# Patient Record
Sex: Male | Born: 1960
Health system: Southern US, Community
[De-identification: ages and names within clinical notes are randomized; demographics above are authoritative.]

## PROBLEM LIST (undated history)

## (undated) DIAGNOSIS — R7611 Nonspecific reaction to tuberculin skin test without active tuberculosis: Secondary | ICD-10-CM

## (undated) DIAGNOSIS — G4730 Sleep apnea, unspecified: Secondary | ICD-10-CM

## (undated) DIAGNOSIS — Z8739 Personal history of other diseases of the musculoskeletal system and connective tissue: Secondary | ICD-10-CM

## (undated) DIAGNOSIS — E7800 Pure hypercholesterolemia, unspecified: Secondary | ICD-10-CM

## (undated) DIAGNOSIS — E278 Other specified disorders of adrenal gland: Secondary | ICD-10-CM

## (undated) DIAGNOSIS — I4819 Other persistent atrial fibrillation: Secondary | ICD-10-CM

## (undated) DIAGNOSIS — I10 Essential (primary) hypertension: Secondary | ICD-10-CM

## (undated) DIAGNOSIS — J42 Unspecified chronic bronchitis: Secondary | ICD-10-CM

## (undated) DIAGNOSIS — N179 Acute kidney failure, unspecified: Secondary | ICD-10-CM

## (undated) DIAGNOSIS — J939 Pneumothorax, unspecified: Secondary | ICD-10-CM

## (undated) DIAGNOSIS — D649 Anemia, unspecified: Secondary | ICD-10-CM

## (undated) DIAGNOSIS — Z720 Tobacco use: Secondary | ICD-10-CM

## (undated) DIAGNOSIS — J439 Emphysema, unspecified: Secondary | ICD-10-CM

## (undated) DIAGNOSIS — I4891 Unspecified atrial fibrillation: Secondary | ICD-10-CM

## (undated) DIAGNOSIS — Z8719 Personal history of other diseases of the digestive system: Secondary | ICD-10-CM

## (undated) DIAGNOSIS — M1990 Unspecified osteoarthritis, unspecified site: Secondary | ICD-10-CM

## (undated) HISTORY — PX: CHEST TUBE INSERTION: SHX231

## (undated) HISTORY — DX: Other persistent atrial fibrillation: I48.19

## (undated) HISTORY — DX: Pure hypercholesterolemia, unspecified: E78.00

## (undated) HISTORY — PX: CARDIAC CATHETERIZATION: SHX172

## (undated) HISTORY — DX: Pneumothorax, unspecified: J93.9

## (undated) HISTORY — PX: ADRENAL GLAND SURGERY: SHX544

## (undated) HISTORY — DX: Other specified disorders of adrenal gland: E27.8

## (undated) HISTORY — PX: ESOPHAGOGASTRODUODENOSCOPY: SHX1529

## (undated) HISTORY — PX: FINGER SURGERY: SHX640

## (undated) HISTORY — DX: Unspecified atrial fibrillation: I48.91

## (undated) HISTORY — DX: Tobacco use: Z72.0

## (undated) HISTORY — DX: Sleep apnea, unspecified: G47.30

## (undated) HISTORY — DX: Unspecified osteoarthritis, unspecified site: M19.90

---
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
## 1994-12-08 HISTORY — PX: INGUINAL HERNIA REPAIR: SUR1180

## 2002-07-15 ENCOUNTER — Emergency Department (HOSPITAL_COMMUNITY): Admission: EM | Admit: 2002-07-15 | Discharge: 2002-07-15 | Payer: Self-pay | Admitting: Emergency Medicine

## 2016-02-21 ENCOUNTER — Emergency Department (HOSPITAL_COMMUNITY)
Admission: EM | Admit: 2016-02-21 | Discharge: 2016-02-21 | Disposition: A | Discharge status: Home / Self Care | Payer: BLUE CROSS/BLUE SHIELD | Attending: Emergency Medicine | Admitting: Emergency Medicine

## 2016-02-21 ENCOUNTER — Encounter (HOSPITAL_COMMUNITY): Payer: Self-pay | Admitting: Emergency Medicine

## 2016-02-21 ENCOUNTER — Observation Stay (HOSPITAL_BASED_OUTPATIENT_CLINIC_OR_DEPARTMENT_OTHER)
Admission: EM | Admit: 2016-02-21 | Discharge: 2016-02-24 | Disposition: A | Discharge status: Home / Self Care | Payer: BLUE CROSS/BLUE SHIELD | Source: Home / Self Care | Attending: Emergency Medicine | Admitting: Emergency Medicine

## 2016-02-21 ENCOUNTER — Encounter (HOSPITAL_COMMUNITY): Payer: Self-pay

## 2016-02-21 DIAGNOSIS — R040 Epistaxis: Secondary | ICD-10-CM

## 2016-02-21 DIAGNOSIS — Z7982 Long term (current) use of aspirin: Secondary | ICD-10-CM | POA: Insufficient documentation

## 2016-02-21 DIAGNOSIS — I10 Essential (primary) hypertension: Secondary | ICD-10-CM | POA: Diagnosis present

## 2016-02-21 DIAGNOSIS — E669 Obesity, unspecified: Secondary | ICD-10-CM | POA: Insufficient documentation

## 2016-02-21 DIAGNOSIS — M549 Dorsalgia, unspecified: Secondary | ICD-10-CM | POA: Insufficient documentation

## 2016-02-21 DIAGNOSIS — Z79899 Other long term (current) drug therapy: Secondary | ICD-10-CM | POA: Insufficient documentation

## 2016-02-21 DIAGNOSIS — R51 Headache: Secondary | ICD-10-CM | POA: Diagnosis not present

## 2016-02-21 DIAGNOSIS — IMO0001 Reserved for inherently not codable concepts without codable children: Secondary | ICD-10-CM

## 2016-02-21 DIAGNOSIS — F17210 Nicotine dependence, cigarettes, uncomplicated: Secondary | ICD-10-CM | POA: Insufficient documentation

## 2016-02-21 HISTORY — DX: Essential (primary) hypertension: I10

## 2016-02-21 LAB — CBC
HEMATOCRIT: 44.7 % (ref 39.0–52.0)
Hemoglobin: 14.5 g/dL (ref 13.0–17.0)
MCH: 28.7 pg (ref 26.0–34.0)
MCHC: 32.4 g/dL (ref 30.0–36.0)
MCV: 88.5 fL (ref 78.0–100.0)
Platelets: 245 10*3/uL (ref 150–400)
RBC: 5.05 MIL/uL (ref 4.22–5.81)
RDW: 14.1 % (ref 11.5–15.5)
WBC: 8.7 10*3/uL (ref 4.0–10.5)

## 2016-02-21 LAB — COMPREHENSIVE METABOLIC PANEL
ALBUMIN: 4.4 g/dL (ref 3.5–5.0)
ALT: 21 U/L (ref 17–63)
ANION GAP: 11 (ref 5–15)
AST: 22 U/L (ref 15–41)
Alkaline Phosphatase: 58 U/L (ref 38–126)
BILIRUBIN TOTAL: 0.8 mg/dL (ref 0.3–1.2)
BUN: 12 mg/dL (ref 6–20)
CO2: 25 mmol/L (ref 22–32)
Calcium: 9.5 mg/dL (ref 8.9–10.3)
Chloride: 103 mmol/L (ref 101–111)
Creatinine, Ser: 1 mg/dL (ref 0.61–1.24)
GFR calc non Af Amer: >60 mL/min (ref >60)
GLUCOSE: 93 mg/dL (ref 65–99)
POTASSIUM: 4 mmol/L (ref 3.5–5.1)
SODIUM: 139 mmol/L (ref 135–145)
TOTAL PROTEIN: 6.9 g/dL (ref 6.5–8.1)

## 2016-02-21 LAB — CBC WITH DIFFERENTIAL/PLATELET
BASOS ABS: 0.1 10*3/uL (ref 0.0–0.1)
Basophils Relative: 1 %
Eosinophils Absolute: 0.2 10*3/uL (ref 0.0–0.7)
Eosinophils Relative: 2 %
HEMATOCRIT: 43.3 % (ref 39.0–52.0)
HEMOGLOBIN: 14 g/dL (ref 13.0–17.0)
LYMPHS PCT: 26 %
Lymphs Abs: 3.1 10*3/uL (ref 0.7–4.0)
MCH: 28.2 pg (ref 26.0–34.0)
MCHC: 32.3 g/dL (ref 30.0–36.0)
MCV: 87.1 fL (ref 78.0–100.0)
MONO ABS: 0.7 10*3/uL (ref 0.1–1.0)
Monocytes Relative: 6 %
NEUTROS ABS: 7.8 10*3/uL — AB (ref 1.7–7.7)
NEUTROS PCT: 65 %
Platelets: 270 10*3/uL (ref 150–400)
RBC: 4.97 MIL/uL (ref 4.22–5.81)
RDW: 14.1 % (ref 11.5–15.5)
WBC: 11.9 10*3/uL — ABNORMAL HIGH (ref 4.0–10.5)

## 2016-02-21 LAB — PROTIME-INR
INR: 1.15 (ref 0.00–1.49)
Prothrombin Time: 14.9 seconds (ref 11.6–15.2)

## 2016-02-21 LAB — TYPE AND SCREEN
ABO/RH(D): O NEG
ANTIBODY SCREEN: NEGATIVE

## 2016-02-21 LAB — ABO/RH: ABO/RH(D): O NEG

## 2016-02-21 MED ORDER — LIDOCAINE HCL 2 % EX GEL
1.0000 "application " | Freq: Once | CUTANEOUS | Status: DC | PRN
  Filled 2016-02-21: qty 11

## 2016-02-21 MED ORDER — SILVER NITRATE-POT NITRATE 75-25 % EX MISC
1.0000 "application " | Freq: Once | CUTANEOUS | Status: DC
  Filled 2016-02-21: qty 1

## 2016-02-21 MED ORDER — LIDOCAINE HCL 4 % EX SOLN
0.0000 mL | Freq: Once | CUTANEOUS | Status: AC | PRN
  Administered 2016-02-21: 10 mL via TOPICAL
  Filled 2016-02-21: qty 50

## 2016-02-21 MED ORDER — LISINOPRIL 20 MG PO TABS
20.0000 mg | ORAL_TABLET | Freq: Once | ORAL | Status: AC
  Administered 2016-02-21: 20 mg via ORAL
  Filled 2016-02-21 (×2): qty 1

## 2016-02-21 MED ORDER — LIDOCAINE-EPINEPHRINE (PF) 1 %-1:200000 IJ SOLN
0.0000 mL | Freq: Once | INTRAMUSCULAR | Status: DC | PRN
  Filled 2016-02-21: qty 30

## 2016-02-21 MED ORDER — TRIPLE ANTIBIOTIC 3.5-400-5000 EX OINT
1.0000 "application " | TOPICAL_OINTMENT | Freq: Once | CUTANEOUS | Status: DC | PRN
  Filled 2016-02-21: qty 1

## 2016-02-21 MED ORDER — OXYMETAZOLINE HCL 0.05 % NA SOLN
1.0000 | Freq: Once | NASAL | Status: AC
  Administered 2016-02-21: 1 via NASAL
  Filled 2016-02-21: qty 15

## 2016-02-21 MED ORDER — HYDROCHLOROTHIAZIDE 12.5 MG PO CAPS
12.5000 mg | ORAL_CAPSULE | Freq: Once | ORAL | Status: AC
  Administered 2016-02-21: 12.5 mg via ORAL
  Filled 2016-02-21: qty 1

## 2016-02-21 MED ORDER — HYDROCHLOROTHIAZIDE 25 MG PO TABS: 25.0000 mg | ORAL_TABLET | Freq: Every day | ORAL | Status: DC

## 2016-02-21 MED ORDER — LISINOPRIL 40 MG PO TABS: 40.0000 mg | ORAL_TABLET | Freq: Every day | ORAL | Status: DC

## 2016-02-21 MED ORDER — OXYMETAZOLINE HCL 0.05 % NA SOLN: 1.0000 | Freq: Once | NASAL | Status: DC | PRN

## 2016-02-21 MED ORDER — SILVER NITRATE-POT NITRATE 75-25 % EX MISC
10.0000 | Freq: Once | CUTANEOUS | Status: DC | PRN
  Filled 2016-02-21: qty 10

## 2016-02-21 NOTE — ED Notes (Signed)
Pt. reports persistent epistaxis since 3 am this morning , denies nasal injury  or anticoagulant medication , treated at [facility] this afternoon with no relief.

## 2016-02-21 NOTE — Progress Notes (Signed)
Pt confirms he does not have a [facility] pcp  CM provided a list of [facility] pcps in network to assist with him obtaining a provider for f/u

## 2016-02-21 NOTE — ED Provider Notes (Signed)
CSN: [id number]     Arrival date & time 02/21/16  1225 History   First MD Initiated Contact with Patient 02/21/16 1252     Chief Complaint  Patient presents with  . Epistaxis     Patient is a 55 y.o. male presenting with nosebleeds. The history is provided by the patient.  Epistaxis  patient presents with nosebleed. Mostly on the left side but also goes down his throat and out the right side. Has been passing clots. Began last night was continued on off today. No real history of  Nosebleeds. No lightheadedness or dizziness. No difficulty breathing. He does have a history of hypertension. He has been out of his blood pressure medicines for month.  He has been taking aspirin for his back pain.  Past Medical History  Diagnosis Date  . Hypertension    Past Surgical History  Procedure Laterality Date  . Adrenal gland surgery     History reviewed. No pertinent family history. Social History  Substance Use Topics  . Smoking status: Current Every Day Smoker -- 1.00 packs/day    Types: Cigarettes  . Smokeless tobacco: None  . Alcohol Use: Yes    Review of Systems  Constitutional: Negative for appetite change.  HENT: Positive for nosebleeds.   Respiratory: Negative for shortness of breath.   Cardiovascular: Negative for chest pain.  Musculoskeletal: Positive for back pain.  Skin: Negative for wound.      Allergies  Review of patient's allergies indicates no known allergies.  Home Medications   Prior to Admission medications   Medication Sig Start Date End Date Taking? Authorizing Provider  Aspirin-Caffeine (BAYER BACK & BODY PAIN EX ST) 500-32.5 MG TABS Take 3 tablets by mouth daily.   Yes Historical Provider, MD  ECHINACEA PO Take 3 tablets by mouth daily as needed (immune system support.).   Yes Historical Provider, MD  hydrochlorothiazide (HYDRODIURIL) 25 MG tablet Take 1 tablet (25 mg total) by mouth daily. 02/21/16   Benjiman CoreNathan Keaundre Thelin, MD  lisinopril (PRINIVIL,ZESTRIL) 40  MG tablet Take 1 tablet (40 mg total) by mouth daily. 02/21/16   Benjiman CoreNathan Jakayden Cancio, MD   BP 161/109 mmHg  Pulse 66  Temp(Src) 98.4 F (36.9 C) (Oral)  Resp 18  SpO2 99% Physical Exam  Constitutional: He appears well-developed.  HENT:  Head: Atraumatic.   Patient with blood coming from bilateral nostrils and out of his left eye. Also some blood in posterior pharynx. Afrin placed after blowing out clots. No active bleeding seen.  Cardiovascular: Normal rate.   Pulmonary/Chest: Effort normal.  Abdominal: Soft. There is no tenderness.  Neurological: He is alert.  Skin: Skin is warm.    ED Course  .Epistaxis Management Date/Time: 02/21/2016 4:13 PM Performed by: Benjiman CorePICKERING, Nakoma Gotwalt Authorized by: Benjiman CorePICKERING, Haydee Jabbour Consent: Verbal consent obtained. Risks and benefits: risks, benefits and alternatives were discussed Patient understanding: patient states understanding of the procedure being performed Required items: required blood products, implants, devices, and special equipment available Patient sedated: no Treatment site: left anterior and left posterior Repair method: nasal balloon Post-procedure assessment: bleeding decreased Treatment complexity: complex Recurrence: recurrence of recent bleed Comments: Continued bleeding after placement of 7.5, 4.5 and then 9.5 cm AP ballons   (including critical care time) Labs Review Labs Reviewed  CBC    Imaging Review No results found. I have personally reviewed and evaluated these images and lab results as part of my medical decision-making.   EKG Interpretation None      MDM   Final  diagnoses:  Epistaxis  Essential hypertension    Patient with epistaxis. In left nostril. No visible site of bleeding after Afrin placed. Then 7.5 c rapid rhino in place. Continued bleeding anteriorly. Placed 7.4.5 centimeter anterior rapid Rhino. Then had more posterior and right-sided bleeding. This balloon was removed and a 9.5 sooner anterior  posterior balloon was placed. He continued to have some bleeding. Will discuss with ENT. Also hypertensive. Has been off his medications. Will now do oral antihypertensives and help to bring this down.    Benjiman Core, MD 02/21/16 [phone]

## 2016-02-21 NOTE — Consult Note (Signed)
  Reason for Consult: Recurrent epistaxis Referring Physician: No att. providers found  [name] is an 54 y.o. male.  HPI: 2 day history of chronic and recurring epistaxis from the left. He has been treated all day long in the emergency department without relief. It started on the left and was anterior at first. The initial treatment with a cottonball and Afrin was very effective. When he had that removed blood again so we had a balloon placed which did not seem to help. He is not on blood thinner. He does not use any nasal sprays. He does smoke 1 pack per day.  Past Medical History  Diagnosis Date  . Hypertension     Past Surgical History  Procedure Laterality Date  . Adrenal gland surgery      History reviewed. No pertinent family history.  Social History:  reports that he has been smoking Cigarettes.  He has been smoking about 1.00 pack per day. He does not have any smokeless tobacco history on file. He reports that he drinks alcohol. He reports that he does not use illicit drugs.  Allergies: No Known Allergies  Medications: Reviewed  Results for orders placed or performed during the hospital encounter of 02/21/16 (from the past 48 hour(s))  CBC     Status: None   Collection Time: 02/21/16  1:06 PM  Result Value Ref Range   WBC 8.7 4.0 - 10.5 K/uL   RBC 5.05 4.22 - 5.81 MIL/uL   Hemoglobin 14.5 13.0 - 17.0 g/dL   HCT 44.7 39.0 - 52.0 %   MCV 88.5 78.0 - 100.0 fL   MCH 28.7 26.0 - 34.0 pg   MCHC 32.4 30.0 - 36.0 g/dL   RDW 14.1 11.5 - 15.5 %   Platelets 245 150 - 400 K/uL    No results found.  ROS:Negative except as listed in admit H&P  Blood pressure 197/126, pulse 73, temperature 98.4 F (36.9 C), temperature source Oral, resp. rate 18, SpO2 99 %.  PHYSICAL EXAM: Overall appearance:  Healthy appearing, in no distress. A lot of dried blood over his shirt and some on his face. Head:  Normocephalic, atraumatic. Ears: External ears look healthy. Nose: External  nose is healthy in appearance. Internal nasal exam reveals bilateral cotton packing which was removed. Topical Afrin/Xylocaine was applied to the nasal cavities for anesthesia and decongestant. A thorough survey of the nasal cavities did not reveal any obvious granuloma or exposed vessels. There is no further bleeding once all of the clot was suctioned out. There is a slight inflamed area of the left anteroinferior septum which bled slightly with manipulation with a suction and this was cauterized with silver nitrate. A Slimline Merocel was then placed and inflated with the Afrin/Xylocaine solution. Oral Cavity/Pharynx:  There are no mucosal lesions or masses identified. Larynx/Hypopharynx: Deferred Neuro:  No identifiable neurologic deficits. Neck: No palpable neck masses.  Studies Reviewed: none  Procedures:  Nasal cautery, packing of left nasal cavity under local anesthetic. Procedure described above.  Assessment/Plan: Recurring epistaxis, bleeding site not identified. No further bleeding during my evaluation. Packing placed on the left. Follow-up in the office in 5 days for removal of packing or sooner if he has any further bleeding. Recommend he try to stop smoking.  Author [place] 02/21/2016, 6:35 PM      

## 2016-02-21 NOTE — Discharge Instructions (Signed)
Hypertension °Hypertension, commonly called high blood pressure, is when the force of blood pumping through your arteries is too strong. Your arteries are the blood vessels that carry blood from your heart throughout your body. A blood pressure reading consists of a higher number over a lower number, such as 110/72. The higher number (systolic) is the pressure inside your arteries when your heart pumps. The lower number (diastolic) is the pressure inside your arteries when your heart relaxes. Ideally you want your blood pressure below 120/80. °Hypertension forces your heart to work harder to pump blood. Your arteries may become narrow or stiff. Having untreated or uncontrolled hypertension can cause heart attack, stroke, kidney disease, and other problems. °RISK FACTORS °Some risk factors for high blood pressure are controllable. Others are not.  °Risk factors you cannot control include:  °· Race. You may be at higher risk if you are African American. °· Age. Risk increases with age. °· Gender. Men are at higher risk than women before age 45 years. After age 65, women are at higher risk than men. °Risk factors you can control include: °· Not getting enough exercise or physical activity. °· Being overweight. °· Getting too much fat, sugar, calories, or salt in your diet. °· Drinking too much alcohol. °SIGNS AND SYMPTOMS °Hypertension does not usually cause signs or symptoms. Extremely high blood pressure (hypertensive crisis) may cause headache, anxiety, shortness of breath, and nosebleed. °DIAGNOSIS °To check if you have hypertension, your health care provider will measure your blood pressure while you are seated, with your arm held at the level of your heart. It should be measured at least twice using the same arm. Certain conditions can cause a difference in blood pressure between your right and left arms. A blood pressure reading that is higher than normal on one occasion does not mean that you need treatment. If  it is not clear whether you have high blood pressure, you may be asked to return on a different day to have your blood pressure checked again. Or, you may be asked to monitor your blood pressure at home for 1 or more weeks. °TREATMENT °Treating high blood pressure includes making lifestyle changes and possibly taking medicine. Living a healthy lifestyle can help lower high blood pressure. You may need to change some of your habits. °Lifestyle changes may include: °· Following the DASH diet. This diet is high in fruits, vegetables, and whole grains. It is low in salt, red meat, and added sugars. °· Keep your sodium intake below 2,300 mg per day. °· Getting at least 30-45 minutes of aerobic exercise at least 4 times per week. °· Losing weight if necessary. °· Not smoking. °· Limiting alcoholic beverages. °· Learning ways to reduce stress. °Your health care provider may prescribe medicine if lifestyle changes are not enough to get your blood pressure under control, and if one of the following is true: °· You are 18-59 years of age and your systolic blood pressure is above 140. °· You are 60 years of age or older, and your systolic blood pressure is above 150. °· Your diastolic blood pressure is above 90. °· You have diabetes, and your systolic blood pressure is over 140 or your diastolic blood pressure is over 90. °· You have kidney disease and your blood pressure is above 140/90. °· You have heart disease and your blood pressure is above 140/90. °Your personal target blood pressure may vary depending on your medical conditions, your age, and other factors. °HOME CARE INSTRUCTIONS °·   Have your blood pressure rechecked as directed by your health care provider.   °· Take medicines only as directed by your health care provider. Follow the directions carefully. Blood pressure medicines must be taken as prescribed. The medicine does not work as well when you skip doses. Skipping doses also puts you at risk for  problems. °· Do not smoke.   °· Monitor your blood pressure at home as directed by your health care provider.  °SEEK MEDICAL CARE IF:  °· You think you are having a reaction to medicines taken. °· You have recurrent headaches or feel dizzy. °· You have swelling in your ankles. °· You have trouble with your vision. °SEEK IMMEDIATE MEDICAL CARE IF: °· You develop a severe headache or confusion. °· You have unusual weakness, numbness, or feel faint. °· You have severe chest or abdominal pain. °· You vomit repeatedly. °· You have trouble breathing. °MAKE SURE YOU:  °· Understand these instructions. °· Will watch your condition. °· Will get help right away if you are not doing well or get worse. °  °This information is not intended to replace advice given to you by your health care provider. Make sure you discuss any questions you have with your health care provider. °  °Document Released: 11/24/2005 Document Revised: 04/10/2015 Document Reviewed: 09/16/2013 °Elsevier Interactive Patient Education ©2016 Else[employer] Inc[employer] ° °Nosebleed °Nosebleeds are common. They are due to a crack in the inside lining of your nose (mucous membrane) or from a small blood vessel that starts to bleed. Nosebleeds can be caused by many conditions, such as injury, infections, dry mucous membranes or dry climate, medicines, nose picking, and home heating and cooling systems. Most nosebleeds come from blood vessels in the front of your nose. °HOME CARE INSTRUCTIONS  °· Try controlling your nosebleed by pinching your nostrils gently and continuously for at least 10 minutes. °· Avoid blowing or sniffing your nose for a number of hours after having a nosebleed. °· Do not put gauze inside your nose yourself. If your nose was packed by your health care provider, try to maintain the pack inside of your nose until your health care provider removes it. °¨ If a gauze pack was used and it starts to fall out, gently replace it or cut off the end of it. °¨ If a  balloon catheter was used to pack your nose, do not cut or remove it unless your health care provider has instructed you to do that. °· Avoid lying down while you are having a nosebleed. Sit up and lean forward. °· Use a nasal spray decongestant to help with a nosebleed as directed by your health care provider. °· Do not use petroleum jelly or mineral oil in your nose. These can drip into your lungs. °· Maintain humidity in your home by using less air conditioning or by using a humidifier. °· Aspirin and blood thinners make bleeding more likely. If you are prescribed these medicines and you suffer from nosebleeds, ask your health care provider if you should stop taking the medicines or adjust the dose. Do not stop medicines unless directed by your health care provider °· Resume your normal activities as you are able, but avoid straining, lifting, or bending at the waist for several days. °· If your nosebleed was caused by dry mucous membranes, use over-the-counter saline nasal spray or gel. This will keep the mucous membranes moist and allow them to heal. If you must use a lubricant, choose the water-soluble variety. Use it only sparingly, and   do not use it within several hours of lying down. °· Keep all follow-up visits as directed by your health care provider. This is important. °SEEK MEDICAL CARE IF: °· You have a fever. °· You get frequent nosebleeds. °· You are getting nosebleeds more often. °SEEK IMMEDIATE MEDICAL CARE IF: °· Your nosebleed lasts longer than 20 minutes. °· Your nosebleed occurs after an injury to your face, and your nose looks crooked or broken. °· You have unusual bleeding from other parts of your body. °· You have unusual bruising on other parts of your body. °· You feel light-headed or you faint. °· You become sweaty. °· You vomit blood. °· Your nosebleed occurs after a head injury. °  °This information is not intended to replace advice given to you by your health care provider. Make sure  you discuss any questions you have with your health care provider. °  °Document Released: 09/03/2005 Document Revised: 12/15/2014 Document Reviewed: 07/10/2014 °Elsevier Interactive Patient Education ©2016 Elsevier Inc. ° °

## 2016-02-21 NOTE — ED Notes (Signed)
Pt reports taking 3 full strength ASA daily

## 2016-02-21 NOTE — ED Provider Notes (Signed)
CSN: [id number]     Arrival date & time 02/21/16  2030 History   First MD Initiated Contact with Patient 02/21/16 2037     Chief Complaint  Patient presents with  . Epistaxis   [name] is a 55 y.o. male who presents to the emergency department complaining of a nosebleed since 3 AM this morning. The patient reports nosebleeds started spontaneously this morning around 3 AM. He was seen and evaluated at the [facility] emergency department and discharged.  ENT Dr. Pollyann Kennedy evaluated and cauterized an area in his left ear. Patient reports when he was discharged from [facility] long nosebleed stopped. He reports approximately an hour after discharge is nosebleed restarted and has continued since discharge.  He reports he has had continuous bleeding out of his left near. No nasal trauma noted. He is not on anticoagulants. He does report taking aspirin 3 times a day for his back pain. He does have a history of hypertension has not been taking his blood pressure medicines because he is been out. He denies  Fevers, lightheadedness, dizziness, chest pain, shortness of breath, easy bruising, gum bleeding, trouble swallowing.    The history is provided by the patient. No language interpreter was used.    Past Medical History  Diagnosis Date  . Hypertension    Past Surgical History  Procedure Laterality Date  . Adrenal gland surgery     History reviewed. No pertinent family history. Social History  Substance Use Topics  . Smoking status: Current Every Day Smoker -- 1.00 packs/day    Types: Cigarettes  . Smokeless tobacco: None  . Alcohol Use: Yes    Review of Systems  Constitutional: Negative for fever and chills.  HENT: Positive for nosebleeds. Negative for congestion and sore throat.   Eyes: Negative for visual disturbance.  Respiratory: Negative for cough and shortness of breath.   Cardiovascular: Negative for chest pain.  Gastrointestinal: Negative for nausea, vomiting, abdominal pain and  diarrhea.  Genitourinary: Negative for dysuria.  Musculoskeletal: Negative for back pain and neck pain.  Skin: Negative for rash.  Neurological: Negative for dizziness, syncope, light-headedness and headaches.      Allergies  Review of patient's allergies indicates no known allergies.  Home Medications   Prior to Admission medications   Medication Sig Start Date End Date Taking? Authorizing Provider  ECHINACEA PO Take 3 tablets by mouth daily as needed (immune system support.).   Yes Historical Provider, MD  hydrochlorothiazide (HYDRODIURIL) 25 MG tablet Take 1 tablet (25 mg total) by mouth daily. 02/21/16  Yes Benjiman Core, MD  lisinopril (PRINIVIL,ZESTRIL) 40 MG tablet Take 1 tablet (40 mg total) by mouth daily. 02/21/16  Yes Benjiman Core, MD   BP 158/94 mmHg  Pulse 88  Resp 24  SpO2 93% Physical Exam  Constitutional: He appears well-developed and well-nourished. No distress.   Nontoxic appearing.  HENT:  Head: Normocephalic and atraumatic.  Right Ear: External ear normal.  Left Ear: External ear normal.  Mouth/Throat: Oropharynx is clear and moist.   Dried blood in his right and left near. No active nosebleed noted. No blood in his posterior oropharynx. Airway is patent.  Eyes: Conjunctivae are normal. Pupils are equal, round, and reactive to light. Right eye exhibits no discharge. Left eye exhibits no discharge.  Neck: Neck supple.  Cardiovascular: Normal rate, regular rhythm, normal heart sounds and intact distal pulses.  Exam reveals no gallop and no friction rub.   No murmur heard.  Heart rate is 80.  Pulmonary/Chest: Effort normal and breath sounds normal. No respiratory distress. He has no wheezes. He has no rales.  Abdominal: Soft. There is no tenderness.  Musculoskeletal: He exhibits no edema.  Lymphadenopathy:    He has no cervical adenopathy.  Neurological: He is alert. Coordination normal.  Skin: Skin is warm and dry. No rash noted. He is not  diaphoretic. No erythema. No pallor.  Psychiatric: He has a normal mood and affect. His behavior is normal.  Nursing note and vitals reviewed.   ED Course  .Epistaxis Management Date/Time: 02/21/2016 9:30 PM Performed by: Everlene FarrierANSIE, Taquan Bralley Authorized by: Everlene FarrierANSIE, Alonte Wulff Consent: Verbal consent obtained. Risks and benefits: risks, benefits and alternatives were discussed Consent given by: patient Patient understanding: patient states understanding of the procedure being performed Patient consent: the patient's understanding of the procedure matches consent given Procedure consent: procedure consent matches procedure scheduled Relevant documents: relevant documents present and verified Test results: test results available and properly labeled Site marked: the operative site was marked Required items: required blood products, implants, devices, and special equipment available Patient identity confirmed: verbally with patient Time out: Immediately prior to procedure a "time out" was called to verify the correct patient, procedure, equipment, support staff and site/side marked as required. Preparation: Patient was prepped and draped in the usual sterile fashion. Patient sedated: no Repair method: suction Post-procedure assessment: bleeding stopped Treatment complexity: simple Recurrence: recurrence of recent bleed Patient tolerance: Patient tolerated the procedure well with no immediate complications   (including critical care time) Labs Review Labs Reviewed  CBC WITH DIFFERENTIAL/PLATELET - Abnormal; Notable for the following:    WBC 11.9 (*)    Neutro Abs 7.8 (*)    All other components within normal limits  COMPREHENSIVE METABOLIC PANEL  PROTIME-INR  TYPE AND SCREEN  ABO/RH    Imaging Review No results found. I have personally reviewed and evaluated these lab results as part of my medical decision-making.   EKG Interpretation None      Filed Vitals:   02/21/16 2215  02/22/16 0030 02/22/16 0115 02/22/16 0130  BP: 155/87 145/72 153/98 158/94  Pulse: 70 79 73 88  Resp:      SpO2: 100% 95% 93% 93%     MDM   Meds given in ED:  Medications - No data to display  Current Discharge Medication List      Final diagnoses:  Epistaxis   This is a [age] y.o. male who presents to the emergency department complaining of a nosebleed since 3 AM this morning. The patient reports nosebleeds started spontaneously this morning around 3 AM. [facility] and [facility].  ENT Dr. Pollyann Kennedyosen evaluated and cauterized an area in his left ear. Patient reports [facility] nosebleed stopped. He reports approximately an hour after discharge is nosebleed restarted and has continued since discharge.  He reports he has had continuous bleeding out of his left near. No nasal trauma noted. He is not on anticoagulants. He does report taking aspirin 3 times a day for his back pain.   on exam the patient is afebrile and nontoxic appearing. He is not tachycardic. He denies feeling lightheaded with position change. Initially patient was able to blow out a large clot and use Afrin and his bleeding stopped out of his left naris.  Patient's blood work is unremarkable. He has a stable  Hemoglobin and hematocrit.  Later, patient had recurrence of nosebleed. He appears to be  having a posterior nosebleed. I'm unable to identify the area of bleeding. I consulted with Dr. Pollyann Kennedy who evaluated the patient earlier. He advised me not to pack the patient and he would be anticipated patient.  Dr. Pollyann Kennedy evaluated the patient and he will take him to the ENT suite for further treatment of his nosebleed.    Everlene Farrier, PA-C 02/22/16 1610  Linwood Dibbles, MD 02/22/16 [phone]

## 2016-02-21 NOTE — Progress Notes (Signed)
Entered in d/c instructions Please go to [email or web address], locate find a doctor area in top right corner of page to use to find in network primary care provider and specialists or call the toll free number on the back of your insurance card to speak with a customer service staff Please use the resources provided to you in emergency room by case manager to assist with doctor for follow up Please verify any provider recommended to you is in network

## 2016-02-21 NOTE — ED Notes (Addendum)
Pt c/o epistaxis starting around 0300.  Denies pain.  Denies injury.  Hx of HTN.  Sts he ran out of HTN medication x 1 month ago.  Pt reports "large clots" w/ blowing nose.

## 2016-02-22 ENCOUNTER — Encounter (HOSPITAL_COMMUNITY)
Admission: EM | Disposition: A | Discharge status: Home / Self Care | Payer: Self-pay | Source: Home / Self Care | Attending: Emergency Medicine

## 2016-02-22 ENCOUNTER — Ambulatory Visit: Payer: Self-pay | Admitting: Otolaryngology

## 2016-02-22 ENCOUNTER — Emergency Department (HOSPITAL_COMMUNITY): Payer: BLUE CROSS/BLUE SHIELD | Admitting: Certified Registered"

## 2016-02-22 ENCOUNTER — Encounter (HOSPITAL_COMMUNITY): Payer: Self-pay | Admitting: Certified Registered"

## 2016-02-22 DIAGNOSIS — R040 Epistaxis: Secondary | ICD-10-CM | POA: Diagnosis present

## 2016-02-22 DIAGNOSIS — I10 Essential (primary) hypertension: Secondary | ICD-10-CM | POA: Diagnosis not present

## 2016-02-22 HISTORY — PX: NASAL HEMORRHAGE CONTROL: SHX287

## 2016-02-22 SURGERY — CONTROL OF EPISTAXIS
Anesthesia: General | Site: Nose

## 2016-02-22 MED ORDER — OXYMETAZOLINE HCL 0.05 % NA SOLN
NASAL | Status: AC
  Filled 2016-02-22: qty 15

## 2016-02-22 MED ORDER — SUCCINYLCHOLINE CHLORIDE 20 MG/ML IJ SOLN
INTRAMUSCULAR | Status: DC | PRN
Start: 2016-02-22 — End: 2016-02-22
  Administered 2016-02-22: 140 mg via INTRAVENOUS

## 2016-02-22 MED ORDER — LACTATED RINGERS IV SOLN
INTRAVENOUS | Status: DC | PRN
  Administered 2016-02-22: 02:00:00 via INTRAVENOUS

## 2016-02-22 MED ORDER — ALBUTEROL SULFATE HFA 108 (90 BASE) MCG/ACT IN AERS
INHALATION_SPRAY | RESPIRATORY_TRACT | Status: DC | PRN
  Administered 2016-02-22: 4 via RESPIRATORY_TRACT

## 2016-02-22 MED ORDER — HYDRALAZINE HCL 20 MG/ML IJ SOLN
10.0000 mg | INTRAMUSCULAR | Status: DC | PRN
  Administered 2016-02-22 – 2016-02-24 (×7): 10 mg via INTRAVENOUS
  Filled 2016-02-22 (×7): qty 1
  Filled 2016-02-22: qty 0.5

## 2016-02-22 MED ORDER — HYDROCHLOROTHIAZIDE 25 MG PO TABS
25.0000 mg | ORAL_TABLET | Freq: Every day | ORAL | Status: DC
  Administered 2016-02-22 – 2016-02-24 (×3): 25 mg via ORAL
  Filled 2016-02-22 (×3): qty 1

## 2016-02-22 MED ORDER — PROPOFOL 10 MG/ML IV BOLUS
INTRAVENOUS | Status: DC | PRN
  Administered 2016-02-22: 200 mg via INTRAVENOUS

## 2016-02-22 MED ORDER — ALBUTEROL SULFATE (2.5 MG/3ML) 0.083% IN NEBU
INHALATION_SOLUTION | RESPIRATORY_TRACT | Status: AC
  Administered 2016-02-22: 2.5 mg via RESPIRATORY_TRACT
  Filled 2016-02-22: qty 3

## 2016-02-22 MED ORDER — HYDROCODONE-ACETAMINOPHEN 5-325 MG PO TABS
1.0000 | ORAL_TABLET | ORAL | Status: DC | PRN
  Administered 2016-02-22 – 2016-02-24 (×10): 2 via ORAL
  Filled 2016-02-22 (×10): qty 2

## 2016-02-22 MED ORDER — HYDROMORPHONE HCL 1 MG/ML IJ SOLN
INTRAMUSCULAR | Status: AC
  Filled 2016-02-22: qty 1

## 2016-02-22 MED ORDER — LIDOCAINE HCL (CARDIAC) 20 MG/ML IV SOLN
INTRAVENOUS | Status: DC | PRN
  Administered 2016-02-22: 100 mg via INTRAVENOUS

## 2016-02-22 MED ORDER — HYDROMORPHONE HCL 1 MG/ML IJ SOLN
0.2500 mg | INTRAMUSCULAR | Status: DC | PRN
  Administered 2016-02-22 (×2): 0.5 mg via INTRAVENOUS

## 2016-02-22 MED ORDER — FENTANYL CITRATE (PF) 100 MCG/2ML IJ SOLN
INTRAMUSCULAR | Status: DC | PRN
  Administered 2016-02-22: 50 ug via INTRAVENOUS

## 2016-02-22 MED ORDER — DEXTROSE-NACL 5-0.9 % IV SOLN
INTRAVENOUS | Status: DC
Start: 2016-02-22 — End: 2016-02-23
  Administered 2016-02-22 – 2016-02-23 (×2): via INTRAVENOUS

## 2016-02-22 MED ORDER — ALBUTEROL SULFATE (2.5 MG/3ML) 0.083% IN NEBU: 2.5000 mg | INHALATION_SOLUTION | Freq: Four times a day (QID) | RESPIRATORY_TRACT | Status: DC | PRN

## 2016-02-22 MED ORDER — ONDANSETRON HCL 4 MG/2ML IJ SOLN
INTRAMUSCULAR | Status: DC | PRN
  Administered 2016-02-22: 4 mg via INTRAVENOUS

## 2016-02-22 MED ORDER — LISINOPRIL 40 MG PO TABS
40.0000 mg | ORAL_TABLET | Freq: Every day | ORAL | Status: DC
  Administered 2016-02-22 – 2016-02-24 (×3): 40 mg via ORAL
  Filled 2016-02-22 (×3): qty 1

## 2016-02-22 MED ORDER — PROMETHAZINE HCL 25 MG/ML IJ SOLN: 6.2500 mg | INTRAMUSCULAR | Status: DC | PRN

## 2016-02-22 MED ORDER — ONDANSETRON HCL 4 MG/2ML IJ SOLN
INTRAMUSCULAR | Status: AC
  Filled 2016-02-22: qty 2

## 2016-02-22 MED ORDER — GELATIN ABSORBABLE 12-7 MM EX MISC
CUTANEOUS | Status: DC | PRN
  Administered 2016-02-22: 1 via TOPICAL

## 2016-02-22 MED ORDER — FENTANYL CITRATE (PF) 250 MCG/5ML IJ SOLN
INTRAMUSCULAR | Status: AC
  Filled 2016-02-22: qty 5

## 2016-02-22 MED ORDER — PROMETHAZINE HCL 25 MG RE SUPP: 25.0000 mg | Freq: Four times a day (QID) | RECTAL | Status: DC | PRN

## 2016-02-22 MED ORDER — IBUPROFEN 100 MG/5ML PO SUSP
400.0000 mg | Freq: Four times a day (QID) | ORAL | Status: DC | PRN
  Filled 2016-02-22: qty 20

## 2016-02-22 MED ORDER — PROPOFOL 10 MG/ML IV BOLUS
INTRAVENOUS | Status: AC
  Filled 2016-02-22: qty 20

## 2016-02-22 MED ORDER — ALBUTEROL SULFATE HFA 108 (90 BASE) MCG/ACT IN AERS
INHALATION_SPRAY | RESPIRATORY_TRACT | Status: AC
  Filled 2016-02-22: qty 13.4

## 2016-02-22 MED ORDER — PROMETHAZINE HCL 25 MG PO TABS: 25.0000 mg | ORAL_TABLET | Freq: Four times a day (QID) | ORAL | Status: DC | PRN

## 2016-02-22 SURGICAL SUPPLY — 28 items
BLADE SURG 15 STRL LF DISP TIS (BLADE) IMPLANT
BLADE SURG 15 STRL SS (BLADE)
CANISTER SUCTION 2500CC (MISCELLANEOUS) ×2 IMPLANT
COAGULATOR SUCT 8FR VV (MISCELLANEOUS) ×2 IMPLANT
COAGULATOR SUCT SWTCH 10FR 6 (ELECTROSURGICAL) ×2 IMPLANT
COVER MAYO STAND STRL (DRAPES) ×2 IMPLANT
COVER TABLE BACK 60X90 (DRAPES) ×2 IMPLANT
DRAPE PROXIMA HALF (DRAPES) IMPLANT
DRESSING NASAL POPE 10X1.5X2.5 (GAUZE/BANDAGES/DRESSINGS) ×1 IMPLANT
DRSG NASAL POPE 10X1.5X2.5 (GAUZE/BANDAGES/DRESSINGS) ×2
GAUZE SPONGE 2X2 8PLY STRL LF (GAUZE/BANDAGES/DRESSINGS) ×1 IMPLANT
GAUZE SPONGE 4X4 16PLY XRAY LF (GAUZE/BANDAGES/DRESSINGS) ×2 IMPLANT
GAUZE VASELINE FOILPK 1/2 X 72 (GAUZE/BANDAGES/DRESSINGS) IMPLANT
GLOVE ECLIPSE 7.5 STRL STRAW (GLOVE) ×2 IMPLANT
GOWN STRL REUS W/ TWL LRG LVL3 (GOWN DISPOSABLE) ×2 IMPLANT
GOWN STRL REUS W/TWL LRG LVL3 (GOWN DISPOSABLE) ×4
KIT BASIN OR (CUSTOM PROCEDURE TRAY) ×2 IMPLANT
KIT ROOM TURNOVER OR (KITS) ×2 IMPLANT
NEEDLE 27GAX1X1/2 (NEEDLE) ×2 IMPLANT
NS IRRIG 1000ML POUR BTL (IV SOLUTION) ×2 IMPLANT
PAD ARMBOARD 7.5X6 YLW CONV (MISCELLANEOUS) ×4 IMPLANT
PATTIES SURGICAL .5 X3 (DISPOSABLE) ×2 IMPLANT
SPLINT NASAL THERMO PLAST (MISCELLANEOUS) ×2 IMPLANT
SPONGE GAUZE 2X2 STER 10/PKG (GAUZE/BANDAGES/DRESSINGS) ×1
SYR CONTROL 10ML LL (SYRINGE) ×2 IMPLANT
TOWEL OR 17X24 6PK STRL BLUE (TOWEL DISPOSABLE) ×4 IMPLANT
TUBE CONNECTING 12X1/4 (SUCTIONS) ×2 IMPLANT
WATER STERILE IRR 1000ML POUR (IV SOLUTION) ×2 IMPLANT

## 2016-02-22 NOTE — Consult Note (Signed)
[facility] Medical Consultation  Kayceon Oki ZHY:[id number] DOB: August 18, 1961 DOA: 02/21/2016 PCP: No PCP Per Patient   Requesting physician: Brynda Peon, MD Date of consultation: 02/22/2016 Reason for consultation: Uncontrolled hypertension  Impression/Recommendations  Epistaxis: Patient status post nasal cautery of the left nare with packing under anesthesia. - Per ENT  Essential Hypertension: Uncontrolled. Patient was recently given scripts for restarting on his home medications of hydrochlorothiazide and lisinopril. Patient denies any current issues with obtaining medications. - Continue hydrochlorothiazide 25 mg po daily and lisinopril 40 mg po daily - Hydralazine 10 mg prn SBP >180 of DBP >110 - Patient needs to follow with his primary care provider within 1 week of discharge and will need labwork of BMP  Tobacco abuse  I will followup again tomorrow. Please contact me if I can be of assistance in the meanwhile. Thank you for this consultation.  Chief Complaint: Recurrent left sided nosebleed  HPI:  Mr. Stutz is a 55 year old male with past medical history significant for hypertension and obesity; who presented to the emergency department after 2 day history of recurrent left-sided nosebleed. Patient was tried on several treatments including applying pressure, cottonball, and afrin. He came to the emergency department for further evaluation. ENT ended up having to be called and patient underwent nasal cautery with packing of the left nasal cavity under local anesthesia. Blood pressure following the procedure was noted to be uncontrolled as high as 199/146. ENT consulted [place] for management of blood pressure. Patient reports a history of smoking 1 pack cigarettes per day. He reports being unable to follow-up to get refills on his blood pressure medications the last month due to fractured leg. Previously reports blood pressure being uncontrolled on hydrochlorothiazide 12.5 mg and  lisinopril 20 mg. He was restarted on these medications today and written scripts for a increased dose of his hydrochlorothiazide 25 mg and lisinopril 40 mg.   Review of Systems  HENT: Positive for nosebleeds.      Past Medical History  Diagnosis Date  . Hypertension    Past Surgical History  Procedure Laterality Date  . Adrenal gland surgery     Social History:  reports that he has been smoking Cigarettes.  He has been smoking about 1.00 pack per day. He does not have any smokeless tobacco history on file. He reports that he drinks alcohol. He reports that he does not use illicit drugs.  No Known Allergies History reviewed. No pertinent family history.  Prior to Admission medications   Medication Sig Start Date End Date Taking? Authorizing Provider  hydrochlorothiazide (HYDRODIURIL) 25 MG tablet Take 1 tablet (25 mg total) by mouth daily. 02/21/16  Yes Benjiman Core, MD  lisinopril (PRINIVIL,ZESTRIL) 40 MG tablet Take 1 tablet (40 mg total) by mouth daily. 02/21/16  Yes Benjiman Core, MD   Physical Exam: Blood pressure 177/90, pulse 83, temperature 99.2 F (37.3 C), temperature source Oral, resp. rate 18, SpO2 100 %. Filed Vitals:   02/22/16 0313 02/22/16 0333  BP: 178/92 177/90  Pulse: 91 83  Temp: 97.3 F (36.3 C) 99.2 F (37.3 C)  Resp: 17 18    Constitutional: Vital signs reviewed. Patient is a well-developed and well-nourished in no acute distress and cooperative with exam. Alert and oriented x3.  Head: Normocephalic and atraumatic  Ear: TM normal bilaterally  Nose:  nasal packing present in the left nare Mouth: no erythema or exudates, MMM  Eyes: PERRL, EOMI, conjunctivae normal, No scleral icterus.  Neck: Supple, Trachea midline normal ROM,  No JVD, mass, thyromegaly, or carotid bruit present.  Cardiovascular: RRR, S1 normal, S2 normal, no MRG, pulses symmetric and intact bilaterally  Pulmonary/Chest: CTAB, no wheezes, rales, or rhonchi  Abdominal:  Soft. Non-tender, non-distended, bowel sounds are normal, no masses, organomegaly, or guarding present.  GU: no CVA tenderness Musculoskeletal: No joint deformities, erythema, or stiffness, ROM full and no nontender Ext: No edema and no cyanosis, pulses palpable bilaterally (DP and PT)  Hematology: no cervical, inginal, or axillary adenopathy.  Neurological: A&O x3, Strenght is normal and symmetric bilaterally, cranial nerve II-XII are grossly intact, no focal motor deficit, sensory intact to light touch bilaterally.  Skin: Warm, dry and intact. No rash, cyanosis, or clubbing.  Psychiatric: Normal mood and affect. speech and behavior is normal. Judgment and thought content normal. Cognition and memory are normal.   Labs on Admission:  Basic Metabolic Panel:  Recent Labs Lab 02/21/16 2115  NA 139  K 4.0  CL 103  CO2 25  GLUCOSE 93  BUN 12  CREATININE 1.00  CALCIUM 9.5   Liver Function Tests:  Recent Labs Lab 02/21/16 2115  AST 22  ALT 21  ALKPHOS 58  BILITOT 0.8  PROT 6.9  ALBUMIN 4.4   No results for input(s): LIPASE, AMYLASE in the last 168 hours. No results for input(s): AMMONIA in the last 168 hours. CBC:  Recent Labs Lab 02/21/16 1306 02/21/16 2115  WBC 8.7 11.9*  NEUTROABS  --  7.8*  HGB 14.5 14.0  HCT 44.7 43.3  MCV 88.5 87.1  PLT 245 270   Cardiac Enzymes: No results for input(s): CKTOTAL, CKMB, CKMBINDEX, TROPONINI in the last 168 hours. BNP: Invalid input(s): POCBNP CBG: No results for input(s): GLUCAP in the last 168 hours.  Radiological Exams on Admission: No results found.    Time spent: >45 minutes  Clydie BraunRondell A Ronella Plunk [facility] Pager [phone]  If 7PM-7AM, please contact night-coverage [email or web address] Password [email or web address] 02/22/2016, 3:36 AM

## 2016-02-22 NOTE — H&P (View-Only) (Signed)
  Reason for Consult: Recurrent epistaxis Referring Physician: No att. providers found  Juan BannisterMartin Sallas is an 55 y.o. male.  HPI: 2 day history of chronic and recurring epistaxis from the left. He has been treated all day long in the emergency department without relief. It started on the left and was anterior at first. The initial treatment with a cottonball and Afrin was very effective. When he had that removed blood again so we had a balloon placed which did not seem to help. He is not on blood thinner. He does not use any nasal sprays. He does smoke 1 pack per day.  Past Medical History  Diagnosis Date  . Hypertension     Past Surgical History  Procedure Laterality Date  . Adrenal gland surgery      History reviewed. No pertinent family history.  Social History:  reports that he has been smoking Cigarettes.  He has been smoking about 1.00 pack per day. He does not have any smokeless tobacco history on file. He reports that he drinks alcohol. He reports that he does not use illicit drugs.  Allergies: No Known Allergies  Medications: Reviewed  Results for orders placed or performed during the hospital encounter of 02/21/16 (from the past 48 hour(s))  CBC     Status: None   Collection Time: 02/21/16  1:06 PM  Result Value Ref Range   WBC 8.7 4.0 - 10.5 K/uL   RBC 5.05 4.22 - 5.81 MIL/uL   Hemoglobin 14.5 13.0 - 17.0 g/dL   HCT 09.844.7 11.939.0 - 14.752.0 %   MCV 88.5 78.0 - 100.0 fL   MCH 28.7 26.0 - 34.0 pg   MCHC 32.4 30.0 - 36.0 g/dL   RDW 82.914.1 56.211.5 - 13.015.5 %   Platelets 245 150 - 400 K/uL    No results found.  QMV:HQIONGEXROS:Negative except as listed in admit H&P  Blood pressure 197/126, pulse 73, temperature 98.4 F (36.9 C), temperature source Oral, resp. rate 18, SpO2 99 %.  PHYSICAL EXAM: Overall appearance:  Healthy appearing, in no distress. A lot of dried blood over his shirt and some on his face. Head:  Normocephalic, atraumatic. Ears: External ears look healthy. Nose: External  nose is healthy in appearance. Internal nasal exam reveals bilateral cotton packing which was removed. Topical Afrin/Xylocaine was applied to the nasal cavities for anesthesia and decongestant. A thorough survey of the nasal cavities did not reveal any obvious granuloma or exposed vessels. There is no further bleeding once all of the clot was suctioned out. There is a slight inflamed area of the left anteroinferior septum which bled slightly with manipulation with a suction and this was cauterized with silver nitrate. A Slimline Merocel was then placed and inflated with the Afrin/Xylocaine solution. Oral Cavity/Pharynx:  There are no mucosal lesions or masses identified. Larynx/Hypopharynx: Deferred Neuro:  No identifiable neurologic deficits. Neck: No palpable neck masses.  Studies Reviewed: none  Procedures:  Nasal cautery, packing of left nasal cavity under local anesthetic. Procedure described above.  Assessment/Plan: Recurring epistaxis, bleeding site not identified. No further bleeding during my evaluation. Packing placed on the left. Follow-up in the office in 5 days for removal of packing or sooner if he has any further bleeding. Recommend he try to stop smoking.  Ife Vitelli 02/21/2016, 6:35 PM

## 2016-02-22 NOTE — Progress Notes (Signed)
Pt's blood pressure increased- 188/98, PRN hydrlazine given. Will pass on to night nurse to recheck pressure within an hour

## 2016-02-22 NOTE — Transfer of Care (Signed)
Immediate Anesthesia Transfer of Care Note  Patient: [name]  Procedure(s) Performed: Procedure(s): EPISTAXIS CONTROL (N/A)  Patient Location: PACU  Anesthesia Type:General  Level of Consciousness: awake, alert  and oriented  Airway & Oxygen Therapy: Patient Spontanous Breathing  Post-op Assessment: Report given to RN and Post -op Vital signs reviewed and stable  Post vital signs: Reviewed and stable  Last Vitals:  Filed Vitals:   02/22/16 0115 02/22/16 0130  BP: 153/98 158/94  Pulse: 73 88  Resp:      Complications: No apparent anesthesia complications

## 2016-02-22 NOTE — Progress Notes (Signed)
After being med with prn Apresoline BP 159/86-76-20 96% ra,also med with Vicodin 2tabs H/A decreased from 10 to 4. Ice pack applied for comfort.

## 2016-02-22 NOTE — Anesthesia Procedure Notes (Signed)
Procedure Name: Intubation Date/Time: 02/22/2016 2:01 AM Performed by: Arlice ColtMANESS, [place] Wenzlick B Pre-anesthesia Checklist: Patient identified, Emergency Drugs available, Suction available, Patient being monitored and Timeout performed Patient Re-evaluated:Patient Re-evaluated prior to inductionOxygen Delivery Method: Circle system utilized Preoxygenation: Pre-oxygenation with 100% oxygen Intubation Type: IV induction, Rapid sequence and Cricoid Pressure applied Laryngoscope Size: Mac and 3 Grade View: Grade I Tube type: Oral Tube size: 8.0 mm Number of attempts: 1 Airway Equipment and Method: Stylet Placement Confirmation: ETT inserted through vocal cords under direct vision,  positive ETCO2 and breath sounds checked- equal and bilateral Secured at: 21 cm Tube secured with: Tape Dental Injury: Teeth and Oropharynx as per pre-operative assessment

## 2016-02-22 NOTE — Interval H&P Note (Signed)
History and Physical Interval Note:  02/22/2016 12:05 AM  [name]  has presented today for surgery, with the diagnosis of intractable Nose bleed  he started bleeding again after leaving the emergency Department earlier. The various methods of treatment have been discussed with the patient and family. After consideration of risks, benefits and other options for treatment, the patient has consented to  Procedure(s): EPISTAXIS CONTROL (N/A) as a surgical intervention .  The patient's history has been reviewed, patient examined, no change in status, stable for surgery.  I have reviewed the patient's chart and labs.  Questions were answered to the patient's satisfaction.     Meerab Maselli

## 2016-02-22 NOTE — Progress Notes (Signed)
Patient ID: [name], male   DOB: 1961/11/30, 55 y.o.   MRN: [id number] Subjective: Seen on rounds about 9 this morning. Had intermittent oozing through the night, associated with elevations in blood pressure.  Objective: Vital signs in last 24 hours: Temp:  [97.3 F (36.3 C)-99.2 F (37.3 C)] 97.5 F (36.4 C) (03/17 0938) Pulse Rate:  [66-121] 84 (03/17 0938) Resp:  [12-24] 20 (03/17 0938) BP: (142-199)/(72-146) 160/86 mmHg (03/17 0938) SpO2:  [93 %-100 %] 97 % (03/17 0938) Weight change:  Last BM Date:  (PTA)  Intake/Output from previous day: 03/16 0701 - 03/17 0700 In: 800 [I.V.:800] Out: 5 [Blood:5] Intake/Output this shift: Total I/O In: 600 [P.O.:600] Out: 1100 [Urine:1100]  PHYSICAL EXAM: Currently no bleeding, pack in place.  Lab Results:  Recent Labs  02/21/16 1306 02/21/16 2115  WBC 8.7 11.9*  HGB 14.5 14.0  HCT 44.7 43.3  PLT 245 270   BMET  Recent Labs  02/21/16 2115  NA 139  K 4.0  CL 103  CO2 25  GLUCOSE 93  BUN 12  CREATININE 1.00  CALCIUM 9.5    Studies/Results: No results found.  Medications: I have reviewed the patient's current medications. Assessment/Plan: Continue medical management of hypertension. Once BP under control, suspect there will be no further bleeding, then he may go home and follow up in 5 days for packing removal.     Wayland Baik 02/22/2016, 11:12 AM

## 2016-02-22 NOTE — Op Note (Signed)
OPERATIVE REPORT  DATE OF SURGERY: 02/22/2016  PATIENT:  [name],  55 y.o. male  PRE-OPERATIVE DIAGNOSIS:  Nose bleed  POST-OPERATIVE DIAGNOSIS:  Nose bleed  PROCEDURE:  Procedure(s): NaSal endoscopy with EPISTAXIS CONTROL  SURGEON:  Susy FrizzleJefry H Namiyah Grantham, MD  ASSISTANTS: None  ANESTHESIA:   General   EBL:  30 ml  DRAINS: None  LOCAL MEDICATIONS USED:  None  SPECIMEN:  none  COUNTS:  Correct  PROCEDURE DETAILS: The patient was taken to the operating room and placed on the operating table in the supine position. Following induction of general endotracheal anesthesia, the nose was prepped and draped in a standard fashion. Afrin was applied with pledgets bilaterally. The 0 nasal endoscope was used in the left nasal cavity to thoroughly inspect the mucosal surfaces. There were multiple areas with torn mucosa that were bleeding and these were cauterized with suction cautery. Small pieces of Surgicel were packed between the septum and the middle turbinate and in the superior aspect of the nasal cavity. Another piece was wrapped around a slim line Merocel which was then placed into the inferior nasal passageway and inflated with Afrin. The pharynx was suctioned of blood and secretions. There is no further bleeding. An oral gastric tube was passed to aspirate the contents of the stomach. The patient was awakened extubated and transferred to recovery in stable condition.    PATIENT DISPOSITION:  To PACU, stable

## 2016-02-23 ENCOUNTER — Observation Stay (HOSPITAL_COMMUNITY): Payer: BLUE CROSS/BLUE SHIELD

## 2016-02-23 DIAGNOSIS — F17210 Nicotine dependence, cigarettes, uncomplicated: Secondary | ICD-10-CM | POA: Diagnosis not present

## 2016-02-23 DIAGNOSIS — Z7982 Long term (current) use of aspirin: Secondary | ICD-10-CM | POA: Diagnosis not present

## 2016-02-23 DIAGNOSIS — R040 Epistaxis: Secondary | ICD-10-CM | POA: Diagnosis not present

## 2016-02-23 DIAGNOSIS — I10 Essential (primary) hypertension: Secondary | ICD-10-CM | POA: Diagnosis not present

## 2016-02-23 LAB — TROPONIN I
TROPONIN I: 0.04 ng/mL — AB (ref <0.031)
Troponin I: 0.03 ng/mL (ref <0.031)

## 2016-02-23 MED ORDER — CEPHALEXIN 500 MG PO CAPS
500.0000 mg | ORAL_CAPSULE | Freq: Four times a day (QID) | ORAL | Status: DC
  Administered 2016-02-23 – 2016-02-24 (×5): 500 mg via ORAL
  Filled 2016-02-23 (×5): qty 1

## 2016-02-23 MED ORDER — SALINE SPRAY 0.65 % NA SOLN
2.0000 | NASAL | Status: DC | PRN
  Filled 2016-02-23: qty 44

## 2016-02-23 MED ORDER — ALBUTEROL SULFATE (2.5 MG/3ML) 0.083% IN NEBU
2.5000 mg | INHALATION_SOLUTION | RESPIRATORY_TRACT | Status: DC
  Administered 2016-02-23 (×2): 2.5 mg via RESPIRATORY_TRACT
  Filled 2016-02-23 (×2): qty 3

## 2016-02-23 MED ORDER — METOPROLOL SUCCINATE ER 25 MG PO TB24
25.0000 mg | ORAL_TABLET | Freq: Every day | ORAL | Status: DC
  Administered 2016-02-23 – 2016-02-24 (×2): 25 mg via ORAL
  Filled 2016-02-23 (×2): qty 1

## 2016-02-23 NOTE — Progress Notes (Signed)
02/23/2016 11:30 AM  Rhett Bannisteross, [place] [id number]  Post-Op Day 2    Temp:  [97.5 F (36.4 C)-98.7 F (37.1 C)] 97.5 F (36.4 C) (03/18 0935) Pulse Rate:  [60-88] 72 (03/18 0935) Resp:  [18] 18 (03/18 0935) BP: (143-189)/(64-100) 143/64 mmHg (03/18 0935) SpO2:  [96 %-100 %] 99 % (03/18 0935),     Intake/Output Summary (Last 24 hours) at 02/23/16 1130 Last data filed at 02/23/16 [id number]  Gross per 24 hour  Intake   1920 ml  Output   2725 ml  Net   -805 ml    No results found for this or any previous visit (from the past 24 hour(s)).  SUBJECTIVE:  C/o headache.  New tightness in chest, like he "has a cold".  Notes minor bleeding NS when the packing shifts with coughing or sneezing  OBJECTIVE:  No blood in pharynx.  Packing clean, no bleeding noted.  IMPRESSION:  Hypertensive epistaxis, aggravated by chronic aspirin therapy  PLAN:  Nasal hygiene measures.  Cephalexin for indwelling nasal foreign body.  Appreciate continued help from Internal Medicine for HTN, and would ask that they evaluate for chest discomfort.  [name], Rufus Beske

## 2016-02-23 NOTE — Progress Notes (Addendum)
Agree with initial consult dated 02/23/16 In addition, based on further data after chatting c patient   Recovering from recent L Lateral malleolar fracture, was at his first day back to work in 2 months-on 3/14 "the coldest day of the year on 43 yr ppd h/o smoking-takes otc meds, infrequent inhaler use   Cp pleuritic-EKG inversion V5, V6 chronic per patient [hospital[place] in the past in [place]  No prior h/o ACS "my heart is like a horse"-has had studies previously at above states per patient  BP much better controlled with addition this am of Metoprolol XL 25 in addition to HCTZ 25 /Lisinopril 40 qd-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  Continue to trend.  Will see in am.  Pleas KochJai Shailynn Fong, MD [facility] [phone]

## 2016-02-24 DIAGNOSIS — R040 Epistaxis: Secondary | ICD-10-CM | POA: Diagnosis not present

## 2016-02-24 LAB — CBC WITH DIFFERENTIAL/PLATELET
BASOS ABS: 0 10*3/uL (ref 0.0–0.1)
BASOS PCT: 0 %
EOS PCT: 1 %
Eosinophils Absolute: 0.2 10*3/uL (ref 0.0–0.7)
HEMATOCRIT: 36.9 % — AB (ref 39.0–52.0)
Hemoglobin: 12 g/dL — ABNORMAL LOW (ref 13.0–17.0)
Lymphocytes Relative: 18 %
Lymphs Abs: 2.2 10*3/uL (ref 0.7–4.0)
MCH: 28.3 pg (ref 26.0–34.0)
MCHC: 32.5 g/dL (ref 30.0–36.0)
MCV: 87 fL (ref 78.0–100.0)
MONO ABS: 1.3 10*3/uL — AB (ref 0.1–1.0)
MONOS PCT: 11 %
NEUTROS ABS: 8.7 10*3/uL — AB (ref 1.7–7.7)
Neutrophils Relative %: 70 %
PLATELETS: 213 10*3/uL (ref 150–400)
RBC: 4.24 MIL/uL (ref 4.22–5.81)
RDW: 14.1 % (ref 11.5–15.5)
WBC: 12.4 10*3/uL — ABNORMAL HIGH (ref 4.0–10.5)

## 2016-02-24 LAB — COMPREHENSIVE METABOLIC PANEL
ALBUMIN: 3.6 g/dL (ref 3.5–5.0)
ALK PHOS: 54 U/L (ref 38–126)
ALT: 14 U/L — AB (ref 17–63)
ANION GAP: 10 (ref 5–15)
AST: 17 U/L (ref 15–41)
BILIRUBIN TOTAL: 0.6 mg/dL (ref 0.3–1.2)
BUN: 10 mg/dL (ref 6–20)
CALCIUM: 9.2 mg/dL (ref 8.9–10.3)
CO2: 24 mmol/L (ref 22–32)
CREATININE: 0.98 mg/dL (ref 0.61–1.24)
Chloride: 104 mmol/L (ref 101–111)
GFR calc Af Amer: >60 mL/min (ref >60)
GFR calc non Af Amer: >60 mL/min (ref >60)
GLUCOSE: 124 mg/dL — AB (ref 65–99)
Potassium: 3.7 mmol/L (ref 3.5–5.1)
SODIUM: 138 mmol/L (ref 135–145)
TOTAL PROTEIN: 6.1 g/dL — AB (ref 6.5–8.1)

## 2016-02-24 LAB — TROPONIN I: Troponin I: 0.03 ng/mL (ref <0.031)

## 2016-02-24 MED ORDER — LISINOPRIL 40 MG PO TABS: 40.0000 mg | ORAL_TABLET | Freq: Every day | ORAL | Status: DC

## 2016-02-24 MED ORDER — CEPHALEXIN 500 MG PO CAPS
500.0000 mg | ORAL_CAPSULE | Freq: Four times a day (QID) | ORAL | Status: DC
Start: 2016-02-24 — End: 2016-11-18

## 2016-02-24 MED ORDER — METOPROLOL SUCCINATE ER 25 MG PO TB24: 25.0000 mg | ORAL_TABLET | Freq: Every day | ORAL | Status: DC

## 2016-02-24 MED ORDER — HYDROCODONE-ACETAMINOPHEN 5-325 MG PO TABS: 1.0000 | ORAL_TABLET | ORAL | Status: DC | PRN

## 2016-02-24 MED ORDER — OXYCODONE-ACETAMINOPHEN 5-325 MG PO TABS
2.0000 | ORAL_TABLET | Freq: Once | ORAL | Status: AC
  Administered 2016-02-24: 2 via ORAL
  Filled 2016-02-24: qty 2

## 2016-02-24 MED ORDER — OXYCODONE-ACETAMINOPHEN 5-325 MG PO TABS: 1.0000 | ORAL_TABLET | ORAL | Status: DC | PRN

## 2016-02-24 NOTE — Progress Notes (Signed)
IV removed. One time dose of Percocet given per Mahala MenghiniSamtani, MD. AVS given to patient.  Understanding verbalized.  Belongings packed, paper scrubs given to patient to travel home in, all clothes were taken home by wife.  Wife is picking him up via taxi.

## 2016-02-24 NOTE — Progress Notes (Addendum)
[name] AVW:[id number] DOB: 11-20-1961 DOA: 02/21/2016 PCP: No PCP Per Patient  Assessment/Plan:  1. Epistaxis-as per ENT, appointment with Dr. Pollyann Kennedy on 3/21 for removal of tamponade, Keflex duration etc. 2. Pleuritic chest pain noncardiogenic-EKG and troponins are negative, chest x-ray was negative on 3/18 3. Current smoker offered patch patient will quit on his own 4. Better controlled hypertension-in addition to lisinopril 40 and HCTZ 25 patient was started on metoprolol XL 25 daily. Patient should not blow his nose and should have adequate pain control and I discussed this plan of care with Dr. 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  Hospitalists consulted for hypertension control to prevent further epistaxis  Past medical history-As per Problem list Chart reviewed as below- Reviewed  Procedures:  Nasal endoscopy with epistaxis control performed on 02/22/16  Antibiotics:  Keflex as per ENT   Subjective   Has moderate to severe left headache and feels stuffed up in the left orbit and nose and is blowing his nose actively Has just been seen by ENT    Objective     Objective: Filed Vitals:   02/23/16 2307 02/24/16 0100 02/24/16 0655 02/24/16 1023  BP: 153/79 144/85 189/62 133/69  Pulse: 103 100 64 67  Temp:  98.1 F (36.7 C) 97.3 F (36.3 C) 98.1 F (36.7 C)  TempSrc:  Oral Oral Oral  Resp:  SpO2: 97% 100% 98% 98%    Intake/Output Summary (Last 24 hours) at 02/24/16 1615 Last data filed at 02/24/16 1306  Gross per 24 hour  Intake 3938.75 ml  Output   3025 ml  Net 913.75 ml    Exam:  General: EOMI , crusting 2 nares , some blood on  telemetry  Cardiovascular: S1-S2 no murmur rub or gallop  Respiratory: Clinically clear no added sound  Abdomen: Soft nontender nondistended  Skinno lower extremity edema  Neurointact  Data Reviewed: Basic Metabolic Panel:  Recent Labs Lab 02/21/16 2115 02/24/16 0221  NA 139 138  K 4.0 3.7  CL 103 104  CO2 25 24  GLUCOSE 93 124*  BUN 12 10  CREATININE 1.00 0.98  CALCIUM 9.5 9.2   Liver Function Tests:  Recent Labs Lab 02/21/16 2115 02/24/16 0221  AST 22 17  ALT 21 14*  ALKPHOS 58 54  BILITOT 0.8 0.6  PROT 6.9 6.1*  ALBUMIN 4.4 3.6   No results for input(s): LIPASE, AMYLASE in the last 168 hours. No results for input(s): AMMONIA in the last 168 hours. CBC:  Recent Labs Lab 02/21/16 1306 02/21/16 2115 02/24/16 0221  WBC 8.7 11.9* 12.4*  NEUTROABS  --  7.8* 8.7*  HGB 14.5 14.0 12.0*  HCT 44.7 43.3 36.9*  MCV 88.5 87.1 87.0  PLT 245 270 213   Cardiac Enzymes:  Recent Labs Lab 02/23/16 1430 02/23/16 2010 02/24/16 0221  TROPONINI 0.04* 0.03 0.03   BNP: Invalid input(s): POCBNP CBG: No results for input(s): GLUCAP in the last 168 hours.  No results found for this or any previous visit (from the past 240 hour(s)).   Studies:              All Imaging reviewed and is as per above notation   Scheduled Meds: .  cephALEXin  500 mg Oral 4 times per day  . hydrochlorothiazide  25 mg Oral Daily  . lisinopril  40 mg Oral Daily  . metoprolol succinate  25 mg Oral Daily   Continuous Infusions:      Pleas KochJai Latwan Luchsinger, MD  [facility] Pager [phone] 02/24/2016, 4:15 PM

## 2016-02-24 NOTE — Progress Notes (Signed)
02/24/2016 2:17 PM  [name] [id number]  Post-Op Day 2    Temp:  [97.3 F (36.3 C)-99 F (37.2 C)] 98.1 F (36.7 C) (03/19 1023) Pulse Rate:  [64-103] 67 (03/19 1023) Resp:  [18-20] 18 (03/19 1023) BP: (133-189)/(62-94) 133/69 mmHg (03/19 1023) SpO2:  [97 %-100 %] 98 % (03/19 1023),     Intake/Output Summary (Last 24 hours) at 02/24/16 1417 Last data filed at 02/24/16 1306  Gross per 24 hour  Intake 3938.75 ml  Output   3025 ml  Net 913.75 ml    Results for orders placed or performed during the hospital encounter of 02/21/16 (from the past 24 hour(s))  Troponin I (q 6hr x 3)     Status: Abnormal   Collection Time: 02/23/16  2:30 PM  Result Value Ref Range   Troponin I 0.04 (H) <0.031 ng/mL  Troponin I (q 6hr x 3)     Status: None   Collection Time: 02/23/16  8:10 PM  Result Value Ref Range   Troponin I 0.03 <0.031 ng/mL  Troponin I (q 6hr x 3)     Status: None   Collection Time: 02/24/16  2:21 AM  Result Value Ref Range   Troponin I 0.03 <0.031 ng/mL  Comprehensive metabolic panel     Status: Abnormal   Collection Time: 02/24/16  2:21 AM  Result Value Ref Range   Sodium 138 135 - 145 mmol/L   Potassium 3.7 3.5 - 5.1 mmol/L   Chloride 104 101 - 111 mmol/L   CO2 24 22 - 32 mmol/L   Glucose, Bld 124 (H) 65 - 99 mg/dL   BUN 10 6 - 20 mg/dL   Creatinine, Ser 4.09 0.61 - 1.24 mg/dL   Calcium 9.2 8.9 - 81.1 mg/dL   Total Protein 6.1 (L) 6.5 - 8.1 g/dL   Albumin 3.6 3.5 - 5.0 g/dL   AST 17 15 - 41 U/L   ALT 14 (L) 17 - 63 U/L   Alkaline Phosphatase 54 38 - 126 U/L   Total Bilirubin 0.6 0.3 - 1.2 mg/dL   GFR calc non Af Amer >60 >60 mL/min   GFR calc Af Amer >60 >60 mL/min   Anion gap 10 5 - 15  CBC with Differential/Platelet     Status: Abnormal   Collection Time: 02/24/16  2:21 AM  Result Value Ref Range   WBC 12.4 (H) 4.0 - 10.5 K/uL   RBC 4.24 4.22 - 5.81 MIL/uL   Hemoglobin 12.0 (L) 13.0 - 17.0 g/dL   HCT 91.4 (L) 78.2 - 95.6 %   MCV 87.0 78.0 - 100.0  fL   MCH 28.3 26.0 - 34.0 pg   MCHC 32.5 30.0 - 36.0 g/dL   RDW 21.3 08.6 - 57.8 %   Platelets 213 150 - 400 K/uL   Neutrophils Relative % 70 %   Neutro Abs 8.7 (H) 1.7 - 7.7 K/uL   Lymphocytes Relative 18 %   Lymphs Abs 2.2 0.7 - 4.0 K/uL   Monocytes Relative 11 %   Monocytes Absolute 1.3 (H) 0.1 - 1.0 K/uL   Eosinophils Relative 1 %   Eosinophils Absolute 0.2 0.0 - 0.7 K/uL   Basophils Relative 0 %   Basophils Absolute 0.0 0.0 - 0.1 K/uL    SUBJECTIVE:  No more chest pain.  Severe LEFT head pain related to packing.  Still some bloody oozing.  Denies hx or symptoms of reflux.    OBJECTIVE:  occ drop of blood.  EKG shows new T wave changes. CXR clear.  Troponins low.    IMPRESSION:  Typical pain from packing. Still fluctuating HTN.  Mild bloody oozing.    PLAN:  Appreciate assistance from IM.  Home on BP meds, abx, analgesics.  No ASA.  Follow up 2 days Dr. Pollyann Kennedyosen in office.  IM will help arrange for a PCP soon.    [name], [name]

## 2016-02-24 NOTE — Discharge Instructions (Signed)
Keep head elevated. No strenuous activity Recheck Dr. Pollyann Kennedyosen 2 days for packing removal No aspirin for 1 week Prescriptions for hydrochlorothiazide, Metoprolol,  and lisinopril for blood pressure.  Prescriptions for Oxycodone for pain.  Prescription for Cephalexin antibiotic to prevent infection in your nose. You could add 400 mg Ibuprofen every 4-6 hrs as needed for additional pain control. Spray your nose frequently with nasal saline spray.  Dr. Pollyann Kennedyosen will give you nasal hygiene measures when you come back into the office.   Call for significant bleeding.  [phone]

## 2016-02-24 NOTE — Discharge Summary (Signed)
02/24/2016 2:34 PM  Rhett Bannisteross, Sigmond [id number]  See today's progress note  Admit:  16 MAR Discharge: 19 MAR Final Diagnosis: uncontrolled hypertension.  Epistaxis, LEFT Proc:  Operative control epistaxis, 17 MAR Comp: none Cond:  Still hypertensive, but better controlled.  Sl residual bleeding.  Pain from nasal packing. Recheck: 2 days for pack removal. 1 week with Internal Medicine for assistance with HTN, other active medical problems Rx's:  Hydrochlorothiazide (pt has at home), Lisinopril, Metoprolol XL 25 mg, Cephalexin, Oxycodone Instructions written and given  Hosp Course:  Admitted 16 MAR.  Bleeding site not identified.  Taken to OR for cautery and packing.  Bleeding site not still identified.  Uncontrolled HTN with medication modifications per IM.  On POD 2, BP better controlled.  Sl residual oozing, seems to be somewhat coordinated with elevated BP.  Discharged to home and care of family.      [name], Tarrin Lebow

## 2016-02-25 ENCOUNTER — Encounter (HOSPITAL_COMMUNITY): Payer: Self-pay | Admitting: Otolaryngology

## 2016-03-03 ENCOUNTER — Encounter (HOSPITAL_COMMUNITY): Payer: Self-pay | Admitting: Otolaryngology

## 2016-03-03 NOTE — Addendum Note (Signed)
Addendum  created 03/03/16 1148 by Judie Petitharlene Edwards, MD   Modules edited: Clinical Notes   Clinical Notes:  Pend: [id number]

## 2016-03-03 NOTE — Anesthesia Preprocedure Evaluation (Signed)
Anesthesia Evaluation  Patient identified by MRN, date of birth, ID band Patient awake  General Assessment Comment:Emergency surgery.  Reviewed: Allergy & Precautions, NPO status , Patient's Chart, lab work & pertinent test results  Airway Mallampati: II  TM Distance: >3 FB Neck ROM: Full    Dental   Pulmonary Current Smoker,    breath sounds clear to auscultation       Cardiovascular hypertension,  Rhythm:Regular Rate:Normal     Neuro/Psych    GI/Hepatic negative GI ROS, Neg liver ROS,   Endo/Other  negative endocrine ROS  Renal/GU negative Renal ROS     Musculoskeletal   Abdominal   Peds  Hematology   Anesthesia Other Findings   Reproductive/Obstetrics                             Anesthesia Physical Anesthesia Plan  ASA: III  Anesthesia Plan: General   Post-op Pain Management:    Induction: Intravenous and Rapid sequence  Airway Management Planned: Oral ETT  Additional Equipment:   Intra-op Plan:   Post-operative Plan: Extubation in OR  Informed Consent: I have reviewed the patients History and Physical, chart, labs and discussed the procedure including the risks, benefits and alternatives for the proposed anesthesia with the patient or authorized representative who has indicated his/her understanding and acceptance.   Dental advisory given  Plan Discussed with: CRNA, Anesthesiologist and Surgeon  Anesthesia Plan Comments:         Anesthesia Quick Evaluation

## 2016-03-03 NOTE — Anesthesia Postprocedure Evaluation (Signed)
Anesthesia Post Note  Patient: Juan BannisterMartin Jordan  Procedure(s) Performed: Procedure(s) (LRB): EPISTAXIS CONTROL (N/A)  Patient location during evaluation: PACU Anesthesia Type: General Level of consciousness: awake Pain management: pain level controlled Vital Signs Assessment: post-procedure vital signs reviewed and stable Respiratory status: spontaneous breathing Cardiovascular status: stable Anesthetic complications: no    Last Vitals:  Filed Vitals:   02/24/16 0655 02/24/16 1023  BP: 189/62 133/69  Pulse: 64 67  Temp: 36.3 C 36.7 C  Resp: 20 18    Last Pain:  Filed Vitals:   02/24/16 1527  PainSc: 7                  EDWARDS,Joram Venson

## 2016-03-03 NOTE — Anesthesia Postprocedure Evaluation (Signed)
Anesthesia Post Note  Patient: Juan BannisterMartin Jordan  Procedure(s) Performed: Procedure(s) (LRB): EPISTAXIS CONTROL (N/A)  Anesthesia Post Evaluation  Last Vitals:  Filed Vitals:   02/24/16 0655 02/24/16 1023  BP: 189/62 133/69  Pulse: 64 67  Temp: 36.3 C 36.7 C  Resp: 20 18    Last Pain:  Filed Vitals:   02/24/16 1527  PainSc: 7                  EDWARDS,Corinthian Mizrahi

## 2016-04-09 DIAGNOSIS — R002 Palpitations: Secondary | ICD-10-CM | POA: Diagnosis not present

## 2016-04-09 DIAGNOSIS — I10 Essential (primary) hypertension: Secondary | ICD-10-CM | POA: Diagnosis not present

## 2016-04-09 DIAGNOSIS — K529 Noninfective gastroenteritis and colitis, unspecified: Secondary | ICD-10-CM | POA: Diagnosis not present

## 2016-09-12 ENCOUNTER — Emergency Department (HOSPITAL_COMMUNITY)
Admission: EM | Admit: 2016-09-12 | Discharge: 2016-09-12 | Disposition: A | Discharge status: Home / Self Care | Payer: BLUE CROSS/BLUE SHIELD | Attending: Emergency Medicine | Admitting: Emergency Medicine

## 2016-09-12 ENCOUNTER — Encounter (HOSPITAL_COMMUNITY): Payer: Self-pay | Admitting: Emergency Medicine

## 2016-09-12 ENCOUNTER — Emergency Department (HOSPITAL_COMMUNITY): Payer: BLUE CROSS/BLUE SHIELD

## 2016-09-12 DIAGNOSIS — R05 Cough: Secondary | ICD-10-CM

## 2016-09-12 DIAGNOSIS — I10 Essential (primary) hypertension: Secondary | ICD-10-CM | POA: Diagnosis not present

## 2016-09-12 DIAGNOSIS — R059 Cough, unspecified: Secondary | ICD-10-CM

## 2016-09-12 DIAGNOSIS — J40 Bronchitis, not specified as acute or chronic: Secondary | ICD-10-CM | POA: Insufficient documentation

## 2016-09-12 DIAGNOSIS — F17210 Nicotine dependence, cigarettes, uncomplicated: Secondary | ICD-10-CM | POA: Diagnosis not present

## 2016-09-12 DIAGNOSIS — Z79899 Other long term (current) drug therapy: Secondary | ICD-10-CM | POA: Diagnosis not present

## 2016-09-12 LAB — I-STAT CHEM 8, ED
BUN: 13 mg/dL (ref 6–20)
CREATININE: 0.9 mg/dL (ref 0.61–1.24)
Calcium, Ion: 1.15 mmol/L (ref 1.15–1.40)
Chloride: 99 mmol/L — ABNORMAL LOW (ref 101–111)
Glucose, Bld: 115 mg/dL — ABNORMAL HIGH (ref 65–99)
HEMATOCRIT: 51 % (ref 39.0–52.0)
HEMOGLOBIN: 17.3 g/dL — AB (ref 13.0–17.0)
POTASSIUM: 4 mmol/L (ref 3.5–5.1)
Sodium: 138 mmol/L (ref 135–145)
TCO2: 25 mmol/L (ref 0–100)

## 2016-09-12 MED ORDER — AZITHROMYCIN 250 MG PO TABS: 250.0000 mg | ORAL_TABLET | Freq: Every day | ORAL | 0 refills | Status: DC

## 2016-09-12 MED ORDER — ALBUTEROL SULFATE HFA 108 (90 BASE) MCG/ACT IN AERS
2.0000 | INHALATION_SPRAY | RESPIRATORY_TRACT | Status: DC | PRN
  Administered 2016-09-12: 2 via RESPIRATORY_TRACT
  Filled 2016-09-12: qty 6.7

## 2016-09-12 NOTE — ED Notes (Signed)
Fayrene FearingJames MD aware of pt status and complaint. Verbal orders placed.

## 2016-09-12 NOTE — ED Provider Notes (Signed)
[facility]-EMERGENCY DEPT Provider Note   CSN: [id number] Arrival date & time: 09/12/16  1303     History   Chief Complaint Chief Complaint  Patient presents with  . Cough  . Hypertension    HPI [name] is a 55 y.o. male.  Patient presents to the ED with a chief complaint of cough.  Patient states that he has been having a cough for the past week.  States that he has been passing the cough back and forth with his wife.  He denies any fevers or chills, but states that he has been having a productive cough.  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  He states that he was seen by his PCP today at work and was told that he needed to come to the ER for high blood pressure.  Patient states that he always has high blood pressure prior to taking his BP at lunch time.  He denies any vision changes, headache, dark urine, numbness, weakness, or tingling.  States that the entire reason that he went to see his PCP today was to get and antibiotic and a return to work note.   The history is provided by the patient. No language interpreter was used.    Past Medical History:  Diagnosis Date  . Hypertension     Patient Active Problem List   Diagnosis Date Noted  . Epistaxis 02/22/2016  . Uncontrolled hypertension 02/22/2016    Past Surgical History:  Procedure Laterality Date  . ADRENAL GLAND SURGERY    . NASAL HEMORRHAGE CONTROL N/A 02/22/2016   Procedure: EPISTAXIS CONTROL;  Surgeon: Serena Colonel, MD;  Location: [facility] OR;  Service: ENT;  Laterality: N/A;       Home Medications    Prior to Admission medications   Medication Sig Start Date End Date Taking? Authorizing Provider  hydrochlorothiazide (HYDRODIURIL) 25 MG tablet Take 1 tablet (25 mg total) by mouth daily. 02/21/16  Yes Benjiman Core, MD  lisinopril (PRINIVIL,ZESTRIL) 40 MG tablet Take 1 tablet (40 mg total) by mouth daily. 02/24/16  Yes Flo Shanks, MD    metoprolol succinate (TOPROL-XL) 25 MG 24 hr tablet Take 1 tablet (25 mg total) by mouth daily. 02/24/16  Yes Flo Shanks, MD  azithromycin (ZITHROMAX) 250 MG tablet Take 1 tablet (250 mg total) by mouth daily. Take first 2 tablets together, then 1 every day until finished. 09/12/16   Roxy Horseman, PA-C  cephALEXin (KEFLEX) 500 MG capsule Take 1 capsule (500 mg total) by mouth every 6 (six) hours. Patient not taking: Reported on 09/12/2016 02/24/16   Flo Shanks, MD  oxyCODONE-acetaminophen (ROXICET) 5-325 MG tablet Take 1-2 tablets by mouth every 4 (four) hours as needed for severe pain. Patient not taking: Reported on 09/12/2016 02/24/16   Flo Shanks, MD    Family History No family history on file.  Social History Social History  Substance Use Topics  . Smoking status: Current Every Day Smoker    Packs/day: 1.00    Types: Cigarettes  . Smokeless tobacco: Not on file  . Alcohol use Yes     Allergies   Review of patient's allergies indicates no known allergies.   Review of Systems Review of Systems  Constitutional: Negative for chills and fever.  HENT: Positive for postnasal drip, rhinorrhea, sinus pressure, sneezing and sore throat.   Respiratory: Positive for cough. Negative for shortness of breath.   Cardiovascular: Negative for chest  pain.  Gastrointestinal: Negative for abdominal pain, constipation, diarrhea, nausea and vomiting.  Genitourinary: Negative for dysuria.     Physical Exam Updated Vital Signs BP (!) 158/121   Pulse 96   Temp 97.7 F (36.5 C) (Oral)   Resp 22   Ht 6\' 2"  (1.88 m)   Wt 112.5 kg   SpO2 98%   BMI 31.84 kg/m   Physical Exam  Constitutional: He appears well-developed and well-nourished. No distress.  HENT:  Head: Normocephalic.  Right Ear: External ear normal.  Left Ear: External ear normal.  Mildly erythematous, no tonsillar exudate, no abscess, no stridor, uvula is midline   Eyes: Conjunctivae and EOM are normal. Pupils are  equal, round, and reactive to light.  Neck: Normal range of motion. Neck supple.  Cardiovascular: Normal rate, regular rhythm and normal heart sounds.  Exam reveals no gallop and no friction rub.   No murmur heard. Pulmonary/Chest: Effort normal and breath sounds normal. No stridor. No respiratory distress. He has no wheezes. He has no rales. He exhibits no tenderness.  CTAB  Abdominal: Soft. Bowel sounds are normal. He exhibits no distension. There is no tenderness.  Musculoskeletal: Normal range of motion. He exhibits no tenderness.  Neurological: He is alert.  Skin: Skin is warm and dry. No rash noted. He is not diaphoretic.  Psychiatric: He has a normal mood and affect. His behavior is normal. Judgment and thought content normal.  Nursing note and vitals reviewed.    ED Treatments / Results  Labs (all labs ordered are listed, but only abnormal results are displayed) Labs Reviewed  I-STAT CHEM 8, ED - Abnormal; Notable for the following:       Result Value   Chloride 99 (*)    Glucose, Bld 115 (*)    Hemoglobin 17.3 (*)    All other components within normal limits    EKG  EKG Interpretation  Date/Time:  Friday September 12 2016 13:28:42 EDT Ventricular Rate:  100 PR Interval:    QRS Duration: 102 QT Interval:  349 QTC Calculation: 451 R Axis:   -32 Text Interpretation:  Sinus tachycardia Left axis deviation Borderline low voltage, extremity leads Nonspecific T abnormalities, lateral leads Confirmed by Fayrene Fearing  MD, MARK ([id number]) on 09/12/2016 4:07:49 PM       Radiology Dg Chest 2 View  Result Date: 09/12/2016 CLINICAL DATA:  Cough and congestion for 1 week. EXAM: CHEST  2 VIEW COMPARISON:  None. FINDINGS: Cardiomediastinal silhouette is normal. Mediastinal contours appear intact. Calcific atherosclerotic disease of the aorta noted. There is no evidence of focal airspace consolidation, pleural effusion or pneumothorax. Bilateral central peribronchial opacities are noted.  Osseous structures are without acute abnormality. Soft tissues are grossly normal. IMPRESSION: Bilateral peribronchial opacities with central predominance, usually seen with acute bronchitis or reactive airway disease. Electronically Signed   By: Ted Mcalpine M.D.   On: 09/12/2016 13:41    Procedures Procedures (including critical care time)  Medications Ordered in ED Medications  albuterol (PROVENTIL HFA;VENTOLIN HFA) 108 (90 Base) MCG/ACT inhaler 2 puff (not administered)     Initial Impression / Assessment and Plan / ED Course  I have reviewed the triage vital signs and the nursing notes.  Pertinent labs & imaging results that were available during my care of the patient were reviewed by me and considered in my medical decision making (see chart for details).  Clinical Course   URI x 1 week with hx of emphysema and smoking, will give a z  pak and inhaler.  BP improving now that patient has taken his BP meds.  Recommend close follow-up with PCP.  Final Clinical Impressions(s) / ED Diagnoses   Final diagnoses:  Bronchitis  Cough  Hypertension, unspecified type    New Prescriptions New Prescriptions   AZITHROMYCIN (ZITHROMAX) 250 MG TABLET    Take 1 tablet (250 mg total) by mouth daily. Take first 2 tablets together, then 1 every day until finished.     Roxy Horsemanobert Atari Novick, PA-C 09/12/16 1623    Laurence Spatesachel Morgan Little, MD 09/17/16 [phone]

## 2016-09-12 NOTE — ED Notes (Signed)
Pt not found in room to do I-stat.  Treatment delayed.  Unknown location

## 2016-09-12 NOTE — ED Triage Notes (Signed)
Pt complaint of cough for a week. Sent today from work health clinic for pneumonia work up and hypertension.

## 2016-09-12 NOTE — ED Notes (Signed)
Pt states he was smoking outside and not in room.

## 2016-11-18 ENCOUNTER — Emergency Department (HOSPITAL_COMMUNITY): Payer: BLUE CROSS/BLUE SHIELD

## 2016-11-18 ENCOUNTER — Encounter (HOSPITAL_COMMUNITY): Payer: Self-pay | Admitting: Emergency Medicine

## 2016-11-18 ENCOUNTER — Observation Stay (HOSPITAL_COMMUNITY)
Admission: EM | Admit: 2016-11-18 | Discharge: 2016-11-20 | Disposition: A | Discharge status: Home / Self Care | Payer: BLUE CROSS/BLUE SHIELD | Attending: Family Medicine | Admitting: Family Medicine

## 2016-11-18 DIAGNOSIS — E669 Obesity, unspecified: Secondary | ICD-10-CM | POA: Diagnosis not present

## 2016-11-18 DIAGNOSIS — F17210 Nicotine dependence, cigarettes, uncomplicated: Secondary | ICD-10-CM | POA: Insufficient documentation

## 2016-11-18 DIAGNOSIS — I160 Hypertensive urgency: Secondary | ICD-10-CM | POA: Diagnosis not present

## 2016-11-18 DIAGNOSIS — Y9289 Other specified places as the place of occurrence of the external cause: Secondary | ICD-10-CM | POA: Insufficient documentation

## 2016-11-18 DIAGNOSIS — M4186 Other forms of scoliosis, lumbar region: Secondary | ICD-10-CM | POA: Insufficient documentation

## 2016-11-18 DIAGNOSIS — Z6832 Body mass index (BMI) 32.0-32.9, adult: Secondary | ICD-10-CM | POA: Diagnosis not present

## 2016-11-18 DIAGNOSIS — M545 Low back pain, unspecified: Secondary | ICD-10-CM

## 2016-11-18 DIAGNOSIS — I10 Essential (primary) hypertension: Secondary | ICD-10-CM | POA: Insufficient documentation

## 2016-11-18 DIAGNOSIS — M5136 Other intervertebral disc degeneration, lumbar region: Secondary | ICD-10-CM | POA: Diagnosis not present

## 2016-11-18 DIAGNOSIS — R0602 Shortness of breath: Secondary | ICD-10-CM | POA: Insufficient documentation

## 2016-11-18 DIAGNOSIS — D72829 Elevated white blood cell count, unspecified: Secondary | ICD-10-CM | POA: Diagnosis not present

## 2016-11-18 DIAGNOSIS — Y9389 Activity, other specified: Secondary | ICD-10-CM | POA: Insufficient documentation

## 2016-11-18 DIAGNOSIS — I4581 Long QT syndrome: Secondary | ICD-10-CM | POA: Insufficient documentation

## 2016-11-18 DIAGNOSIS — S3992XA Unspecified injury of lower back, initial encounter: Secondary | ICD-10-CM | POA: Diagnosis not present

## 2016-11-18 DIAGNOSIS — W000XXA Fall on same level due to ice and snow, initial encounter: Secondary | ICD-10-CM | POA: Diagnosis not present

## 2016-11-18 DIAGNOSIS — R079 Chest pain, unspecified: Secondary | ICD-10-CM

## 2016-11-18 DIAGNOSIS — I088 Other rheumatic multiple valve diseases: Secondary | ICD-10-CM | POA: Insufficient documentation

## 2016-11-18 DIAGNOSIS — E279 Disorder of adrenal gland, unspecified: Secondary | ICD-10-CM | POA: Diagnosis not present

## 2016-11-18 DIAGNOSIS — R748 Abnormal levels of other serum enzymes: Secondary | ICD-10-CM | POA: Diagnosis not present

## 2016-11-18 DIAGNOSIS — M5442 Lumbago with sciatica, left side: Secondary | ICD-10-CM | POA: Insufficient documentation

## 2016-11-18 DIAGNOSIS — R000 Tachycardia, unspecified: Secondary | ICD-10-CM | POA: Diagnosis not present

## 2016-11-18 DIAGNOSIS — R0789 Other chest pain: Secondary | ICD-10-CM | POA: Diagnosis not present

## 2016-11-18 DIAGNOSIS — R7989 Other specified abnormal findings of blood chemistry: Secondary | ICD-10-CM

## 2016-11-18 DIAGNOSIS — S3993XA Unspecified injury of pelvis, initial encounter: Secondary | ICD-10-CM | POA: Diagnosis not present

## 2016-11-18 DIAGNOSIS — Y998 Other external cause status: Secondary | ICD-10-CM | POA: Diagnosis not present

## 2016-11-18 DIAGNOSIS — R778 Other specified abnormalities of plasma proteins: Secondary | ICD-10-CM

## 2016-11-18 HISTORY — DX: Personal history of other diseases of the digestive system: Z87.19

## 2016-11-18 LAB — CBC WITH DIFFERENTIAL/PLATELET
BASOS PCT: 0 %
Basophils Absolute: 0.1 10*3/uL (ref 0.0–0.1)
EOS ABS: 0 10*3/uL (ref 0.0–0.7)
Eosinophils Relative: 0 %
HCT: 50.4 % (ref 39.0–52.0)
Hemoglobin: 17.8 g/dL — ABNORMAL HIGH (ref 13.0–17.0)
Lymphocytes Relative: 5 %
Lymphs Abs: 1.1 10*3/uL (ref 0.7–4.0)
MCH: 30.6 pg (ref 26.0–34.0)
MCHC: 35.3 g/dL (ref 30.0–36.0)
MCV: 86.7 fL (ref 78.0–100.0)
MONO ABS: 1.5 10*3/uL — AB (ref 0.1–1.0)
MONOS PCT: 7 %
NEUTROS PCT: 88 %
Neutro Abs: 19 10*3/uL — ABNORMAL HIGH (ref 1.7–7.7)
PLATELETS: 274 10*3/uL (ref 150–400)
RBC: 5.81 MIL/uL (ref 4.22–5.81)
RDW: 14.4 % (ref 11.5–15.5)
WBC: 21.6 10*3/uL — ABNORMAL HIGH (ref 4.0–10.5)

## 2016-11-18 LAB — URINALYSIS, ROUTINE W REFLEX MICROSCOPIC
Bilirubin Urine: NEGATIVE
GLUCOSE, UA: NEGATIVE mg/dL
Ketones, ur: 5 mg/dL — AB
Leukocytes, UA: NEGATIVE
Nitrite: NEGATIVE
PH: 6 (ref 5.0–8.0)
PROTEIN: 30 mg/dL — AB
Specific Gravity, Urine: 1.01 (ref 1.005–1.030)
Squamous Epithelial / LPF: NONE SEEN

## 2016-11-18 LAB — BASIC METABOLIC PANEL
Anion gap: 15 (ref 5–15)
BUN: 15 mg/dL (ref 6–20)
CALCIUM: 9.6 mg/dL (ref 8.9–10.3)
CO2: 24 mmol/L (ref 22–32)
CREATININE: 1.03 mg/dL (ref 0.61–1.24)
Chloride: 98 mmol/L — ABNORMAL LOW (ref 101–111)
GFR calc non Af Amer: >60 mL/min (ref >60)
GLUCOSE: 138 mg/dL — AB (ref 65–99)
Potassium: 4.1 mmol/L (ref 3.5–5.1)
Sodium: 137 mmol/L (ref 135–145)

## 2016-11-18 LAB — PROCALCITONIN

## 2016-11-18 LAB — TROPONIN I
TROPONIN I: 0.05 ng/mL — AB (ref <0.03)
TROPONIN I: 0.03 ng/mL — AB (ref <0.03)
TROPONIN I: 0.03 ng/mL — AB (ref <0.03)
Troponin I: 0.03 ng/mL (ref <0.03)

## 2016-11-18 LAB — SEDIMENTATION RATE: SED RATE: 5 mm/h (ref 0–16)

## 2016-11-18 LAB — C-REACTIVE PROTEIN

## 2016-11-18 MED ORDER — HEPARIN SODIUM (PORCINE) 5000 UNIT/ML IJ SOLN
5000.0000 [IU] | Freq: Three times a day (TID) | INTRAMUSCULAR | Status: DC
  Administered 2016-11-18 – 2016-11-19 (×4): 5000 [IU] via SUBCUTANEOUS
  Filled 2016-11-18 (×5): qty 1

## 2016-11-18 MED ORDER — ACETAMINOPHEN 325 MG PO TABS: 650.0000 mg | ORAL_TABLET | ORAL | Status: DC | PRN

## 2016-11-18 MED ORDER — HYDROCHLOROTHIAZIDE 25 MG PO TABS
25.0000 mg | ORAL_TABLET | Freq: Every day | ORAL | Status: DC
  Administered 2016-11-18 – 2016-11-19 (×2): 25 mg via ORAL
  Filled 2016-11-18 (×2): qty 1

## 2016-11-18 MED ORDER — LISINOPRIL 20 MG PO TABS
40.0000 mg | ORAL_TABLET | Freq: Every day | ORAL | Status: DC
  Administered 2016-11-18 – 2016-11-20 (×3): 40 mg via ORAL
  Filled 2016-11-18 (×3): qty 2

## 2016-11-18 MED ORDER — IOPAMIDOL (ISOVUE-370) INJECTION 76%
INTRAVENOUS | Status: AC
  Filled 2016-11-18: qty 100

## 2016-11-18 MED ORDER — SODIUM CHLORIDE 0.9 % IV SOLN
1000.0000 mL | Freq: Once | INTRAVENOUS | Status: AC
  Administered 2016-11-18: 1000 mL via INTRAVENOUS

## 2016-11-18 MED ORDER — ONDANSETRON HCL 4 MG/2ML IJ SOLN
4.0000 mg | Freq: Once | INTRAMUSCULAR | Status: AC
  Administered 2016-11-18: 4 mg via INTRAVENOUS
  Filled 2016-11-18: qty 2

## 2016-11-18 MED ORDER — SODIUM CHLORIDE 0.9 % IV SOLN
1000.0000 mL | INTRAVENOUS | Status: DC
  Administered 2016-11-18: 1000 mL via INTRAVENOUS

## 2016-11-18 MED ORDER — SODIUM CHLORIDE 0.9 % IV SOLN: INTRAVENOUS | Status: DC

## 2016-11-18 MED ORDER — IBUPROFEN 200 MG PO TABS
400.0000 mg | ORAL_TABLET | Freq: Four times a day (QID) | ORAL | Status: DC | PRN
  Administered 2016-11-18: 400 mg via ORAL
  Filled 2016-11-18: qty 2

## 2016-11-18 MED ORDER — SODIUM CHLORIDE 0.9 % IV BOLUS (SEPSIS)
1000.0000 mL | Freq: Once | INTRAVENOUS | Status: AC
  Administered 2016-11-18: 1000 mL via INTRAVENOUS

## 2016-11-18 MED ORDER — NITROGLYCERIN 0.4 MG SL SUBL: 0.4000 mg | SUBLINGUAL_TABLET | SUBLINGUAL | Status: DC | PRN

## 2016-11-18 MED ORDER — HYDROMORPHONE HCL 1 MG/ML IJ SOLN
1.0000 mg | Freq: Once | INTRAMUSCULAR | Status: AC
  Administered 2016-11-18: 1 mg via INTRAVENOUS
  Filled 2016-11-18: qty 1

## 2016-11-18 MED ORDER — IOPAMIDOL (ISOVUE-370) INJECTION 76%
100.0000 mL | Freq: Once | INTRAVENOUS | Status: AC | PRN
  Administered 2016-11-18: 80 mL via INTRAVENOUS

## 2016-11-18 MED ORDER — MORPHINE SULFATE (PF) 2 MG/ML IV SOLN
2.0000 mg | INTRAVENOUS | Status: DC | PRN
  Administered 2016-11-19: 2 mg via INTRAVENOUS
  Filled 2016-11-18: qty 1

## 2016-11-18 MED ORDER — METOPROLOL SUCCINATE ER 25 MG PO TB24
25.0000 mg | ORAL_TABLET | Freq: Every day | ORAL | Status: DC
  Administered 2016-11-18 – 2016-11-20 (×3): 25 mg via ORAL
  Filled 2016-11-18 (×3): qty 1

## 2016-11-18 MED ORDER — ONDANSETRON HCL 4 MG/2ML IJ SOLN: 4.0000 mg | Freq: Four times a day (QID) | INTRAMUSCULAR | Status: DC | PRN

## 2016-11-18 MED ORDER — HYDRALAZINE HCL 20 MG/ML IJ SOLN
10.0000 mg | INTRAMUSCULAR | Status: DC | PRN
  Administered 2016-11-18 – 2016-11-19 (×2): 10 mg via INTRAVENOUS
  Filled 2016-11-18 (×2): qty 1

## 2016-11-18 MED ORDER — DIAZEPAM 5 MG PO TABS
5.0000 mg | ORAL_TABLET | Freq: Once | ORAL | Status: AC
  Administered 2016-11-18: 5 mg via ORAL
  Filled 2016-11-18: qty 1

## 2016-11-18 MED ORDER — ALBUTEROL SULFATE (2.5 MG/3ML) 0.083% IN NEBU: 3.0000 mL | INHALATION_SOLUTION | Freq: Four times a day (QID) | RESPIRATORY_TRACT | Status: DC | PRN

## 2016-11-18 MED ORDER — ASPIRIN 81 MG PO CHEW
324.0000 mg | CHEWABLE_TABLET | Freq: Once | ORAL | Status: AC
  Administered 2016-11-18: 324 mg via ORAL
  Filled 2016-11-18: qty 4

## 2016-11-18 NOTE — Progress Notes (Signed)
Entered in d/c instructions Please use the resources provided to you in emergency room by case manager to assist you're your choice of doctor for follow up        Next Steps: Schedule an appointment as soon as possible for a visit on 11/18/2016  Instructions: as needed

## 2016-11-18 NOTE — H&P (Signed)
TRH H&P   Patient Demographics:    [name], is a 55 y.o. male  MRN: [id number]   DOB - November 21, 1961  Admit Date - 11/18/2016  Outpatient Primary MD for the patient is No PCP Per Patient  Referring MD/NP/PA: Dr Bjorn Loser  Patient coming from: Home  Chief Complaint  Patient presents with  . Back Pain      HPI:    [name]  is a 55 y.o. male, With past medical history of hypertension, presents secondary to complaints of lower back pain, reports he did slip on ice and fall last Saturday, as well patient reports intermittent chest pain, most recent episode this a.m. nonradiating, sharp, midsternal, no relieving or provoking factor, he reports mild dyspnea, and diaphoresis, reports cough, denies any fever or chills productive sputum or hemoptysis. - In ED, WBC 21,000, he denies any dysuria or polyuria, negative urinalysis ,CT chest with no evidence of PE or pneumonia, troponins initially 0.05, repeat 0.03, currently denies any chest pain, was tachycardic in the 120s to 130s, blood pressure uncontrolled systolic in the 383A, reports he ran out of his antihypertensive medication over the last 24 hour.    Review of systems:    In addition to the HPI above,  No Fever-chills, No Headache, No changes with Vision or hearing, No problems swallowing food or Liquids, Reports intermittent Chest pain, complains of Cough and Shortness of Breath, No Abdominal pain, No Nausea or Vommitting, Bowel movements are regular, No Blood in stool or Urine, No dysuria, No new skin rashes or bruises, No new joints pains-aches, complaints of lower back pain No new weakness, tingling, numbness in any extremity, No recent weight gain or loss, No polyuria, polydypsia or polyphagia, No significant Mental Stressors.  A full 10 point Review of Systems was done, except as stated above, all other Review of  Systems were negative.   With Past History of the following :    Past Medical History:  Diagnosis Date  . Hypertension       Past Surgical History:  Procedure Laterality Date  . ADRENAL GLAND SURGERY    . NASAL HEMORRHAGE CONTROL N/A 02/22/2016   Procedure: EPISTAXIS CONTROL;  Surgeon: Izora Gala, MD;  Location: [facility] OR;  Service: ENT;  Laterality: N/A;      Social History:     Social History  Substance Use Topics  . Smoking status: Current Every Day Smoker    Packs/day: 1.00    Types: Cigarettes  . Smokeless tobacco: Never Used  . Alcohol use Yes     Lives - At home  Mobility - independent     Family History :   Denies any family history for coronary artery disease at young age   Home Medications:   Prior to Admission medications   Medication Sig Start Date End Date Taking? Authorizing Provider  albuterol (PROVENTIL HFA;VENTOLIN HFA) 108 (90 Base)  MCG/ACT inhaler Inhale 2 puffs into the lungs every 6 (six) hours as needed for wheezing or shortness of breath.   Yes Historical Provider, MD  hydrochlorothiazide (HYDRODIURIL) 25 MG tablet Take 1 tablet (25 mg total) by mouth daily. 02/21/16  Yes Davonna Belling, MD  lisinopril (PRINIVIL,ZESTRIL) 40 MG tablet Take 1 tablet (40 mg total) by mouth daily. 1/93/79  Yes Jodi Marble, MD  metoprolol succinate (TOPROL-XL) 25 MG 24 hr tablet Take 1 tablet (25 mg total) by mouth daily. 0/24/09  Yes Jodi Marble, MD  azithromycin (ZITHROMAX) 250 MG tablet Take 1 tablet (250 mg total) by mouth daily. Take first 2 tablets together, then 1 every day until finished. Patient not taking: Reported on 11/18/2016 09/12/16   Montine Circle, PA-C     Allergies:    No Known Allergies   Physical Exam:   Vitals  Blood pressure (!) 194/110, pulse 102, temperature 97.5 F (36.4 C), resp. rate 18, SpO2 97 %.   1. General Well-developed male lying in bed in NAD,   2. Normal affect and insight, Not Suicidal or Homicidal, Awake  Alert, Oriented X 3.  3. No F.N deficits, ALL C.Nerves Intact, Strength 5/5 all 4 extremities, Sensation intact all 4 extremities, Plantars down going.  4. Ears and Eyes appear Normal, Conjunctivae clear, PERRLA. Moist Oral Mucosa.  5. Supple Neck, No JVD, No cervical lymphadenopathy appriciated, No Carotid Bruits.  6. Symmetrical Chest wall movement, Good air movement bilaterally, CTAB.  7. RRR, No Gallops, Rubs or Murmurs, No Parasternal Heave.  8. Positive Bowel Sounds, Abdomen Soft, No tenderness, No organomegaly appriciated,No rebound -guarding or rigidity.  9.  No Cyanosis, Normal Skin Turgor, No Skin Rash or Bruise.  10. Good muscle tone,  joints appear normal , no effusions, Normal ROM.  11. No Palpable Lymph Nodes in Neck or Axillae     Data Review:    CBC  Recent Labs Lab 11/18/16 1138  WBC 21.6*  HGB 17.8*  HCT 50.4  PLT 274  MCV 86.7  MCH 30.6  MCHC 35.3  RDW 14.4  LYMPHSABS 1.1  MONOABS 1.5*  EOSABS 0.0  BASOSABS 0.1   ------------------------------------------------------------------------------------------------------------------  Chemistries   Recent Labs Lab 11/18/16 1138  NA 137  K 4.1  CL 98*  CO2 24  GLUCOSE 138*  BUN 15  CREATININE 1.03  CALCIUM 9.6   ------------------------------------------------------------------------------------------------------------------ CrCl cannot be calculated (Unknown ideal weight.). ------------------------------------------------------------------------------------------------------------------ No results for input(s): TSH, T4TOTAL, T3FREE, THYROIDAB in the last 72 hours.  Invalid input(s): FREET3  Coagulation profile No results for input(s): INR, PROTIME in the last 168 hours. ------------------------------------------------------------------------------------------------------------------- No results for input(s): DDIMER in the last 72  hours. -------------------------------------------------------------------------------------------------------------------  Cardiac Enzymes  Recent Labs Lab 11/18/16 1138 11/18/16 1522  TROPONINI 0.05* 0.03*   ------------------------------------------------------------------------------------------------------------------ No results found for: BNP   ---------------------------------------------------------------------------------------------------------------  Urinalysis    Component Value Date/Time   COLORURINE YELLOW 11/18/2016 [place] 11/18/2016 1446   LABSPEC 1.010 11/18/2016 1446   PHURINE 6.0 11/18/2016 1446   GLUCOSEU NEGATIVE 11/18/2016 1446   HGBUR SMALL (A) 11/18/2016 1446   BILIRUBINUR NEGATIVE 11/18/2016 1446   KETONESUR 5 (A) 11/18/2016 1446   PROTEINUR 30 (A) 11/18/2016 1446   NITRITE NEGATIVE 11/18/2016 1446   LEUKOCYTESUR NEGATIVE 11/18/2016 1446    ----------------------------------------------------------------------------------------------------------------   Imaging Results:    Dg Chest 2 View  Result Date: 11/18/2016 CLINICAL DATA:  Shortness of breath. EXAM: CHEST  2 VIEW COMPARISON:  09/12/2016. FINDINGS: Mediastinum and hilar structures  normal. Mild left base subsegmental atelectasis and/or scarring. Heart size normal. No pleural effusion or pneumothorax. Degenerative changes thoracic spine. IMPRESSION: 1. Mild left base subsegmental atelectasis and/or scarring. 2. Exam is otherwise unremarkable. Electronically Signed   By: Salineno North   On: 11/18/2016 11:47   Dg Lumbar Spine Complete  Result Date: 11/18/2016 CLINICAL DATA:  Injury. EXAM: LUMBAR SPINE - COMPLETE 4+ VIEW COMPARISON:  No recent prior . FINDINGS: Diffuse multilevel degenerative change. Mild scoliosis. No acute abnormality identified. Normal alignment and mineralization. Aortic atherosclerotic vascular calcification. IMPRESSION: Diffuse multilevel degenerative  change. Mild scoliosis. No acute abnormality identified. Electronically Signed   By: Marcello Moores  Register   On: 11/18/2016 11:49   Dg Pelvis 1-2 Views  Result Date: 11/18/2016 CLINICAL DATA:  [place] now with left low back pain EXAM: PELVIS - 1-2 VIEW COMPARISON:  None. FINDINGS: The hips are normally positioned with normal joint spaces. The pelvic rami are intact. The SI joints are corticated. The sacral foramina are unremarkable. No acute bony abnormality is seen. IMPRESSION: Negative. Electronically Signed   By: Ivar Drape M.D.   On: 11/18/2016 11:44   Ct Angio Chest Pe W And/or Wo Contrast  Result Date: 11/18/2016 CLINICAL DATA:  Shortness of breath, tachycardia. EXAM: CT ANGIOGRAPHY CHEST WITH CONTRAST TECHNIQUE: Multidetector CT imaging of the chest was performed using the standard protocol during bolus administration of intravenous contrast. Multiplanar CT image reconstructions and MIPs were obtained to evaluate the vascular anatomy. CONTRAST:  80 mL of Isovue 370 intravenously. COMPARISON:  Chest radiographs of same day. FINDINGS: Cardiovascular: There is no evidence of thoracic aortic dissection or aneurysm. There is no evidence of pulmonary embolus. Mediastinum/Nodes: No enlarged mediastinal, hilar, or axillary lymph nodes. Thyroid gland, trachea, and esophagus demonstrate no significant findings. Lungs/Pleura: Lungs are clear. No pleural effusion or pneumothorax. Upper Abdomen: Bilateral adrenal masses are noted, right greater than left, with Hounsfield measurements consistent with adenomas. Otherwise normal upper abdomen. Musculoskeletal: Old left lower posterior rib fracture is noted. No acute abnormality seen. Review of the MIP images confirms the above findings. IMPRESSION: No evidence of pulmonary embolus. No significant abnormality seen in the chest. Electronically Signed   By: Marijo Conception, M.D.   On: 11/18/2016 15:34    My personal review of EKG: Rhythm NSR, Rate  131 /min, QTc 451 , no  Acute ST changes   Assessment & Plan:    Active Problems:   Hypertensive urgency   Lower back pain   Elevated troponin   Chest pain   Leukocytosis  Hypertensive urgency - Since with uncontrolled pressure, as urine out of his meds over last 24 hours, resume back on his home medication, will add when necessary hydralazine for better control overnight. - As well uncontrolled pain contributing to his uncontrolled blood pressure, started on pain management as needed  Elevated troponin - Most likely demand ischemia from uncontrolled hypertension. - Monitor in telemetry, continue to cycle cardiac enzymes and follow the trend, check 2-D echo. - CT chest negative for PE.  Chest pain - Has nontypical features, only brief episode this a.m., no relieving or provoking factors, sharp in quality. - Received full dose aspirin in ED, follow on 2-D echo. - Repeat EKG in a.m.  Leukocytosis - Most likely stress related from fall, he is afebrile, negative urinalysis, CT chest with no acute cardiopulmonary disease, check blood cultures, will check ESR and CRP and pro-calcitonin to rule out infection. - Repeat CBC in a.m.  Lower back pain -  Continue to follow, continue with when necessary morphine and Advil   DVT Prophylaxis Heparin -   SCDs  AM Labs Ordered, also please review Full Orders  Family Communication: Admission, patients condition and plan of care including tests being ordered have been discussed with the patient who indicate understanding and agree with the plan and Code Status.  Code Status Full  Likely DC to  Home  Condition GUARDED    Consults called: none  Admission status: observation  Time spent in minutes : 55 miutes   ELGERGAWY, DAWOOD M.D on 11/18/2016 at 5:32 PM  Between 7am to 7pm - Pager - [phone]. After 7pm go to [email or web address] - password [facility]  [facility] - Office  [phone]

## 2016-11-18 NOTE — ED Provider Notes (Signed)
[facility]-EMERGENCY DEPT Provider Note   CSN: [id number] Arrival date & time: 11/18/16  1048     History   Chief Complaint Chief Complaint  Patient presents with  . Back Pain    HPI [name] is a 55 y.o. male.  [name] is a 55 y.o. male with history of HTN presents to ED with complaint of back pain. Patient reports slipping and falling on the ice on Saturday. He complains of left lower back. Patient was medically screened in fast track and found to be tachycardic and hypertensive; he complained of shortness of breath and CP. He was subsequently to the main ED. Patient reports a "heavy weight" and "tightness" in his chest. He had a "collapsed lung" a few years ago and states it feels the same. He endorses associated palpitations, SOB, nonproductive cough, and wheezing. He also reports he has run out of three of his BP medications. Last night he reports drinking a 12 pack of beer to help with his back pain; however, feels worse today and is nauseous. He denies any cardiac history, high cholesterol, diabetes, or family history of cardiac conditions. He is a current smoker. No recent long distance travel/surgery/hospitalization, h/o blood clots, h/o cancer, h/o IVDU, or hormone therapy. Denies fever, leg swelling/pain, abdominal pain, dysuria, hematuria, numbness, weakness, loss of bowel/bladder control, saddle anesthesia.       Past Medical History:  Diagnosis Date  . Hypertension     Patient Active Problem List   Diagnosis Date Noted  . Hypertensive urgency 11/18/2016  . Epistaxis 02/22/2016  . Uncontrolled hypertension 02/22/2016    Past Surgical History:  Procedure Laterality Date  . ADRENAL GLAND SURGERY    . NASAL HEMORRHAGE CONTROL N/A 02/22/2016   Procedure: EPISTAXIS CONTROL;  Surgeon: Serena Colonel, MD;  Location: [facility] OR;  Service: ENT;  Laterality: N/A;       Home Medications    Prior to Admission medications   Medication Sig Start Date End Date Taking?  Authorizing Provider  albuterol (PROVENTIL HFA;VENTOLIN HFA) 108 (90 Base) MCG/ACT inhaler Inhale 2 puffs into the lungs every 6 (six) hours as needed for wheezing or shortness of breath.   Yes Historical Provider, MD  hydrochlorothiazide (HYDRODIURIL) 25 MG tablet Take 1 tablet (25 mg total) by mouth daily. 02/21/16  Yes Benjiman Core, MD  lisinopril (PRINIVIL,ZESTRIL) 40 MG tablet Take 1 tablet (40 mg total) by mouth daily. 02/24/16  Yes Flo Shanks, MD  metoprolol succinate (TOPROL-XL) 25 MG 24 hr tablet Take 1 tablet (25 mg total) by mouth daily. 02/24/16  Yes Flo Shanks, MD  azithromycin (ZITHROMAX) 250 MG tablet Take 1 tablet (250 mg total) by mouth daily. Take first 2 tablets together, then 1 every day until finished. Patient not taking: Reported on 11/18/2016 09/12/16   Roxy Horseman, PA-C    Family History No family history on file.  Social History Social History  Substance Use Topics  . Smoking status: Current Every Day Smoker    Packs/day: 1.00    Types: Cigarettes  . Smokeless tobacco: Never Used  . Alcohol use Yes     Allergies   Patient has no known allergies.   Review of Systems Review of Systems  Constitutional: Positive for diaphoresis. Negative for fever.  HENT: Negative for trouble swallowing.   Eyes: Negative for visual disturbance.  Respiratory: Positive for cough, shortness of breath and wheezing.   Cardiovascular: Positive for chest pain and palpitations. Negative for leg swelling.  Gastrointestinal: Positive for nausea. Negative for  abdominal pain and vomiting.  Genitourinary: Negative for dysuria and hematuria.  Musculoskeletal: Positive for back pain.  Skin: Negative for rash.  Neurological: Positive for numbness ( "tingling in hands and feet"). Negative for dizziness, syncope, speech difficulty, weakness and light-headedness.     Physical Exam Updated Vital Signs BP (!) 180/113 (BP Location: Left Arm)   Pulse (!) 126   Temp 97.5 F (36.4  C)   Resp 20   SpO2 95%   Physical Exam  Constitutional: He appears well-developed and well-nourished. No distress.  HENT:  Head: Normocephalic and atraumatic.  Mouth/Throat: Uvula is midline and oropharynx is clear and moist. Mucous membranes are dry. No trismus in the jaw. No oropharyngeal exudate.  Eyes: Conjunctivae and EOM are normal. Pupils are equal, round, and reactive to light. Right eye exhibits no discharge. Left eye exhibits no discharge. No scleral icterus.  Neck: Normal range of motion and phonation normal. Neck supple. No neck rigidity. Normal range of motion present.  Cardiovascular: Regular rhythm, normal heart sounds and intact distal pulses.  Tachycardia present.   No murmur heard. Pulmonary/Chest: Effort normal and breath sounds normal. No stridor. No respiratory distress. He has no wheezes. He has no rales.  Abdominal: Soft. Bowel sounds are normal. He exhibits no distension. There is no tenderness. There is no rigidity, no rebound, no guarding and no CVA tenderness.  Musculoskeletal: Normal range of motion.  No midline spinal tenderness. Mild TTP of left lower back.   Lymphadenopathy:    He has no cervical adenopathy.  Neurological: He is alert. He is not disoriented. Coordination and gait normal. GCS eye subscore is 4. GCS verbal subscore is 5. GCS motor subscore is 6.  CN 2-12 grossly intact. Moves all extremities with ease. Strength and sensation grossly intact and equal.   Skin: Skin is warm. He is diaphoretic.  Psychiatric: He has a normal mood and affect. His behavior is normal.     ED Treatments / Results  Labs (all labs ordered are listed, but only abnormal results are displayed) Labs Reviewed  BASIC METABOLIC PANEL - Abnormal; Notable for the following:       Result Value   Chloride 98 (*)    Glucose, Bld 138 (*)    All other components within normal limits  CBC WITH DIFFERENTIAL/PLATELET - Abnormal; Notable for the following:    WBC 21.6 (*)     Hemoglobin 17.8 (*)    Neutro Abs 19.0 (*)    Monocytes Absolute 1.5 (*)    All other components within normal limits  TROPONIN I - Abnormal; Notable for the following:    Troponin I 0.05 (*)    All other components within normal limits  URINALYSIS, ROUTINE W REFLEX MICROSCOPIC - Abnormal; Notable for the following:    Hgb urine dipstick SMALL (*)    Ketones, ur 5 (*)    Protein, ur 30 (*)    Bacteria, UA RARE (*)    All other components within normal limits  TROPONIN I - Abnormal; Notable for the following:    Troponin I 0.03 (*)    All other components within normal limits  CULTURE, BLOOD (ROUTINE X 2)  CULTURE, BLOOD (ROUTINE X 2)  SEDIMENTATION RATE  C-REACTIVE PROTEIN  PROCALCITONIN    EKG  EKG Interpretation  Date/Time:  Tuesday November 18 2016 11:45:38 EST Ventricular Rate:  131 PR Interval:    QRS Duration: 93 QT Interval:  305 QTC Calculation: 451 R Axis:   -108 Text Interpretation:  Sinus tachycardia Markedly posterior QRS axis No significant change was found Confirmed by CAMPOS  MD, Caryn BeeKEVIN ([id number]) on 11/18/2016 1:14:04 PM       Radiology Dg Chest 2 View  Result Date: 11/18/2016 CLINICAL DATA:  Shortness of breath. EXAM: CHEST  2 VIEW COMPARISON:  09/12/2016. FINDINGS: Mediastinum and hilar structures normal. Mild left base subsegmental atelectasis and/or scarring. Heart size normal. No pleural effusion or pneumothorax. Degenerative changes thoracic spine. IMPRESSION: 1. Mild left base subsegmental atelectasis and/or scarring. 2. Exam is otherwise unremarkable. Electronically Signed   By: Maisie Fushomas  Register   On: 11/18/2016 11:47   Dg Lumbar Spine Complete  Result Date: 11/18/2016 CLINICAL DATA:  Injury. EXAM: LUMBAR SPINE - COMPLETE 4+ VIEW COMPARISON:  No recent prior . FINDINGS: Diffuse multilevel degenerative change. Mild scoliosis. No acute abnormality identified. Normal alignment and mineralization. Aortic atherosclerotic vascular calcification.  IMPRESSION: Diffuse multilevel degenerative change. Mild scoliosis. No acute abnormality identified. Electronically Signed   By: Maisie Fushomas  Register   On: 11/18/2016 11:49   Dg Pelvis 1-2 Views  Result Date: 11/18/2016 CLINICAL DATA:  Larey SeatFell now with left low back pain EXAM: PELVIS - 1-2 VIEW COMPARISON:  None. FINDINGS: The hips are normally positioned with normal joint spaces. The pelvic rami are intact. The SI joints are corticated. The sacral foramina are unremarkable. No acute bony abnormality is seen. IMPRESSION: Negative. Electronically Signed   By: Dwyane DeePaul  Barry M.D.   On: 11/18/2016 11:44   Ct Angio Chest Pe W And/or Wo Contrast  Result Date: 11/18/2016 CLINICAL DATA:  Shortness of breath, tachycardia. EXAM: CT ANGIOGRAPHY CHEST WITH CONTRAST TECHNIQUE: Multidetector CT imaging of the chest was performed using the standard protocol during bolus administration of intravenous contrast. Multiplanar CT image reconstructions and MIPs were obtained to evaluate the vascular anatomy. CONTRAST:  80 mL of Isovue 370 intravenously. COMPARISON:  Chest radiographs of same day. FINDINGS: Cardiovascular: There is no evidence of thoracic aortic dissection or aneurysm. There is no evidence of pulmonary embolus. Mediastinum/Nodes: No enlarged mediastinal, hilar, or axillary lymph nodes. Thyroid gland, trachea, and esophagus demonstrate no significant findings. Lungs/Pleura: Lungs are clear. No pleural effusion or pneumothorax. Upper Abdomen: Bilateral adrenal masses are noted, right greater than left, with Hounsfield measurements consistent with adenomas. Otherwise normal upper abdomen. Musculoskeletal: Old left lower posterior rib fracture is noted. No acute abnormality seen. Review of the MIP images confirms the above findings. IMPRESSION: No evidence of pulmonary embolus. No significant abnormality seen in the chest. Electronically Signed   By: Lupita RaiderJames  Green Jr, M.D.   On: 11/18/2016 15:34    Procedures Procedures  (including critical care time)  Medications Ordered in ED Medications  nitroGLYCERIN (NITROSTAT) SL tablet 0.4 mg (not administered)  0.9 %  sodium chloride infusion (0 mLs Intravenous Stopped 11/18/16 1521)    Followed by  0.9 %  sodium chloride infusion (1,000 mLs Intravenous New Bag/Given 11/18/16 1445)  sodium chloride 0.9 % bolus 1,000 mL (0 mLs Intravenous Stopped 11/18/16 1346)  ondansetron (ZOFRAN) injection 4 mg (4 mg Intravenous Given 11/18/16 1251)  aspirin chewable tablet 324 mg (324 mg Oral Given 11/18/16 1345)  HYDROmorphone (DILAUDID) injection 1 mg (1 mg Intravenous Given 11/18/16 1444)  diazepam (VALIUM) tablet 5 mg (5 mg Oral Given 11/18/16 1443)  iopamidol (ISOVUE-370) 76 % injection 100 mL (80 mLs Intravenous Contrast Given 11/18/16 1509)     Initial Impression / Assessment and Plan / ED Course  I have reviewed the triage vital signs and the nursing notes.  Pertinent labs & imaging results that were available during my care of the patient were reviewed by me and considered in my medical decision making (see chart for details).  Clinical Course as of Nov 18 1705  Tue Nov 18, 2016  1345 DG Lumbar Spine Complete [AM]  1345 DG Chest 2 View [AM]  1345 DG Pelvis 1-2 Views [AM]  1529 CT Angio Chest PE W and/or Wo Contrast [AM]    Clinical Course User Index [AM] Lona KettleAshley Laurel Linzie Criss, PA-C    Patient presents to ED with complaint of low back pain s/p fall x 3 days ago; however, in Fast Track patient found to be tachycardic, hypertensive, and short of breath; patient endorsed CP and SOB. Patient is afebrile and non-toxic appearing. His face is flushed and he appears diaphoretic. Patient is hypertensive and tachycardic. Lungs are CTABL. Initial troponin elevated at 0.05.  EKG remarkable for sinus tachycardia and posterior QRS, no significant change from previous. CXR negative for PNA, pleural effusion, or PTX. Degenerative changes noted in both thoracic and lumbar spine. CBC  remarkable for leukocytosis - no UTI or PNA on CXR. With mildly elevated troponin, tachycardia, and tachypnea will order CTA to r/o PE. Discussed patient with Dr. Patria Maneampos, who also evaluated patient, agrees with plan.   CTA negative for PE, thoracic dissection or aneurysm. On re-evaluation, patient endorses improvement in pain. Repeat troponin 0.03. Will consult hosptialist for ACS r/o.   4:43 PM: [facility], greatly appreciate his time and input. Agree to admit patient for ACS r/o. Thank you for your continued care of this patient.   Final Clinical Impressions(s) / ED Diagnoses   Final diagnoses:  Acute left-sided low back pain, with sciatica presence unspecified  Chest pain, unspecified type    New Prescriptions New Prescriptions   No medications on file     Lona Kettleshley Laurel Curvin Hunger, PA-C 11/18/16 1706    Azalia BilisKevin Campos, MD 11/19/16 [phone]

## 2016-11-18 NOTE — ED Triage Notes (Signed)
Pt returned from radiology.

## 2016-11-18 NOTE — ED Notes (Signed)
Made Dr Freida BusmanAllen aware of troponin

## 2016-11-18 NOTE — Progress Notes (Signed)
Pt confirms with ED CM he does not have a [employer] pcp States he has been going to his employee health staff at his job [employer] Cm discussed importance of having a pcp for f/u care and the ED standard of requesting pts f/u with pcp  Pt agreed to receive a list of [employer] pcp to assist with ED f/u care and a blanket to put under his head

## 2016-11-18 NOTE — ED Notes (Signed)
Patient came in with back pain but had elevated blood pressure.

## 2016-11-18 NOTE — ED Triage Notes (Addendum)
Patient states that couple days ago he slipped on the ice and been having lower back pain ever since.  Patient states that ASA nor 12 pack last night helped with pain.  Patient states that pain is worse with movement.  Patient states that he has HTN and takes medication but has ran out so hasnt had any to take today

## 2016-11-18 NOTE — ED Notes (Signed)
Will collect vitals when pt. Returns from x-ray. Nurse aware.

## 2016-11-18 NOTE — ED Provider Notes (Signed)
MSE was initiated and I personally evaluated the patient and placed orders (if any) at  11:22 AM on November 18, 2016.   Patient seen in fast track.  Pt c/o left lower back pain after slipping on ice and falling onto his back 3 days ago.  However, he has been drinking excessive ETOH to help his pain and has also run out of his three blood pressure medications (metoprolol, lisinopril, HCTZ).  He is currently vomiting and SOB.  Also notes CP.  He is tachycardic and hypertensive.    Vitals:   11/18/16 1057  BP: (!) 189/156  Pulse: (!) 137  Resp: 18  Temp: 97.5 F (36.4 C)     CBC, BMP, IVF, zofran, xrays, EKG, cardiac monitoring ordered.    The patient appears stable so that the remainder of the MSE may be completed by another provider   Trixie DredgeEmily Markell Schrier, PA-C 11/18/16 1125    Lorre NickAnthony Allen, MD 11/18/16 [phone]

## 2016-11-18 NOTE — ED Notes (Signed)
Patient transferred to room. Report given to floor RN

## 2016-11-19 ENCOUNTER — Observation Stay (HOSPITAL_BASED_OUTPATIENT_CLINIC_OR_DEPARTMENT_OTHER): Payer: BLUE CROSS/BLUE SHIELD

## 2016-11-19 DIAGNOSIS — I160 Hypertensive urgency: Secondary | ICD-10-CM

## 2016-11-19 DIAGNOSIS — I10 Essential (primary) hypertension: Secondary | ICD-10-CM | POA: Diagnosis not present

## 2016-11-19 DIAGNOSIS — R0602 Shortness of breath: Secondary | ICD-10-CM | POA: Diagnosis not present

## 2016-11-19 DIAGNOSIS — M5442 Lumbago with sciatica, left side: Secondary | ICD-10-CM | POA: Diagnosis not present

## 2016-11-19 DIAGNOSIS — R748 Abnormal levels of other serum enzymes: Secondary | ICD-10-CM | POA: Diagnosis not present

## 2016-11-19 DIAGNOSIS — D72829 Elevated white blood cell count, unspecified: Secondary | ICD-10-CM

## 2016-11-19 DIAGNOSIS — R7989 Other specified abnormal findings of blood chemistry: Secondary | ICD-10-CM | POA: Diagnosis not present

## 2016-11-19 DIAGNOSIS — M545 Low back pain: Secondary | ICD-10-CM

## 2016-11-19 LAB — CBC
HEMATOCRIT: 48.3 % (ref 39.0–52.0)
HEMOGLOBIN: 16.5 g/dL (ref 13.0–17.0)
MCH: 30.1 pg (ref 26.0–34.0)
MCHC: 34.2 g/dL (ref 30.0–36.0)
MCV: 88.1 fL (ref 78.0–100.0)
Platelets: 229 10*3/uL (ref 150–400)
RBC: 5.48 MIL/uL (ref 4.22–5.81)
RDW: 14.6 % (ref 11.5–15.5)
WBC: 11 10*3/uL — AB (ref 4.0–10.5)

## 2016-11-19 LAB — BASIC METABOLIC PANEL
ANION GAP: 9 (ref 5–15)
BUN: 13 mg/dL (ref 6–20)
CALCIUM: 9 mg/dL (ref 8.9–10.3)
CHLORIDE: 101 mmol/L (ref 101–111)
CO2: 26 mmol/L (ref 22–32)
Creatinine, Ser: 0.94 mg/dL (ref 0.61–1.24)
GFR calc non Af Amer: >60 mL/min (ref >60)
Glucose, Bld: 93 mg/dL (ref 65–99)
Potassium: 3.7 mmol/L (ref 3.5–5.1)
SODIUM: 136 mmol/L (ref 135–145)

## 2016-11-19 LAB — ECHOCARDIOGRAM COMPLETE
Height: 74 in
Weight: 4000.03 oz

## 2016-11-19 LAB — TROPONIN I: Troponin I: 0.03 ng/mL (ref <0.03)

## 2016-11-19 MED ORDER — SODIUM CHLORIDE 0.9 % IV BOLUS (SEPSIS)
500.0000 mL | Freq: Once | INTRAVENOUS | Status: AC
  Administered 2016-11-19: 500 mL via INTRAVENOUS

## 2016-11-19 MED ORDER — AMLODIPINE BESYLATE 5 MG PO TABS
5.0000 mg | ORAL_TABLET | Freq: Every day | ORAL | Status: DC
  Administered 2016-11-19 – 2016-11-20 (×2): 5 mg via ORAL
  Filled 2016-11-19 (×2): qty 1

## 2016-11-19 MED ORDER — HYDROCHLOROTHIAZIDE 25 MG PO TABS
50.0000 mg | ORAL_TABLET | Freq: Every day | ORAL | Status: DC
  Administered 2016-11-20: 50 mg via ORAL
  Filled 2016-11-19: qty 2

## 2016-11-19 MED ORDER — HYDROCHLOROTHIAZIDE 25 MG PO TABS
25.0000 mg | ORAL_TABLET | Freq: Once | ORAL | Status: AC
  Administered 2016-11-19: 25 mg via ORAL
  Filled 2016-11-19: qty 1

## 2016-11-19 MED ORDER — HYDRALAZINE HCL 20 MG/ML IJ SOLN
10.0000 mg | Freq: Once | INTRAMUSCULAR | Status: AC
  Administered 2016-11-19: 10 mg via INTRAVENOUS
  Filled 2016-11-19: qty 1

## 2016-11-19 MED ORDER — ZOLPIDEM TARTRATE 5 MG PO TABS
5.0000 mg | ORAL_TABLET | Freq: Every evening | ORAL | Status: DC | PRN
  Filled 2016-11-19: qty 1

## 2016-11-19 NOTE — Progress Notes (Signed)
PROGRESS NOTE  [name]  QQV:[id number] DOB: [birth year] DOA: 11/18/2016 PCP: No PCP Per Patient  Outpatient Specialists:  Brief Narrative: [name]  is a [age] y.o. male, With past medical history of hypertension, presents secondary to complaints of lower back pain, reports he did slip on ice and fall last Saturday, as well patient reports intermittent chest pain, most recent episode this a.m. nonradiating, sharp, midsternal, no relieving or provoking factor, he reports mild dyspnea, and diaphoresis, reports cough, denies any fever or chills productive sputum or hemoptysis. - In ED, WBC 21,000, he denies any dysuria or polyuria, negative urinalysis ,CT chest with no evidence of PE or pneumonia, troponins initially 0.05, repeat 0.03, currently denies any chest pain, was tachycardic in the 120s to 130s, blood pressure uncontrolled systolic in the 190s, reported he ran out of his antihypertensive medications.  Assessment & Plan: Active Problems:   Hypertensive urgency   Lower back pain   Elevated troponin   Chest pain   Leukocytosis  Hypertensive urgency: Resolved. BP's improving at acceptable rate, 140's/90's today. 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 He does not recognize the term pheochromocytoma and CT this admission shows bilateral adrenal lesions consistent with adenomas.  - Continued home medications, which he had run out of, with limited improvement. Required hydralazine prn x2 overnight.  - Will increase HCTZ and add low dose norvasc. If BPs stable, can DC in AM.  - To help with compliance, may consider combination pill depending on insurance coverage.  - Echo with severe LVH indicating chronically uncontrolled HTN.  Elevated troponin: CT chest negative for PE. Suspect demand ischemia from severe HTN. Troponins 0.03 x4.  - Echocardiogram ordered: No regional wall motion  abnormalities noted.    Chest pain: Resolved spontaneously.   Leukocytosis: Improving. Suspect due to stress from fall. No further SIRS. CRP, PCT negative.  - Monitor blood cultures - Repeat CBC in AM  Lower back pain: A chronic issue, with negative acute findings on XR's but diffuse multilevel DJD.  - Continue advil prn  DVT prophylaxis: Heparin Code Status: Full Family Communication: None at bedside this AM Disposition Plan: Discharge to home in next 24 hours if BPs stable.   Consultants:   None  Procedures:   Echocardiogram 11/19/2016:  - Left ventricle: The cavity size was normal. Wall thickness was   increased in a pattern of severe LVH. Systolic function was   normal. The estimated ejection fraction was in the range of 60%   to 65%. Wall motion was normal; there were no regional wall   motion abnormalities. Left ventricular diastolic function   parameters were normal. - Atrial septum: No defect or patent foramen ovale was identified.  Antimicrobials:  None  Subjective: Pt without dyspnea or chest pain. Lower back still painful but this has been the case for decades. No bowel/bladder problems, fever, chills, unintentional weight loss, night time awakenings secondary to pain, weakness in one or both legs  Objective: Vitals:   11/19/16 0224 11/19/16 0318 11/19/16 0534 11/19/16 0840  BP: (!) 156/104 140/80 (!) 157/110 (!) 138/98  Pulse: 86  90 90  Resp: 18  18   Temp: 98 F (36.7 C)  97.9 F (36.6 C)   TempSrc: Oral  Oral   SpO2: 100%  99%   Weight:      Height:  Intake/Output Summary (Last 24 hours) at 11/19/16 1017 Last data filed at 11/19/16 0954  Gross per 24 hour  Intake          3208.34 ml  Output             1475 ml  Net          1733.34 ml   Filed Weights   11/18/16 1736  Weight: 113.4 kg (250 lb)    Examination: General exam: 55 y.o. male in no distress  Respiratory system: Non-labored breathing room air. Clear to auscultation  bilaterally.  Cardiovascular system: Regular rate and rhythm. No murmur, rub, or gallop. no JVD, and no pedal edema. Gastrointestinal system: Abdomen soft, non-tender, non-distended, with normoactive bowel sounds. No organomegaly or masses felt. Central nervous system: Alert and oriented. No focal neurological deficits. Extremities: Warm, no deformities Skin: No rashes, lesions no ulcers Psychiatry: Judgement and insight appear normal. Mood & affect appropriate.   Data Reviewed: I have personally reviewed following labs and imaging studies  CBC:  Recent Labs Lab 11/18/16 1138 11/19/16 0809  WBC 21.6* 11.0*  NEUTROABS 19.0*  --   HGB 17.8* 16.5  HCT 50.4 48.3  MCV 86.7 88.1  PLT 274 229   Basic Metabolic Panel:  Recent Labs Lab 11/18/16 1138 11/19/16 0809  NA 137 136  K 4.1 3.7  CL 98* 101  CO2 24 26  GLUCOSE 138* 93  BUN 15 13  CREATININE 1.03 0.94  CALCIUM 9.6 9.0   GFR: Estimated Creatinine Clearance: 116.1 mL/min (by C-G formula based on SCr of 0.94 mg/dL). Liver Function Tests: No results for input(s): AST, ALT, ALKPHOS, BILITOT, PROT, ALBUMIN in the last 168 hours. No results for input(s): LIPASE, AMYLASE in the last 168 hours. No results for input(s): AMMONIA in the last 168 hours. Coagulation Profile: No results for input(s): INR, PROTIME in the last 168 hours. Cardiac Enzymes:  Recent Labs Lab 11/18/16 1138 11/18/16 1522 11/18/16 1751 11/18/16 2057 11/18/16 2356  TROPONINI 0.05* 0.03* 0.03* 0.03* 0.03*   BNP (last 3 results) No results for input(s): PROBNP in the last 8760 hours. HbA1C: No results for input(s): HGBA1C in the last 72 hours. CBG: No results for input(s): GLUCAP in the last 168 hours. Lipid Profile: No results for input(s): CHOL, HDL, LDLCALC, TRIG, CHOLHDL, LDLDIRECT in the last 72 hours. Thyroid Function Tests: No results for input(s): TSH, T4TOTAL, FREET4, T3FREE, THYROIDAB in the last 72 hours. Anemia Panel: No results for  input(s): VITAMINB12, FOLATE, FERRITIN, TIBC, IRON, RETICCTPCT in the last 72 hours. Urine analysis:    Component Value Date/Time   COLORURINE YELLOW 11/18/2016 1446   APPEARANCEUR CLEAR 11/18/2016 1446   LABSPEC 1.010 11/18/2016 1446   PHURINE 6.0 11/18/2016 1446   GLUCOSEU NEGATIVE 11/18/2016 1446   HGBUR SMALL (A) 11/18/2016 1446   BILIRUBINUR NEGATIVE 11/18/2016 1446   KETONESUR 5 (A) 11/18/2016 1446   PROTEINUR 30 (A) 11/18/2016 1446   NITRITE NEGATIVE 11/18/2016 1446   LEUKOCYTESUR NEGATIVE 11/18/2016 1446   Sepsis Labs: @LABRCNTIP (procalcitonin:4,lacticidven:4)  )No results found for this or any previous visit (from the past 240 hour(s)).   Radiology Studies: Dg Chest 2 View  Result Date: 11/18/2016 CLINICAL DATA:  Shortness of breath. EXAM: CHEST  2 VIEW COMPARISON:  09/12/2016. FINDINGS: Mediastinum and hilar structures normal. Mild left base subsegmental atelectasis and/or scarring. Heart size normal. No pleural effusion or pneumothorax. Degenerative changes thoracic spine. IMPRESSION: 1. Mild left base subsegmental atelectasis and/or scarring. 2. Exam is otherwise unremarkable.  Electronically Signed   By: Maisie Fus  Register   On: 11/18/2016 11:47   Dg Lumbar Spine Complete  Result Date: 11/18/2016 CLINICAL DATA:  Injury. EXAM: LUMBAR SPINE - COMPLETE 4+ VIEW COMPARISON:  No recent prior . FINDINGS: Diffuse multilevel degenerative change. Mild scoliosis. No acute abnormality identified. Normal alignment and mineralization. Aortic atherosclerotic vascular calcification. IMPRESSION: Diffuse multilevel degenerative change. Mild scoliosis. No acute abnormality identified. Electronically Signed   By: Maisie Fus  Register   On: 11/18/2016 11:49   Dg Pelvis 1-2 Views  Result Date: 11/18/2016 CLINICAL DATA:  Larey Seat now with left low back pain EXAM: PELVIS - 1-2 VIEW COMPARISON:  None. FINDINGS: The hips are normally positioned with normal joint spaces. The pelvic rami are intact. The SI  joints are corticated. The sacral foramina are unremarkable. No acute bony abnormality is seen. IMPRESSION: Negative. Electronically Signed   By: Dwyane Dee M.D.   On: 11/18/2016 11:44   Ct Angio Chest Pe W And/or Wo Contrast  Result Date: 11/18/2016 CLINICAL DATA:  Shortness of breath, tachycardia. EXAM: CT ANGIOGRAPHY CHEST WITH CONTRAST TECHNIQUE: Multidetector CT imaging of the chest was performed using the standard protocol during bolus administration of intravenous contrast. Multiplanar CT image reconstructions and MIPs were obtained to evaluate the vascular anatomy. CONTRAST:  80 mL of Isovue 370 intravenously. COMPARISON:  Chest radiographs of same day. FINDINGS: Cardiovascular: There is no evidence of thoracic aortic dissection or aneurysm. There is no evidence of pulmonary embolus. Mediastinum/Nodes: No enlarged mediastinal, hilar, or axillary lymph nodes. Thyroid gland, trachea, and esophagus demonstrate no significant findings. Lungs/Pleura: Lungs are clear. No pleural effusion or pneumothorax. Upper Abdomen: Bilateral adrenal masses are noted, right greater than left, with Hounsfield measurements consistent with adenomas. Otherwise normal upper abdomen. Musculoskeletal: Old left lower posterior rib fracture is noted. No acute abnormality seen. Review of the MIP images confirms the above findings. IMPRESSION: No evidence of pulmonary embolus. No significant abnormality seen in the chest. Electronically Signed   By: Lupita Raider, M.D.   On: 11/18/2016 15:34    Scheduled Meds: . amLODipine  5 mg Oral Daily  . heparin  5,000 Units Subcutaneous Q8H  . hydrochlorothiazide  25 mg Oral Once  . [START ON 11/20/2016] hydrochlorothiazide  50 mg Oral Daily  . lisinopril  40 mg Oral Daily  . metoprolol succinate  25 mg Oral Daily   Continuous Infusions:   LOS: 0 days   Time spent: 25 minutes.  Hazeline Junker, MD [facility] Pager [phone]  If 7PM-7AM, please contact  night-coverage [email or web address] Password [email or web address] 11/19/2016, 10:17 AM

## 2016-11-19 NOTE — Progress Notes (Signed)
  Echocardiogram 2D Echocardiogram has been performed.  Nolon RodBrown, Tony 11/19/2016, 4:15 PM

## 2016-11-20 LAB — CBC
HCT: 48.8 % (ref 39.0–52.0)
HEMOGLOBIN: 16.5 g/dL (ref 13.0–17.0)
MCH: 30.1 pg (ref 26.0–34.0)
MCHC: 33.8 g/dL (ref 30.0–36.0)
MCV: 89.1 fL (ref 78.0–100.0)
Platelets: 221 10*3/uL (ref 150–400)
RBC: 5.48 MIL/uL (ref 4.22–5.81)
RDW: 14.6 % (ref 11.5–15.5)
WBC: 11 10*3/uL — ABNORMAL HIGH (ref 4.0–10.5)

## 2016-11-20 LAB — BASIC METABOLIC PANEL
Anion gap: 9 (ref 5–15)
BUN: 17 mg/dL (ref 6–20)
CHLORIDE: 102 mmol/L (ref 101–111)
CO2: 27 mmol/L (ref 22–32)
CREATININE: 0.95 mg/dL (ref 0.61–1.24)
Calcium: 9.5 mg/dL (ref 8.9–10.3)
GFR calc Af Amer: >60 mL/min (ref >60)
GFR calc non Af Amer: >60 mL/min (ref >60)
GLUCOSE: 93 mg/dL (ref 65–99)
Potassium: 3.8 mmol/L (ref 3.5–5.1)
SODIUM: 138 mmol/L (ref 135–145)

## 2016-11-20 LAB — PROCALCITONIN: Procalcitonin: <0.1 ng/mL

## 2016-11-20 MED ORDER — ALBUTEROL SULFATE HFA 108 (90 BASE) MCG/ACT IN AERS: 2.0000 | INHALATION_SPRAY | Freq: Four times a day (QID) | RESPIRATORY_TRACT | 0 refills | Status: DC | PRN

## 2016-11-20 MED ORDER — AMLODIPINE BESYLATE 10 MG PO TABS: 10.0000 mg | ORAL_TABLET | Freq: Every day | ORAL | 0 refills | Status: DC

## 2016-11-20 MED ORDER — METOPROLOL SUCCINATE ER 25 MG PO TB24: 25.0000 mg | ORAL_TABLET | Freq: Every day | ORAL | 0 refills | Status: DC

## 2016-11-20 MED ORDER — ALBUTEROL SULFATE HFA 108 (90 BASE) MCG/ACT IN AERS
2.0000 | INHALATION_SPRAY | Freq: Four times a day (QID) | RESPIRATORY_TRACT | Status: DC | PRN
  Administered 2016-11-20: 2 via RESPIRATORY_TRACT
  Filled 2016-11-20: qty 6.7

## 2016-11-20 MED ORDER — LISINOPRIL-HYDROCHLOROTHIAZIDE 20-25 MG PO TABS: 2.0000 | ORAL_TABLET | Freq: Every day | ORAL | 0 refills | Status: DC

## 2016-11-23 LAB — CULTURE, BLOOD (ROUTINE X 2)
CULTURE: NO GROWTH
Culture: NO GROWTH

## 2016-11-24 ENCOUNTER — Ambulatory Visit (INDEPENDENT_AMBULATORY_CARE_PROVIDER_SITE_OTHER): Payer: BLUE CROSS/BLUE SHIELD | Admitting: Family Medicine

## 2016-11-24 ENCOUNTER — Encounter: Payer: Self-pay | Admitting: Family Medicine

## 2016-11-24 VITALS — BP 155/83 | HR 94 | Temp 98.5°F | Resp 20 | Ht 74.0 in | Wt 248.5 lb

## 2016-11-24 DIAGNOSIS — F17210 Nicotine dependence, cigarettes, uncomplicated: Secondary | ICD-10-CM

## 2016-11-24 DIAGNOSIS — I10 Essential (primary) hypertension: Secondary | ICD-10-CM | POA: Diagnosis not present

## 2016-11-24 DIAGNOSIS — Z6831 Body mass index (BMI) 31.0-31.9, adult: Secondary | ICD-10-CM

## 2016-11-24 DIAGNOSIS — Z7689 Persons encountering health services in other specified circumstances: Secondary | ICD-10-CM | POA: Diagnosis not present

## 2016-11-24 MED ORDER — ADULT BLOOD PRESSURE CUFF LG KIT: PACK | 0 refills | Status: DC

## 2016-11-24 NOTE — Patient Instructions (Signed)
Continue medications, start checking BP everyday at least 2 hours after taking medicines.  Write this down and bring with you to your physical appt.  You will have fasting labs at that appt.   Schedule within 2-4 weeks   Low salt diet.     Low-Sodium Eating Plan Sodium raises blood pressure and causes water to be held in the body. Getting less sodium from food will help lower your blood pressure, reduce any swelling, and protect your heart, liver, and kidneys. We get sodium by adding salt (sodium chloride) to food. Most of our sodium comes from canned, boxed, and frozen foods. Restaurant foods, fast foods, and pizza are also very high in sodium. Even if you take medicine to lower your blood pressure or to reduce fluid in your body, getting less sodium from your food is important. What is my plan? Most people should limit their sodium intake to 2,300 mg a day. Your health care provider recommends that you limit your sodium intake to __________ a day. What do I need to know about this eating plan? For the low-sodium eating plan, you will follow these general guidelines:  Choose foods with a % Daily Value for sodium of less than 5% (as listed on the food label).  Use salt-free seasonings or herbs instead of table salt or sea salt.  Check with your health care provider or pharmacist before using salt substitutes.  Eat fresh foods.  Eat more vegetables and fruits.  Limit canned vegetables. If you do use them, rinse them well to decrease the sodium.  Limit cheese to 1 oz (28 g) per day.  Eat lower-sodium products, often labeled as "lower sodium" or "no salt added."  Avoid foods that contain monosodium glutamate (MSG). MSG is sometimes added to [place] food and some canned foods.  Check food labels (Nutrition Facts labels) on foods to learn how much sodium is in one serving.  Eat more home-cooked food and less restaurant, buffet, and fast food.  When eating at a restaurant, ask that  your food be prepared with less salt, or no salt if possible. How do I read food labels for sodium information? The Nutrition Facts label lists the amount of sodium in one serving of the food. If you eat more than one serving, you must multiply the listed amount of sodium by the number of servings. Food labels may also identify foods as:  Sodium free-Less than 5 mg in a serving.  Very low sodium-35 mg or less in a serving.  Low sodium-140 mg or less in a serving.  Light in sodium-50% less sodium in a serving. For example, if a food that usually has 300 mg of sodium is changed to become light in sodium, it will have 150 mg of sodium.  Reduced sodium-25% less sodium in a serving. For example, if a food that usually has 400 mg of sodium is changed to reduced sodium, it will have 300 mg of sodium. What foods can I eat? Grains  Low-sodium cereals, including oats, puffed wheat and rice, and shredded wheat cereals. Low-sodium crackers. Unsalted rice and pasta. Lower-sodium bread. Vegetables  Frozen or fresh vegetables. Low-sodium or reduced-sodium canned vegetables. Low-sodium or reduced-sodium tomato sauce and paste. Low-sodium or reduced-sodium tomato and vegetable juices. Fruits  Fresh, frozen, and canned fruit. Fruit juice. Meat and Other Protein Products  Low-sodium canned tuna and salmon. Fresh or frozen meat, poultry, seafood, and fish. Lamb. Unsalted nuts. Dried beans, peas, and lentils without added salt. Unsalted canned  beans. Homemade soups without salt. Eggs. Dairy  Milk. Soy milk. Ricotta cheese. Low-sodium or reduced-sodium cheeses. Yogurt. Condiments  Fresh and dried herbs and spices. Salt-free seasonings. Onion and garlic powders. Low-sodium varieties of mustard and ketchup. Fresh or refrigerated horseradish. Lemon juice. Fats and Oils  Reduced-sodium salad dressings. Unsalted butter. Other  Unsalted popcorn and pretzels. The items listed above may not be a complete list of  recommended foods or beverages. Contact your dietitian for more options.  What foods are not recommended? Grains  Instant hot cereals. Bread stuffing, pancake, and biscuit mixes. Croutons. Seasoned rice or pasta mixes. Noodle soup cups. Boxed or frozen macaroni and cheese. Self-rising flour. Regular salted crackers. Vegetables  Regular canned vegetables. Regular canned tomato sauce and paste. Regular tomato and vegetable juices. Frozen vegetables in sauces. Salted [place] fries. Olives. Rosita Fire. Relishes. Sauerkraut. Salsa. Meat and Other Protein Products  Salted, canned, smoked, spiced, or pickled meats, seafood, or fish. Bacon, ham, sausage, hot dogs, corned beef, chipped beef, and packaged luncheon meats. Salt pork. Jerky. Pickled herring. Anchovies, regular canned tuna, and sardines. Salted nuts. Dairy  Processed cheese and cheese spreads. Cheese curds. Blue cheese and cottage cheese. Buttermilk. Condiments  Onion and garlic salt, seasoned salt, table salt, and sea salt. Canned and packaged gravies. Worcestershire sauce. Tartar sauce. Barbecue sauce. Teriyaki sauce. Soy sauce, including reduced sodium. Steak sauce. Fish sauce. Oyster sauce. Cocktail sauce. Horseradish that you find on the shelf. Regular ketchup and mustard. Meat flavorings and tenderizers. Bouillon cubes. Hot sauce. Tabasco sauce. Marinades. Taco seasonings. Relishes. Fats and Oils  Regular salad dressings. Salted butter. Margarine. Ghee. Bacon fat. Other  Potato and tortilla chips. Corn chips and puffs. Salted popcorn and pretzels. Canned or dried soups. Pizza. Frozen entrees and pot pies. The items listed above may not be a complete list of foods and beverages to avoid. Contact your dietitian for more information.  This information is not intended to replace advice given to you by your health care provider. Make sure you discuss any questions you have with your health care provider. Document Released: 05/16/2002 Document  Revised: 05/01/2016 Document Reviewed: 09/28/2013 Elsevier Interactive Patient Education  2017 [employer].    Steps to Quit Smoking Smoking tobacco can be bad for your health. It can also affect almost every organ in your body. Smoking puts you and people around you at risk for many serious long-lasting (chronic) diseases. Quitting smoking is hard, but it is one of the best things that you can do for your health. It is never too late to quit. What are the benefits of quitting smoking? When you quit smoking, you lower your risk for getting serious diseases and conditions. They can include:  Lung cancer or lung disease.  Heart disease.  Stroke.  Heart attack.  Not being able to have children (infertility).  Weak bones (osteoporosis) and broken bones (fractures). If you have coughing, wheezing, and shortness of breath, those symptoms may get better when you quit. You may also get sick less often. If you are pregnant, quitting smoking can help to lower your chances of having a baby of low birth weight. What can I do to help me quit smoking? Talk with your doctor about what can help you quit smoking. Some things you can do (strategies) include:  Quitting smoking totally, instead of slowly cutting back how much you smoke over a period of time.  Going to in-person counseling. You are more likely to quit if you go to many counseling sessions.  Using resources and support systems, such as:  Online chats with a Veterinary surgeon.  Phone quitlines.  Printed Materials engineer.  Support groups or group counseling.  Text messaging programs.  Mobile phone apps or applications.  Taking medicines. Some of these medicines may have nicotine in them. If you are pregnant or breastfeeding, do not take any medicines to quit smoking unless your doctor says it is okay. Talk with your doctor about counseling or other things that can help you. Talk with your doctor about using more than one strategy at  the same time, such as taking medicines while you are also going to in-person counseling. This can help make quitting easier. What things can I do to make it easier to quit? Quitting smoking might feel very hard at first, but there is a lot that you can do to make it easier. Take these steps:  Talk to your family and friends. Ask them to support and encourage you.  Call phone quitlines, reach out to support groups, or work with a Veterinary surgeon.  Ask people who smoke to not smoke around you.  Avoid places that make you want (trigger) to smoke, such as:  Bars.  Parties.  Smoke-break areas at work.  Spend time with people who do not smoke.  Lower the stress in your life. Stress can make you want to smoke. Try these things to help your stress:  Getting regular exercise.  Deep-breathing exercises.  Yoga.  Meditating.  Doing a body scan. To do this, close your eyes, focus on one area of your body at a time from head to toe, and notice which parts of your body are tense. Try to relax the muscles in those areas.  Download or buy apps on your mobile phone or tablet that can help you stick to your quit plan. There are many free apps, such as QuitGuide from the [employer] developer for Disease Control and Prevention). You can find more support from smokefree.gov and other websites. This information is not intended to replace advice given to you by your health care provider. Make sure you discuss any questions you have with your health care provider. Document Released: 09/20/2009 Document Revised: 07/22/2016 Document Reviewed: 04/10/2015 Elsevier Interactive Patient Education  2017 Elsevier Inc.  Please help [place] help you:  It is a privilege to be able to take care of great patients such as yourself. We are honored you have chosen [facility] for your Primary Care home. Below you will find basic instructions that you may need to access in the future. Please help [place] help you by reading the  instructions, which cover many of the frequent questions we experience.   Prescription refills and request:  -In order to allow more efficient response time, please call your pharmacy for all refills. They will forward the request electronically to [place]. This allows for the quickest possible response. Request left on a nurse line can take longer to refill, since these are checked as time allows between office patients and other phone calls.  - refill request can take up to 3-5 working days to complete.  - If request is sent electronically and request is appropiate, it is usually completed in 1-2 business days.  - all patients will need to be seen routinely for all chronic medical conditions requiring prescription medications (see follow-up below). If you are overdue for follow up on your condition, you will be asked to make an appointment and we will call in enough medication to cover you until  your appointment (up to 30 days).  - all controlled substances will require a face to face visit to request/refill.  - if you desire your prescriptions to go through a new pharmacy, and have an active script at original pharmacy, you will need to call your pharmacy and have scripts transferred to new pharmacy. This is completed between the pharmacy locations and not by your provider.    Results: If any images or labs were ordered, it can take up to 1 week to get results depending on the test ordered and the lab/facility running and resulting the test. - Normal or stable results, which do not need further discussion, will be released to your mychart immediately with attached note to you. A call will not be generated for normal results. Please make certain to sign up for mychart. If you have questions on how to activate your mychart you can call the front office.  - If your results need further discussion, our office will attempt to contact you via phone, and if unable to reach you after 2 attempts, we will release  your abnormal result to your mychart with instructions.  - All results will be automatically released in mychart after 1 week.  - Your provider will provide you with explanation and instruction on all relevant material in your results. Please keep in mind, results and labs may appear confusing or abnormal to the untrained eye, but it does not mean they are actually abnormal for you personally. If you have any questions about your results that are not covered, or you desire more detailed explanation than what was provided, you should make an appointment with your provider to do so.   Our office handles many outgoing and incoming calls daily. If we have not contacted you within 1 week about your results, please check your mychart to see if there is a message first and if not, then contact our office.  In helping with this matter, you help decrease call volume, and therefore allow us to be able to respond to patients needs more efficiently.   Acute office visits (sick visit):  An acute visit is intended for a new problem and are scheduled in shorter time slots to allow schedule openings for patients with new problems. This is the appropriate visit to discuss a new problem. In order to provide you with excellent quality medical care with proper time for you to explain your problem, have an exam and receive treatment with instructions, these appointments should be limited to one new problem per visit. If you experience a new problem, in which you desire to be addressed, please make an acute office visit, we save openings on the schedule to accommodate you. Please do not save your new problem for any other type of visit, let us take care of it properly and quickly for you.   Follow up visits:  Depending on your condition(s) your provider will need to see you routinely in order to provide you with quality care and prescribe medication(s). Most chronic conditions (Example: hypertension, Diabetes,  depression/anxiety... etc), require visits a couple times a year. Your provider will instruct you on proper follow up for your personal medical conditions and history. Please make certain to make follow up appointments for your condition as instructed. Failing to do so could result in lapse in your medication treatment/refills. If you request a refill, and are overdue to be seen on a condition, we will always provide you with a 30 day script (once) to allow  you time to schedule.    Medicare wellness (well visit): - we have a wonderful Nurse Selena Batten), that will meet with you and provide you will yearly medicare wellness visits. These visits should occur yearly (can not be scheduled less than 1 calendar year apart) and cover preventive health, immunizations, advance directives and screenings you are entitled to yearly through your medicare benefits. Do not miss out on your entitled benefits, this is when medicare will pay for these benefits to be ordered for you.  These are strongly encouraged by your provider and is the appropriate type of visit to make certain you are up to date with all preventive health benefits. If you have not had your medicare wellness exam in the last 12 months, please make certain to schedule one by calling the office and schedule your medicare wellness with Selena Batten as soon as possible.   Yearly physical (well visit):  - Adults are recommended to be seen yearly for physicals. Check with your insurance and date of your last physical, most insurances require one calendar year between physicals. Physicals include all preventive health topics, screenings, medical exam and labs that are appropriate for gender/age and history. You may have fasting labs needed at this visit. This is a well visit (not a sick visit), acute topics should not be covered during this visit.  - Pediatric patients are seen more frequently when they are younger. Your provider will advise you on well child visit timing that  is appropriate for your their age. - This is not a medicare wellness visit. Medicare wellness exams do not have an exam portion to the visit. Some medicare companies allow for a physical, some do not allow a yearly physical. If your medicare allows a yearly physical you can schedule the medicare wellness with our nurse Selena Batten and have your physical with your provider after, on the same day. Please check with insurance for your full benefits.   Late Policy/No Shows:  - all new patients should arrive 15-30 minutes earlier than appointment to allow [place] time  to  obtain all personal demographics,  insurance information and for you to complete office paperwork. - All established patients should arrive 10-15 minutes earlier than appointment time to update all information and be checked in .  - In our best efforts to run on time, if you are late for your appointment you will be asked to either reschedule or if able, we will work you back into the schedule. There will be a wait time to work you back in the schedule,  depending on availability.  - If you are unable to make it to your appointment as scheduled, please call 24 hours ahead of time to allow [place] to fill the time slot with someone else who needs to be seen. If you do not cancel your appointment ahead of time, you may be charged a no show fee.

## 2016-11-24 NOTE — Progress Notes (Signed)
Patient ID: [name], male  DOB: 1961/10/12, 55 y.o.   MRN: [id number] Patient Care Team    Relationship Specialty Notifications Start End  Ma Hillock, DO PCP - General Family Medicine  11/24/16     Subjective:  [name] is a 55 y.o.  male present for new patient establishment. All past medical history, surgical history, allergies, family history, immunizations, medications and social history were updated and entered in the electronic medical record today. All recent labs, ED visits and hospitalizations within the last year were reviewed.  Hypertension: Patient presents for new patient establishment after emergency room visit with short admission for hypertension urgency. Patient was admitted on 11/18/2016 and discharged 11/20/2016, with [facility] hospitalist. His  discharge summary was available at the time of his transition of care appointment.  Patient has not had a primary care provider in multiple years. 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 He states he ran out of his medications because she would only supply medications if he was seen. Patient has not had any routine care, physicals or preventative in many years. 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 He states he does not check his blood pressure routinely, and does not have a cuff at home. He smokes daily, and has smoked for the past 45 years. He reports he does not cook with salt, but does not avoid salt and in  products premade. He does not exercise routinely. He states his diet consists mostly of steak, hamburgers, hot peppers and vegetable smoothies he makes with his bullet (machine).   Briefly patient was seen in the emergency room visit for what appeared to be back pain after slipping and falling on ice over the weekend. He was found to be tachycardic with heart rate of 131 and hypertensive. He endorses chest pain,  shortness of breath, nonproductive cough and wheezing. Patient had a chest x-ray, CT, EKG, echocardiogram. CT chest negative for pneumonia, pleural effusion, PE or pneumothorax. Mild elevated white count, without signs of UTI or pneumonia on chest x-ray. He had a mildly elevated troponin, tachycardia was felt to be tachypneic CTA ruled out PE, thoracic dissection or aneurysm. He was admitted overnight to rule out acute coronary syndrome with mildly elevated troponins. Back pain was felt to be muscle skeletal in nature. Echocardiogram with severe LVH. He was discharged on amlodipine 10 mg, lisinopril-HCTZ 20-25 2 tablets by mouth daily and metoprolol 25 mg daily.   Of note patient states that he has chronic issues of her rash of his bilateral lower extremities,  epiglottis closes off his breathing intermittently with sleeping, bilateral fingertip numbness. Discussed with patient his chronic issues will be addressed at a another appointment. Today's appointment is for establishment and his hypertension/follow-up to the emergency room visit.   There is no immunization history on file for this patient.   Past Medical History:  Diagnosis Date  . Arthritis   . History of hiatal hernia   . Hypertension   . Lung disease    pt states based on breathing tests at work  . Pneumothorax   . Tuberculosis   . Urinary incontinence    No Known Allergies Past Surgical History:  Procedure Laterality Date  . ADRENAL GLAND SURGERY     pt reports adrenal gland removed, he is uncertain which side.   Marland Kitchen CARDIAC CATHETERIZATION     pt describes a cardiac  cath procedure as being completed, states it was "clear"  . ESOPHAGOGASTRODUODENOSCOPY     pt states it was abnormal, but he does not know why  . HERNIA REPAIR    . NASAL HEMORRHAGE CONTROL N/A 02/22/2016   Procedure: EPISTAXIS CONTROL;  Surgeon: Izora Gala, MD;  Location: [facility] OR;  Service: ENT;  Laterality: N/A;   Family History  Problem Relation Age of Onset   . Arthritis Mother   . Alcohol abuse Father   . Mental illness Father   . Early death Father   . Arthritis Brother   . Heart disease Brother    Social History   Social History  . Marital status: Married    Spouse name: Burman Nieves  . Number of children: N/A  . Years of education: 52   Occupational History  . Electrical engineer    Social History Main Topics  . Smoking status: Current Every Day Smoker    Packs/day: 1.00    Years: 45.00    Types: Cigarettes  . Smokeless tobacco: Never Used  . Alcohol use 7.2 oz/week    12 Cans of beer per week  . Drug use: No  . Sexual activity: Yes    Partners: Female    Birth control/ protection: None     Comment: married   Other Topics Concern  . Not on file   Social History Narrative   married to [place].   15 years education. 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    Drinks caffeine, takes a daily vitamin.   Wears a seatbelt. Smoke detector in the home.   Wears dentures.   Feels safe in relationships.   Allergies as of 11/24/2016   No Known Allergies     Medication List       Accurate as of 11/24/16  5:35 PM. Always use your most recent med list.          Adult Blood Pressure Cuff Lg Kit 1 adult xl BP cuff kit.   albuterol 108 (90 Base) MCG/ACT inhaler Commonly known as:  PROVENTIL HFA;VENTOLIN HFA Inhale 2 puffs into the lungs every 6 (six) hours as needed for wheezing or shortness of breath.   amLODipine 10 MG tablet Commonly known as:  NORVASC Take 1 tablet (10 mg total) by mouth daily.   lisinopril-hydrochlorothiazide 20-25 MG tablet Commonly known as:  PRINZIDE,ZESTORETIC Take 2 tablets by mouth daily.   metoprolol succinate 25 MG 24 hr tablet Commonly known as:  TOPROL-XL Take 1 tablet (25 mg total) by mouth daily.        Recent Results (from the past 2160 hour(s))  I-Stat Chem 8, ED     Status: Abnormal   Collection Time: 09/12/16  2:48 PM  Result Value Ref Range   Sodium 138 135 - 145 mmol/L   Potassium 4.0  3.5 - 5.1 mmol/L   Chloride 99 (L) 101 - 111 mmol/L   BUN 13 6 - 20 mg/dL   Creatinine, Ser 0.90 0.61 - 1.24 mg/dL   Glucose, Bld 115 (H) 65 - 99 mg/dL   Calcium, Ion 1.15 1.15 - 1.40 mmol/L   TCO2 25 0 - 100 mmol/L   Hemoglobin 17.3 (H) 13.0 - 17.0 g/dL   HCT 51.0 39.0 - 37.8 %  Basic metabolic panel     Status: Abnormal   Collection Time: 11/18/16 11:38 AM  Result Value Ref Range   Sodium 137 135 - 145 mmol/L   Potassium 4.1 3.5 - 5.1 mmol/L   Chloride 98 (L) 101 -  111 mmol/L   CO2 24 22 - 32 mmol/L   Glucose, Bld 138 (H) 65 - 99 mg/dL   BUN 15 6 - 20 mg/dL   Creatinine, Ser 1.03 0.61 - 1.24 mg/dL   Calcium 9.6 8.9 - 10.3 mg/dL   GFR calc non Af Amer >60 >60 mL/min   GFR calc Af Amer >60 >60 mL/min    Comment: (NOTE) The eGFR has been calculated using the CKD EPI equation. This calculation has not been validated in all clinical situations. eGFR's persistently <60 mL/min signify possible Chronic Kidney Disease.    Anion gap 15 5 - 15  CBC with Differential     Status: Abnormal   Collection Time: 11/18/16 11:38 AM  Result Value Ref Range   WBC 21.6 (H) 4.0 - 10.5 K/uL   RBC 5.81 4.22 - 5.81 MIL/uL   Hemoglobin 17.8 (H) 13.0 - 17.0 g/dL   HCT 50.4 39.0 - 52.0 %   MCV 86.7 78.0 - 100.0 fL   MCH 30.6 26.0 - 34.0 pg   MCHC 35.3 30.0 - 36.0 g/dL   RDW 14.4 11.5 - 15.5 %   Platelets 274 150 - 400 K/uL   Neutrophils Relative % 88 %   Neutro Abs 19.0 (H) 1.7 - 7.7 K/uL   Lymphocytes Relative 5 %   Lymphs Abs 1.1 0.7 - 4.0 K/uL   Monocytes Relative 7 %   Monocytes Absolute 1.5 (H) 0.1 - 1.0 K/uL   Eosinophils Relative 0 %   Eosinophils Absolute 0.0 0.0 - 0.7 K/uL   Basophils Relative 0 %   Basophils Absolute 0.1 0.0 - 0.1 K/uL  Troponin I     Status: Abnormal   Collection Time: 11/18/16 11:38 AM  Result Value Ref Range   Troponin I 0.05 (HH) <0.03 ng/mL    Comment: CRITICAL RESULT CALLED TO, READ BACK BY AND VERIFIED WITH: HALL,C. RN _0  ON 12.12.17 BY NMCCOY     Urinalysis, Routine w reflex microscopic     Status: Abnormal   Collection Time: 11/18/16  2:46 PM  Result Value Ref Range   Color, Urine YELLOW YELLOW   APPearance CLEAR CLEAR   Specific Gravity, Urine 1.010 1.005 - 1.030   pH 6.0 5.0 - 8.0   Glucose, UA NEGATIVE NEGATIVE mg/dL   Hgb urine dipstick SMALL (A) NEGATIVE   Bilirubin Urine NEGATIVE NEGATIVE   Ketones, ur 5 (A) NEGATIVE mg/dL   Protein, ur 30 (A) NEGATIVE mg/dL   Nitrite NEGATIVE NEGATIVE   Leukocytes, UA NEGATIVE NEGATIVE   RBC / HPF 0-5 0 - 5 RBC/hpf   WBC, UA 0-5 0 - 5 WBC/hpf   Bacteria, UA RARE (A) NONE SEEN   Squamous Epithelial / LPF NONE SEEN NONE SEEN   Mucous PRESENT   Troponin I     Status: Abnormal   Collection Time: 11/18/16  3:22 PM  Result Value Ref Range   Troponin I 0.03 (HH) <0.03 ng/mL    Comment: CRITICAL VALUE NOTED.  VALUE IS CONSISTENT WITH PREVIOUSLY REPORTED AND CALLED VALUE.  Sedimentation rate     Status: None   Collection Time: 11/18/16  5:11 PM  Result Value Ref Range   Sed Rate 5 0 - 16 mm/hr  C-reactive protein     Status: None   Collection Time: 11/18/16  5:11 PM  Result Value Ref Range   CRP <0.8 <1.0 mg/dL    Comment: Performed at [facility]  Procalcitonin - Baseline     Status:  None   Collection Time: 11/18/16  5:11 PM  Result Value Ref Range   Procalcitonin <0.10 ng/mL    Comment:        Interpretation: PCT (Procalcitonin) <= 0.5 ng/mL: Systemic infection (sepsis) is not likely. Local bacterial infection is possible. (NOTE)         ICU PCT Algorithm               Non ICU PCT Algorithm    ----------------------------     ------------------------------         PCT < 0.25 ng/mL                 PCT < 0.1 ng/mL     Stopping of antibiotics            Stopping of antibiotics       strongly encouraged.               strongly encouraged.    ----------------------------     ------------------------------       PCT level decrease by               PCT < 0.25 ng/mL        >= 80% from peak PCT       OR PCT 0.25 - 0.5 ng/mL          Stopping of antibiotics                                             encouraged.     Stopping of antibiotics           encouraged.    ----------------------------     ------------------------------       PCT level decrease by              PCT >= 0.25 ng/mL       < 80% from peak PCT        AND PCT >= 0.5 ng/mL            Continuin g antibiotics                                              encouraged.       Continuing antibiotics            encouraged.    ----------------------------     ------------------------------     PCT level increase compared          PCT > 0.5 ng/mL         with peak PCT AND          PCT >= 0.5 ng/mL             Escalation of antibiotics                                          strongly encouraged.      Escalation of antibiotics        strongly encouraged.   Culture, blood (Routine X 2) w Reflex to ID Panel     Status: None   Collection Time: 11/18/16  5:13 PM  Result Value Ref Range  Specimen Description RIGHT ANTECUBITAL    Special Requests BOTTLES DRAWN AEROBIC AND ANAEROBIC 5CC    Culture      NO GROWTH 5 DAYS Performed at [facility]    Report Status 11/23/2016 FINAL   Culture, blood (Routine X 2) w Reflex to ID Panel     Status: None   Collection Time: 11/18/16  5:16 PM  Result Value Ref Range   Specimen Description BLOOD RIGHT HAND    Special Requests BOTTLES DRAWN AEROBIC AND ANAEROBIC 5CC    Culture      NO GROWTH 5 DAYS Performed at [facility]    Report Status 11/23/2016 FINAL   Troponin I-serum (0, 3, 6 hours)     Status: Abnormal   Collection Time: 11/18/16  5:51 PM  Result Value Ref Range   Troponin I 0.03 (HH) <0.03 ng/mL    Comment: CRITICAL VALUE NOTED.  VALUE IS CONSISTENT WITH PREVIOUSLY REPORTED AND CALLED VALUE.  Troponin I     Status: Abnormal   Collection Time: 11/18/16  8:57 PM  Result Value Ref Range   Troponin I 0.03 (HH) <0.03 ng/mL     Comment: CRITICAL VALUE NOTED.  VALUE IS CONSISTENT WITH PREVIOUSLY REPORTED AND CALLED VALUE.  Troponin I-serum (0, 3, 6 hours)     Status: Abnormal   Collection Time: 11/18/16 11:56 PM  Result Value Ref Range   Troponin I 0.03 (HH) <0.03 ng/mL    Comment: CRITICAL VALUE NOTED.  VALUE IS CONSISTENT WITH PREVIOUSLY REPORTED AND CALLED VALUE.  CBC     Status: Abnormal   Collection Time: 11/19/16  8:09 AM  Result Value Ref Range   WBC 11.0 (H) 4.0 - 10.5 K/uL   RBC 5.48 4.22 - 5.81 MIL/uL   Hemoglobin 16.5 13.0 - 17.0 g/dL   HCT 48.3 39.0 - 52.0 %   MCV 88.1 78.0 - 100.0 fL   MCH 30.1 26.0 - 34.0 pg   MCHC 34.2 30.0 - 36.0 g/dL   RDW 14.6 11.5 - 15.5 %   Platelets 229 150 - 400 K/uL  Basic metabolic panel     Status: None   Collection Time: 11/19/16  8:09 AM  Result Value Ref Range   Sodium 136 135 - 145 mmol/L   Potassium 3.7 3.5 - 5.1 mmol/L   Chloride 101 101 - 111 mmol/L   CO2 26 22 - 32 mmol/L   Glucose, Bld 93 65 - 99 mg/dL   BUN 13 6 - 20 mg/dL   Creatinine, Ser 0.94 0.61 - 1.24 mg/dL   Calcium 9.0 8.9 - 10.3 mg/dL   GFR calc non Af Amer >60 >60 mL/min   GFR calc Af Amer >60 >60 mL/min    Comment: (NOTE) The eGFR has been calculated using the CKD EPI equation. This calculation has not been validated in all clinical situations. eGFR's persistently <60 mL/min signify possible Chronic Kidney Disease.    Anion gap 9 5 - 15  Echocardiogram     Status: None   Collection Time: 11/19/16  4:15 PM  Result Value Ref Range   Weight 4,000.03 oz   Height 74 in   BP 146/90 mmHg  Procalcitonin     Status: None   Collection Time: 11/20/16  4:51 AM  Result Value Ref Range   Procalcitonin <0.10 ng/mL    Comment:        Interpretation: PCT (Procalcitonin) <= 0.5 ng/mL: Systemic infection (sepsis) is not likely. Local bacterial infection is possible. (NOTE)  ICU PCT Algorithm               Non ICU PCT Algorithm    ----------------------------      ------------------------------         PCT < 0.25 ng/mL                 PCT < 0.1 ng/mL     Stopping of antibiotics            Stopping of antibiotics       strongly encouraged.               strongly encouraged.    ----------------------------     ------------------------------       PCT level decrease by               PCT < 0.25 ng/mL       >= 80% from peak PCT       OR PCT 0.25 - 0.5 ng/mL          Stopping of antibiotics                                             encouraged.     Stopping of antibiotics           encouraged.    ----------------------------     ------------------------------       PCT level decrease by              PCT >= 0.25 ng/mL       < 80% from peak PCT        AND PCT >= 0.5 ng/mL            Continuin g antibiotics                                              encouraged.       Continuing antibiotics            encouraged.    ----------------------------     ------------------------------     PCT level increase compared          PCT > 0.5 ng/mL         with peak PCT AND          PCT >= 0.5 ng/mL             Escalation of antibiotics                                          strongly encouraged.      Escalation of antibiotics        strongly encouraged.   CBC     Status: Abnormal   Collection Time: 11/20/16  4:51 AM  Result Value Ref Range   WBC 11.0 (H) 4.0 - 10.5 K/uL   RBC 5.48 4.22 - 5.81 MIL/uL   Hemoglobin 16.5 13.0 - 17.0 g/dL   HCT 48.8 39.0 - 52.0 %   MCV 89.1 78.0 - 100.0 fL   MCH 30.1 26.0 - 34.0 pg   MCHC 33.8 30.0 - 36.0 g/dL   RDW 14.6 11.5 - 15.5 %  Platelets 221 150 - 400 K/uL  Basic metabolic panel     Status: None   Collection Time: 11/20/16  4:51 AM  Result Value Ref Range   Sodium 138 135 - 145 mmol/L   Potassium 3.8 3.5 - 5.1 mmol/L   Chloride 102 101 - 111 mmol/L   CO2 27 22 - 32 mmol/L   Glucose, Bld 93 65 - 99 mg/dL   BUN 17 6 - 20 mg/dL   Creatinine, Ser 0.95 0.61 - 1.24 mg/dL   Calcium 9.5 8.9 - 10.3 mg/dL   GFR calc  non Af Amer >60 >60 mL/min   GFR calc Af Amer >60 >60 mL/min    Comment: (NOTE) The eGFR has been calculated using the CKD EPI equation. This calculation has not been validated in all clinical situations. eGFR's persistently <60 mL/min signify possible Chronic Kidney Disease.    Anion gap 9 5 - 15    Dg Chest 2 View Result Date: 11/18/2016 CLINICAL DATA:  Shortness of breath. EXAM: CHEST  2 VIEW COMPARISON:  09/12/2016. FINDINGS: Mediastinum and hilar structures normal. Mild left base subsegmental atelectasis and/or scarring. Heart size normal. No pleural effusion or pneumothorax. Degenerative changes thoracic spine. IMPRESSION: 1. Mild left base subsegmental atelectasis and/or scarring. 2. Exam is otherwise unremarkable. Electronically Signed   By: [place]   On: 11/18/2016 11:47   Dg Lumbar Spine Complete Result Date: 11/18/2016 CLINICAL DATA:  Injury. EXAM: LUMBAR SPINE - COMPLETE 4+ VIEW COMPARISON:  No recent prior . FINDINGS: Diffuse multilevel degenerative change. Mild scoliosis. No acute abnormality identified. Normal alignment and mineralization. Aortic atherosclerotic vascular calcification. IMPRESSION: Diffuse multilevel degenerative change. Mild scoliosis. No acute abnormality identified. Electronically Signed   By: Marcello Moores  Register   On: 11/18/2016 11:49   Dg Pelvis 1-2 Views Result Date: 11/18/2016 CLINICAL DATA:  [place] now with left low back pain EXAM: PELVIS - 1-2 VIEW COMPARISON:  None. FINDINGS: The hips are normally positioned with normal joint spaces. The pelvic rami are intact. The SI joints are corticated. The sacral foramina are unremarkable. No acute bony abnormality is seen. IMPRESSION: Negative. Electronically Signed   By: Ivar Drape M.D.   On: 11/18/2016 11:44   Ct Angio Chest Pe W And/or Wo Contrast Result Date: 11/18/2016 CLINICAL DATA:  Shortness of breath, tachycardia. EXAM: CT ANGIOGRAPHY CHEST WITH CONTRAST TECHNIQUE: Multidetector CT imaging of the  chest was performed using the standard protocol during bolus administration of intravenous contrast. Multiplanar CT image reconstructions and MIPs were obtained to evaluate the vascular anatomy. CONTRAST:  80 mL of Isovue 370 intravenously. COMPARISON:  Chest radiographs of same day. FINDINGS: Cardiovascular: There is no evidence of thoracic aortic dissection or aneurysm. There is no evidence of pulmonary embolus. Mediastinum/Nodes: No enlarged mediastinal, hilar, or axillary lymph nodes. Thyroid gland, trachea, and esophagus demonstrate no significant findings. Lungs/Pleura: Lungs are clear. No pleural effusion or pneumothorax. Upper Abdomen: Bilateral adrenal masses are noted, right greater than left, with Hounsfield measurements consistent with adenomas. Otherwise normal upper abdomen. Musculoskeletal: Old left lower posterior rib fracture is noted. No acute abnormality seen. Review of the MIP images confirms the above findings. IMPRESSION: No evidence of pulmonary embolus. No significant abnormality seen in the chest. Electronically Signed   By: Marijo Conception, M.D.   On: 11/18/2016 15:34   Echocardiogram 11/19/2016: Study Conclusions - Left ventricle: The cavity size was normal. Wall thickness was   increased in a pattern of severe LVH. Systolic function was   normal.  The estimated ejection fraction was in the range of 60%   to 65%. Wall motion was normal; there were no regional wall   motion abnormalities. Left ventricular diastolic function   parameters were normal. - Atrial septum: No defect or patent foramen ovale was identified.  ROS: 14 pt review of systems performed and negative (unless mentioned in an HPI)  Objective: BP (!) 155/83 (BP Location: Right Arm, Patient Position: Sitting, Cuff Size: Large)   Pulse 94   Temp 98.5 F (36.9 C)   Resp 20   Ht 6' 2" (1.88 m)   Wt 248 lb 8 oz (112.7 kg)   SpO2 97%   BMI 31.91 kg/m  Gen: Afebrile. No acute distress. Nontoxic in appearance,  well-developed, well-nourished,  obese, Caucasian male. Smells of cigarette smoke. HENT: AT. [place]. MMM, no oral lesions. Chronic Cough on exam, present hoarseness on exam. Eyes:Pupils Equal Round Reactive to light, Extraocular movements intact,  Conjunctiva without redness, discharge or icterus. Neck/lymp/endocrine: Supple, no lymphadenopathy, no thyromegaly CV: RRR, no edema, +2/4 P posterior tibialis pulses. Chest: Mild diffuse rhonchi, normal work of breath. Good air movement. Abd: Soft. Obese. NTND. BS present.  Skin:  Warm and well-perfused. Skin intact. Neuro/Msk: Normal gait. PERLA. EOMi. Alert. Oriented x3.   Psych: Normal affect, dress and demeanor. Normal speech. Normal thought content and judgment.  Assessment/plan: Vencil Basnett is a 55 y.o. male present for establishment of care, with hospital admission follow-up for hypertensive urgency. All hospital admission labs and imaging studies reviewed. Essential hypertension/uncontrolled hypertension - Discussed with patient his current regimen of metoprolol 25 mg, lisinopril HCTZ 40-50 and amlodipine 10 mg today appears to be helping his blood pressure. He still mildly elevated above goal. Patient states he had just taken his blood pressure medicine prior to his appointment today, and smoked a cigarette before coming into the office. Therefore his blood pressure reading may not be as accurate as desired. Patient is to monitor his blood pressure at home, a prescription for a home blood pressure cuff monitoring system was provided. He is to record all blood pressure readings. Blood pressure routinely above 140/80 he is to be seen. Otherwise he is to schedule a complete physical in 2-4 weeks and which fasting labs blood pressure recheck will be provided. - Patient will be monitored every 3 months until blood pressure is regulated, and then can stretch out appointments to every 6 months. - Consider sleep study. Consider pulmonary function  test/pulmonology. - Blood Pressure Monitoring (ADULT BLOOD PRESSURE CUFF LG) KIT; 1 adult xl BP cuff kit.  Dispense: 1 each; Refill: 0  Heavy cigarette smoker - Briefly discussed smoking cessation. - pt follow-up in 2-4 weeks for complete physical, and if desires additional counseling it will be provided at that time.  BMI 31.0-31.9,adult - Briefly discussed diet and exercise changes needed.   Return in about 4 weeks (around 12/22/2016) for CPE.  Greater than 45 minutes was spent with patient, greater than 50% of that time was spent face-to-face with patient   Electronically signed by: Howard Pouch, [place]

## 2016-11-29 NOTE — Discharge Summary (Signed)
Physician Discharge Summary  [name] ZOX:[id number] DOB: 04-Nov-1961 DOA: 11/18/2016  PCP: Juan Pacinienee Kuneff, DO  Admit date: 11/18/2016 Discharge date: 11/29/2016  Admitted From: Home Disposition: Home   Recommendations for Outpatient Follow-up:  1. Follow up with PCP in 1-2 weeks 2. Monitor BP, discharged on amlodipine 10 mg, lisinopril-HCTZ 20-25 2 tablets by mouth daily and metoprolol 25 mg daily. 3. Please obtain BMP/CBC in one week  Home Health: None Equipment/Devices: None Discharge Condition: Stable CODE STATUS: Full Diet recommendation: Heart healthy  Brief/Interim Summary: [name] a 55 y.o.male,With past medical history of hypertension, presents secondary to complaints of lower back pain, reports he did slip on ice and fall last Saturday, as well patient reports intermittent chest pain, most recent episode this a.m. nonradiating, sharp, midsternal, no relieving or provoking factor, he reports mild dyspnea, and diaphoresis, reports cough, denies any fever or chills productive sputum or hemoptysis. - In ED, WBC 21,000, he denies any dysuria or polyuria, negative urinalysis ,CT chest with no evidence of PE or pneumonia, troponins initially 0.05, repeat 0.03, currently denies any chest pain, was tachycardic in the 120s to 130s, blood pressure uncontrolled systolic in the 190s, reported he ran out of his antihypertensive medications. Blood pressure was reduced at a controlled rate without symptoms of over correction. HCTZ was increased and norvasc was added to home medications. Echocardiogram was performed for troponin of 0.03 x4 and showed severe LVH consistent with chronically uncontrolled HTN but no abnormal wall motion.   Discharge Diagnoses:  Hypertensive urgency: Resolved. BP's improving at acceptable rate, 140's/90's today. 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 He does  not recognize the term pheochromocytoma and CT this admission shows bilateral adrenal lesions consistent with adenomas.  - Continued home medications, which he had run out of, with limited improvement. Increased HCTZ and added low dose norvasc which was titrated with near normotension. - To help with compliance, discharged on combination pill HCTZ-ACE. - Echo with severe LVH indicating chronically uncontrolled HTN.  Elevated troponin: CT chest negative for PE. Suspect demand ischemia from severe HTN. Troponins 0.03 x4.  - Echocardiogram ordered: No regional wall motion abnormalities noted.    Chest pain: Resolved spontaneously.   Leukocytosis: Improving. Suspect due to stress from fall. No further SIRS. CRP, PCT negative.  - Monitor blood cultures - Repeat CBC in AM  Lower back pain: A chronic issue, with negative acute findings on XR's but diffuse multilevel DJD.  - Continue advil prn  Discharge Instructions Discharge Instructions    Diet - low sodium heart healthy    Complete by:  As directed    Discharge instructions    Complete by:  As directed    You were admitted for very high blood pressure which has been improved with restarting and increasing your medications. You are clear for discharge with the following recommendations:  - Restart metoprolol - We have changed hydrochlorothiazide (HCTZ) by doubling the dose. For simplicity, this has been prescribed along with lisinopril in the same pill: So you will take 2 tablets of lisinopril/HCTZ 20/25mg  every day.  - Use albuterol as needed for shortness of breath or wheezing. This has been provided and will be sent to your pharmacy along with the other prescriptions above.  - It is critical that you have consistent follow up with a PCP to manage your chronic conditions.  - If your symptoms  return, please seek medical attention.   Increase activity slowly    Complete by:  As directed      Allergies as of 11/20/2016   No Known  Allergies     Medication List    STOP taking these medications   azithromycin 250 MG tablet Commonly known as:  ZITHROMAX   hydrochlorothiazide 25 MG tablet Commonly known as:  HYDRODIURIL   lisinopril 40 MG tablet Commonly known as:  PRINIVIL,ZESTRIL     TAKE these medications   albuterol 108 (90 Base) MCG/ACT inhaler Commonly known as:  PROVENTIL HFA;VENTOLIN HFA Inhale 2 puffs into the lungs every 6 (six) hours as needed for wheezing or shortness of breath.   amLODipine 10 MG tablet Commonly known as:  NORVASC Take 1 tablet (10 mg total) by mouth daily.   lisinopril-hydrochlorothiazide 20-25 MG tablet Commonly known as:  PRINZIDE,ZESTORETIC Take 2 tablets by mouth daily.   metoprolol succinate 25 MG 24 hr tablet Commonly known as:  TOPROL-XL Take 1 tablet (25 mg total) by mouth daily.      Follow-up Information    Please use the resources provided to you in emergency room by case manager to assist you're your choice of doctor for follow up. Schedule an appointment as soon as possible for a visit on 11/18/2016.   Why:  as needed        Juan Pacini, DO Follow up on 11/24/2016.   Specialty:  Family Medicine Why:  Please arrive at 1245, Appointment at 1:00 PM with Dr. Claiborne Billings.  Contact information: [address] [place] [address] [phone]          No Known Allergies  Consultations:  None  Procedures/Studies: Dg Chest 2 View  Result Date: 11/18/2016 CLINICAL DATA:  Shortness of breath. EXAM: CHEST  2 VIEW COMPARISON:  09/12/2016. FINDINGS: Mediastinum and hilar structures normal. Mild left base subsegmental atelectasis and/or scarring. Heart size normal. No pleural effusion or pneumothorax. Degenerative changes thoracic spine. IMPRESSION: 1. Mild left base subsegmental atelectasis and/or scarring. 2. Exam is otherwise unremarkable. Electronically Signed   By: Maisie Fus  Register   On: 11/18/2016 11:47   Dg Lumbar Spine Complete  Result Date:  11/18/2016 CLINICAL DATA:  Injury. EXAM: LUMBAR SPINE - COMPLETE 4+ VIEW COMPARISON:  No recent prior . FINDINGS: Diffuse multilevel degenerative change. Mild scoliosis. No acute abnormality identified. Normal alignment and mineralization. Aortic atherosclerotic vascular calcification. IMPRESSION: Diffuse multilevel degenerative change. Mild scoliosis. No acute abnormality identified. Electronically Signed   By: Maisie Fus  Register   On: 11/18/2016 11:49   Dg Pelvis 1-2 Views  Result Date: 11/18/2016 CLINICAL DATA:  [place]arey Seat now with left low back pain EXAM: PELVIS - 1-2 VIEW COMPARISON:  None. FINDINGS: The hips are normally positioned with normal joint spaces. The pelvic rami are intact. The SI joints are corticated. The sacral foramina are unremarkable. No acute bony abnormality is seen. IMPRESSION: Negative. Electronically Signed   By: Dwyane Dee M.D.   On: 11/18/2016 11:44   Ct Angio Chest Pe W And/or Wo Contrast  Result Date: 11/18/2016 CLINICAL DATA:  Shortness of breath, tachycardia. EXAM: CT ANGIOGRAPHY CHEST WITH CONTRAST TECHNIQUE: Multidetector CT imaging of the chest was performed using the standard protocol during bolus administration of intravenous contrast. Multiplanar CT image reconstructions and MIPs were obtained to evaluate the vascular anatomy. CONTRAST:  80 mL of Isovue 370 intravenously. COMPARISON:  Chest radiographs of same day. FINDINGS: Cardiovascular: There is no evidence of thoracic aortic dissection or aneurysm. There is  no evidence of pulmonary embolus. Mediastinum/Nodes: No enlarged mediastinal, hilar, or axillary lymph nodes. Thyroid gland, trachea, and esophagus demonstrate no significant findings. Lungs/Pleura: Lungs are clear. No pleural effusion or pneumothorax. Upper Abdomen: Bilateral adrenal masses are noted, right greater than left, with Hounsfield measurements consistent with adenomas. Otherwise normal upper abdomen. Musculoskeletal: Old left lower posterior rib  fracture is noted. No acute abnormality seen. Review of the MIP images confirms the above findings. IMPRESSION: No evidence of pulmonary embolus. No significant abnormality seen in the chest. Electronically Signed   By: Lupita RaiderJames  Green Jr, M.D.   On: 11/18/2016 15:34    Echocardiogram 11/19/2016: Study Conclusions - Left ventricle: The cavity size was normal. Wall thickness was increased in a pattern of severe LVH. Systolic function was normal. The estimated ejection fraction was in the range of 60% to 65%. Wall motion was normal; there were no regional wall motion abnormalities. Left ventricular diastolic function parameters were normal.  - Atrial septum: No defect or patent foramen ovale was identified.  Subjective: No overnight events. Remains without dyspnea or chest pain.   Discharge Exam: Vitals:   11/19/16 2102 11/20/16 0511  BP: (!) 151/86 (!) 157/95  Pulse: 68 76  Resp: 18 16  Temp: 98.3 F (36.8 C) 97.9 F (36.6 C)   Vitals:   11/19/16 1445 11/19/16 1646 11/19/16 2102 11/20/16 0511  BP: (!) 146/90 (!) 147/89 (!) 151/86 (!) 157/95  Pulse: 63 61 68 76  Resp: 20  18 16   Temp: 98.2 F (36.8 C)  98.3 F (36.8 C) 97.9 F (36.6 C)  TempSrc: Oral  Oral Oral  SpO2: 97%  97% 97%  Weight:      Height:       General: Pt is alert, awake, not in acute distress Cardiovascular: RRR, S1/S2 +, no rubs, no gallops Respiratory: CTA bilaterally, no wheezing, no rhonchi Abdominal: Soft, NT, ND, bowel sounds + Extremities: no edema, no cyanosis  The results of significant diagnostics from this hospitalization (including imaging, microbiology, ancillary and laboratory) are listed below for reference.    Urinalysis    Component Value Date/Time   COLORURINE YELLOW 11/18/2016 1446   APPEARANCEUR CLEAR 11/18/2016 1446   LABSPEC 1.010 11/18/2016 1446   PHURINE 6.0 11/18/2016 1446   GLUCOSEU NEGATIVE 11/18/2016 1446   HGBUR SMALL (A) 11/18/2016 1446   BILIRUBINUR NEGATIVE  11/18/2016 1446   KETONESUR 5 (A) 11/18/2016 1446   PROTEINUR 30 (A) 11/18/2016 1446   NITRITE NEGATIVE 11/18/2016 1446   LEUKOCYTESUR NEGATIVE 11/18/2016 1446   Time coordinating discharge: Over 30 minutes  Hazeline Junkeryan Fredis Malkiewicz, MD  [facility] 11/29/2016, 6:04 PM Pager [phone]  If 7PM-7AM, please contact night-coverage [email or web address] Password [email or web address]

## 2016-12-17 ENCOUNTER — Encounter: Payer: Self-pay | Admitting: Family Medicine

## 2016-12-17 ENCOUNTER — Ambulatory Visit (INDEPENDENT_AMBULATORY_CARE_PROVIDER_SITE_OTHER): Payer: BLUE CROSS/BLUE SHIELD | Admitting: Family Medicine

## 2016-12-17 VITALS — BP 148/95 | HR 72 | Temp 97.7°F | Resp 18 | Wt 255.0 lb

## 2016-12-17 DIAGNOSIS — F17210 Nicotine dependence, cigarettes, uncomplicated: Secondary | ICD-10-CM

## 2016-12-17 DIAGNOSIS — J209 Acute bronchitis, unspecified: Secondary | ICD-10-CM | POA: Diagnosis not present

## 2016-12-17 DIAGNOSIS — R062 Wheezing: Secondary | ICD-10-CM | POA: Diagnosis not present

## 2016-12-17 DIAGNOSIS — I10 Essential (primary) hypertension: Secondary | ICD-10-CM

## 2016-12-17 MED ORDER — HYDRALAZINE HCL 25 MG PO TABS: 25.0000 mg | ORAL_TABLET | Freq: Three times a day (TID) | ORAL | 0 refills | Status: DC

## 2016-12-17 MED ORDER — PREDNISONE 50 MG PO TABS: 50.0000 mg | ORAL_TABLET | Freq: Every day | ORAL | 0 refills | Status: DC

## 2016-12-17 MED ORDER — IPRATROPIUM-ALBUTEROL 0.5-2.5 (3) MG/3ML IN SOLN
3.0000 mL | Freq: Once | RESPIRATORY_TRACT | Status: AC
  Administered 2016-12-17: 3 mL via RESPIRATORY_TRACT

## 2016-12-17 MED ORDER — METHYLPREDNISOLONE ACETATE 80 MG/ML IJ SUSP
80.0000 mg | Freq: Once | INTRAMUSCULAR | Status: AC
  Administered 2016-12-17: 80 mg via INTRAMUSCULAR

## 2016-12-17 MED ORDER — DOXYCYCLINE HYCLATE 100 MG PO TABS: 100.0000 mg | ORAL_TABLET | Freq: Two times a day (BID) | ORAL | 0 refills | Status: DC

## 2016-12-17 NOTE — Patient Instructions (Addendum)
I will treat your sinus infection today with doxycyline every 12 hours for 10 days and prednisone to start tomorrow.    Continue current regimen of blood pressure medication and we add the hydralazine which has to be taken a 3 times a day. Please make sure to take medicine as directed. Monitor BP and record readings and bring them with you.     Follow up in 4 weeks for physical.

## 2016-12-17 NOTE — Progress Notes (Signed)
Pre visit review using our clinic review tool, if applicable. No additional management support is needed unless otherwise documented below in the visit note. 

## 2016-12-17 NOTE — Progress Notes (Signed)
Patient ID: [name], male  DOB: [birth year], 56 y.o.   MRN: [id number] Patient Care Team    Relationship Specialty Notifications Start End  Ma Hillock, DO PCP - General Family Medicine  11/24/16     Subjective:  [name] is a 56 y.o.  male present for follow up on hypertension  HTN: Pt presents today for follow-up on his hypertension. He reports compliance with metoprolol 25 mg daily, lisinopril-HCTZ 20-25 2 tabs daily, amlodipine 10 mg daily. He has not been monitoring his blood pressure in the outpatient setting. He still has not picked up the blood pressure cuff in which he was provided a prescription on establishment visit 2 weeks ago. He denies any chest pain, shortness of breath, dizziness or lower extremity edema. He reports that he did take his blood pressure once at [facility], and it was about where it was today in the high 140s/90s  Hypertension: Patient presents for new patient establishment after emergency room visit with short admission for hypertension urgency. Patient was admitted on 11/18/2016 and discharged 11/20/2016, with [facility]. His  discharge summary was available at the time of his transition of care appointment.  Prior note: Patient has not had a primary care provider in multiple years. 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 He states he ran out of his medications because she would only supply medications if he was seen. Patient has not had any routine care, physicals or preventative in many years. 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 He states he does not check his blood pressure routinely, and does not have a cuff at home. He smokes daily, and has smoked for the past 45 years. He reports he does not cook with salt, but does not avoid salt and in  products premade. He does not exercise routinely. He states his diet consists mostly of steak, hamburgers,  hot peppers and vegetable smoothies he makes with his bullet (machine).   Briefly patient was seen in the emergency room visit for what appeared to be back pain after slipping and falling on ice over the weekend. He was found to be tachycardic with heart rate of 131 and hypertensive. He endorses chest pain, shortness of breath, nonproductive cough and wheezing. Patient had a chest x-ray, CT, EKG, echocardiogram. CT chest negative for pneumonia, pleural effusion, PE or pneumothorax. Mild elevated white count, without signs of UTI or pneumonia on chest x-ray. He had a mildly elevated troponin, tachycardia was felt to be tachypneic CTA ruled out PE, thoracic dissection or aneurysm. He was admitted overnight to rule out acute coronary syndrome with mildly elevated troponins. Back pain was felt to be muscle skeletal in nature. Echocardiogram with severe LVH. He was discharged on amlodipine 10 mg, lisinopril-HCTZ 20-25 2 tablets by mouth daily and metoprolol 25 mg daily.  Bronchitis: Patient is more focused today on his acute illness. He states he has had increased phlegm production, coughing, chest congestion for a weeks duration since the temperature changed. He states since the temperature change he has been needing to work outside, and he feels this has made him ill. Patient is a heavy smoker. He is prescribed albuterol and states he has been using it infrequently. He is not on any controller inhalers for COPD or known history of COPD.   There is no immunization history on file for this patient.   Past Medical History:  Diagnosis Date  . Arthritis   . History of hiatal hernia   . Hypertension   . Lung disease    pt states based on breathing tests at work  . Pneumothorax   . Tuberculosis   . Urinary incontinence    No Known Allergies Past Surgical History:  Procedure Laterality Date  . ADRENAL GLAND SURGERY     pt reports adrenal gland removed, he is uncertain which side.   Marland Kitchen CARDIAC  CATHETERIZATION     pt describes a cardiac cath procedure as being completed, states it was "clear"  . ESOPHAGOGASTRODUODENOSCOPY     pt states it was abnormal, but he does not know why  . HERNIA REPAIR    . NASAL HEMORRHAGE CONTROL N/A 02/22/2016   Procedure: EPISTAXIS CONTROL;  Surgeon: Izora Gala, MD;  Location: [facility];  Service: ENT;  Laterality: N/A;   Family History  Problem Relation Age of Onset  . Arthritis Mother   . Alcohol abuse Father   . Mental illness Father   . Early death Father   . Arthritis Brother   . Heart disease Brother    Social History   Social History  . Marital status: Married    Spouse name: Burman Nieves  . Number of children: N/A  . Years of education: 38   Occupational History  . Electrical engineer    Social History Main Topics  . Smoking status: Current Every Day Smoker    Packs/day: 1.00    Years: 45.00    Types: Cigarettes  . Smokeless tobacco: Never Used  . Alcohol use 7.2 oz/week    12 Cans of beer per week  . Drug use: No  . Sexual activity: Yes    Partners: Female    Birth control/ protection: None     Comment: married   Other Topics Concern  . Not on file   Social History Narrative   married to [place].   15 years education. 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    Drinks caffeine, takes a daily vitamin.   Wears a seatbelt. Smoke detector in the home.   Wears dentures.   Feels safe in relationships.   Allergies as of 12/17/2016   No Known Allergies     Medication List       Accurate as of 12/17/16  4:25 PM. Always use your most recent med list.          Adult Blood Pressure Cuff Lg Kit 1 adult xl BP cuff kit.   albuterol 108 (90 Base) MCG/ACT inhaler Commonly known as:  PROVENTIL HFA;VENTOLIN HFA Inhale 2 puffs into the lungs every 6 (six) hours as needed for wheezing or shortness of breath.   amLODipine 10 MG tablet Commonly known as:  NORVASC Take 1 tablet (10 mg total) by mouth daily.   doxycycline 100 MG tablet Commonly  known as:  VIBRA-TABS Take 1 tablet (100 mg total) by mouth 2 (two) times daily.   hydrALAZINE 25 MG tablet Commonly known as:  APRESOLINE Take 1 tablet (25 mg total) by mouth 3 (three) times daily.   lisinopril-hydrochlorothiazide 20-25 MG tablet Commonly known as:  PRINZIDE,ZESTORETIC Take 2 tablets by mouth daily.   metoprolol succinate 25 MG 24 hr tablet Commonly known as:  TOPROL-XL Take 1 tablet (25 mg total) by mouth daily.   predniSONE 50 MG tablet Commonly known as:  DELTASONE Take 1 tablet (50 mg total) by mouth daily with breakfast.       Dg Chest 2 View Result  Date: 11/18/2016 CLINICAL DATA:  Shortness of breath. EXAM: CHEST  2 VIEW COMPARISON:  09/12/2016. FINDINGS: Mediastinum and hilar structures normal. Mild left base subsegmental atelectasis and/or scarring. Heart size normal. No pleural effusion or pneumothorax. Degenerative changes thoracic spine. IMPRESSION: 1. Mild left base subsegmental atelectasis and/or scarring. 2. Exam is otherwise unremarkable. Electronically Signed   By: Jefferson   On: 11/18/2016 11:47   Dg Lumbar Spine Complete Result Date: 11/18/2016 CLINICAL DATA:  Injury. EXAM: LUMBAR SPINE - COMPLETE 4+ VIEW COMPARISON:  No recent prior . FINDINGS: Diffuse multilevel degenerative change. Mild scoliosis. No acute abnormality identified. Normal alignment and mineralization. Aortic atherosclerotic vascular calcification. IMPRESSION: Diffuse multilevel degenerative change. Mild scoliosis. No acute abnormality identified. Electronically Signed   By: Marcello Moores  Register   On: 11/18/2016 11:49   Dg Pelvis 1-2 Views Result Date: 11/18/2016 CLINICAL DATA:  [place] now with left low back pain EXAM: PELVIS - 1-2 VIEW COMPARISON:  None. FINDINGS: The hips are normally positioned with normal joint spaces. The pelvic rami are intact. The SI joints are corticated. The sacral foramina are unremarkable. No acute bony abnormality is seen. IMPRESSION: Negative.  Electronically Signed   By: Ivar Drape M.D.   On: 11/18/2016 11:44   Ct Angio Chest Pe W And/or Wo Contrast Result Date: 11/18/2016 CLINICAL DATA:  Shortness of breath, tachycardia. EXAM: CT ANGIOGRAPHY CHEST WITH CONTRAST TECHNIQUE: Multidetector CT imaging of the chest was performed using the standard protocol during bolus administration of intravenous contrast. Multiplanar CT image reconstructions and MIPs were obtained to evaluate the vascular anatomy. CONTRAST:  80 mL of Isovue 370 intravenously. COMPARISON:  Chest radiographs of same day. FINDINGS: Cardiovascular: There is no evidence of thoracic aortic dissection or aneurysm. There is no evidence of pulmonary embolus. Mediastinum/Nodes: No enlarged mediastinal, hilar, or axillary lymph nodes. Thyroid gland, trachea, and esophagus demonstrate no significant findings. Lungs/Pleura: Lungs are clear. No pleural effusion or pneumothorax. Upper Abdomen: Bilateral adrenal masses are noted, right greater than left, with Hounsfield measurements consistent with adenomas. Otherwise normal upper abdomen. Musculoskeletal: Old left lower posterior rib fracture is noted. No acute abnormality seen. Review of the MIP images confirms the above findings. IMPRESSION: No evidence of pulmonary embolus. No significant abnormality seen in the chest. Electronically Signed   By: Marijo Conception, M.D.   On: 11/18/2016 15:34   Echocardiogram 11/19/2016: Study Conclusions - Left ventricle: The cavity size was normal. Wall thickness was   increased in a pattern of severe LVH. Systolic function was   normal. The estimated ejection fraction was in the range of 60%   to 65%. Wall motion was normal; there were no regional wall   motion abnormalities. Left ventricular diastolic function   parameters were normal. - Atrial septum: No defect or patent foramen ovale was identified.  ROS: 14 pt review of systems performed and negative (unless mentioned in an HPI)  Objective: BP  (!) 148/95 (BP Location: Left Arm, Patient Position: Sitting, Cuff Size: Large)   Pulse 72   Temp 97.7 F (36.5 C) (Oral)   Resp 18   Wt 255 lb (115.7 kg)   SpO2 96%   BMI 32.74 kg/m   Gen: Afebrile. No acute distress. On toxic in appearance, well-developed, well-nourished, obese Caucasian man. Smells of heavy cigarette smoke. HENT: AT. . Bilateral TM visualized and normal in appearance. MMM. Bilateral nares mild erythema, no swelling or drainage. Throat without erythema or exudates. No postnasal drip, cough present. Hoarseness present. Eyes:Pupils Equal Round Reactive  to light, Extraocular movements intact,  Conjunctiva without redness, discharge or icterus. Neck/lymp/endocrine: Supple, no lymphadenopathy CV: RRR Chest: Wheezing and rhonchi present diffusely. Normal respiratory effort. Diminished air movement bilaterally. Neuro:  Normal gait. PERLA. EOMi. Alert. Oriented.    Assessment/plan: [name] is a 56 y.o. male present for follow-up on hypertension with acute issue.  Essential hypertension -Patient strongly encouraged to pick up the blood pressure cuff, and monitor his blood pressure daily at least 2 hours after starting all medications. -10 units Norvasc 10 mg, metoprolol succinate 25 mg, lisinopril-HCTZ 40-50 total, and added hydralazine 25 mg 3 times a day. Discussed with patient he is on max dose of both the lisinopril, HCTZ and amlodipine.  - Low-salt diet, patient needs to exercise. Patient is to quit smoking. - Likely will need a sleep study and PFTs, went over acute illness with discussed pulmonology referral with him. - Patient to follow-up in one week if blood pressures are now within normal range, he is to follow-up in 4 weeks for physical and his blood pressure will be rechecked and if he is having normal ranges at home.  Acute bronchitis, unspecified organism/wheezing - Discussed with patient needs to quit smoking. I would like to eventually obtain pulmonary  function tests, with his heavy smoking history. Today he appears to have an acute bronchitis, will treat with doxycycline, steroid burst. Wheezing improved with DuoNeb treatment in the office today. - methylPREDNISolone acetate (DEPO-MEDROL) injection 80 mg; Inject 1 mL (80 mg total) into the muscle once. - ipratropium-albuterol (DUONEB) 0.5-2.5 (3) MG/3ML nebulizer solution 3 mL; Take 3 mLs by nebulization once. - Doxycycline 100 mg twice a day 10 days, IM Depo-Medrol injection provided today. Prednisone taper to start tomorrow for 5 days.  Return in about 4 weeks (around 01/14/2017) for CPE.   Electronically signed by: Howard Pouch, DO [place]

## 2016-12-19 ENCOUNTER — Telehealth: Payer: Self-pay | Admitting: Family Medicine

## 2016-12-19 MED ORDER — AMLODIPINE BESYLATE 10 MG PO TABS: 10.0000 mg | ORAL_TABLET | Freq: Every day | ORAL | 5 refills | Status: DC

## 2016-12-19 MED ORDER — LISINOPRIL-HYDROCHLOROTHIAZIDE 20-25 MG PO TABS: 2.0000 | ORAL_TABLET | Freq: Every day | ORAL | 5 refills | Status: DC

## 2016-12-19 NOTE — Telephone Encounter (Signed)
Patient unable to fill bp med. [facility] Please call patient back.

## 2016-12-19 NOTE — Telephone Encounter (Signed)
BP meds refilled. Patient aware.

## 2017-01-13 ENCOUNTER — Other Ambulatory Visit: Payer: Self-pay | Admitting: Family Medicine

## 2017-01-15 ENCOUNTER — Telehealth: Payer: Self-pay | Admitting: Family Medicine

## 2017-01-15 NOTE — Telephone Encounter (Signed)
Apt scheduled for 01/16/17 at 11:15am.

## 2017-01-15 NOTE — Telephone Encounter (Signed)
Please call patient back to finish conversation and forward to who you needed him to speak to. Thanks!

## 2017-01-15 NOTE — Telephone Encounter (Signed)
Hi [name],  Patient called and said that he was a [facility] patient and was speaking to Rosalita ChessmanSuzanne and Rosalita ChessmanSuzanne was going to transfer him to someone but he accidentally hung up. I advised him that Rosalita ChessmanSuzanne would call him back to finish transferring. -KE

## 2017-01-15 NOTE — Telephone Encounter (Signed)
Patient Name: [name]  DOB: February 20, 1961    Initial Comment dizziness, BP 101/70, on several meds at the moment, all say may cause dizziness.    Nurse Assessment  Nurse: Stefano GaulStringer, RN, Dwana CurdVera Date/Time (Eastern Time): 01/15/2017 2:58:26 PM  Confirm and document reason for call. If symptomatic, describe symptoms. ---caller states he has been dizzy. BP 101/70 today. Yesterday it was 90/50 and he had a cold sweats. he gets dizzy every time he gets up and moves around. He takes 5 different medications. He recently starting taking hydralazine every 8 hrs. Has had dizziness for 3 weeks. Has had cough.  Does the patient have any new or worsening symptoms? ---Yes  Will a triage be completed? ---Yes  Related visit to physician within the last 2 weeks? ---No  Does the PT have any chronic conditions? (i.e. diabetes, asthma, etc.) ---Yes  List chronic conditions. ---HTN  Is this a behavioral health or substance abuse call? ---No     Guidelines    Guideline Title Affirmed Question Affirmed Notes  Dizziness - Lightheadedness Taking a medicine that could cause dizziness (e.g., blood pressure medications, diuretics)    Final Disposition User   See Physician within 24 Hours Stringer, RN, Vera    Comments  Pt states that since he started taking the hydralazine, he has had dizziness and has had to hold onto something to keep from falling. He wants to stop the medication. Please call pt back regarding hydralazine.   Referrals  REFERRED TO PCP OFFICE   Disagree/Comply: Comply

## 2017-01-15 NOTE — Telephone Encounter (Signed)
HI UkraineKara - The LPN forwarded me this message as she is very confused. There is no note of a conversation with this patient (he was not called)  and we are unsure of what the message means. Can you assist please?  Thank you, Marchelle FolksAmanda

## 2017-01-16 ENCOUNTER — Ambulatory Visit (INDEPENDENT_AMBULATORY_CARE_PROVIDER_SITE_OTHER): Payer: BLUE CROSS/BLUE SHIELD | Admitting: Family Medicine

## 2017-01-16 ENCOUNTER — Encounter: Payer: Self-pay | Admitting: Family Medicine

## 2017-01-16 VITALS — BP 130/85 | HR 85 | Temp 98.1°F | Resp 20 | Wt 245.8 lb

## 2017-01-16 DIAGNOSIS — J418 Mixed simple and mucopurulent chronic bronchitis: Secondary | ICD-10-CM

## 2017-01-16 DIAGNOSIS — I10 Essential (primary) hypertension: Secondary | ICD-10-CM

## 2017-01-16 DIAGNOSIS — F17210 Nicotine dependence, cigarettes, uncomplicated: Secondary | ICD-10-CM

## 2017-01-16 MED ORDER — BUDESONIDE-FORMOTEROL FUMARATE 160-4.5 MCG/ACT IN AERO: 2.0000 | INHALATION_SPRAY | Freq: Two times a day (BID) | RESPIRATORY_TRACT | 3 refills | Status: DC

## 2017-01-16 MED ORDER — HYDRALAZINE HCL 10 MG PO TABS: 10.0000 mg | ORAL_TABLET | Freq: Three times a day (TID) | ORAL | 2 refills | Status: DC

## 2017-01-16 NOTE — Patient Instructions (Signed)
I will call in an inhaler for you to start daily and refer you to Pulmonology for COPD and likely sleep study.   Stop 25 mg Hydralazine and start the 10 mg dosage called in to pharmacy.  Monitor BP and < 120/60 bottom then call in ot if dizziness continues.   Start drinking more water. If dizzy with standing it can be caused by dryness and you are on a diuretic.

## 2017-01-16 NOTE — Telephone Encounter (Signed)
noted 

## 2017-01-16 NOTE — Progress Notes (Signed)
   Patient ID: [name], male  DOB: 06/30/1961, 55 y.o.   MRN: [id number] Patient Care Team    Relationship Specialty Notifications Start End  Renee A Kuneff, DO PCP - General Family Medicine  11/24/16     Subjective:  [name] is a 55 y.o.  male present for follow up on hypertension  HTN:  Pt reports compliance with amlodipine 10, metoprolol 25, LIS/HCTZ 40/50 and Hydralazine 25 mg TID. Since his last visit he has been noticing increase in dizziness and lower end BP. He reports dizziness is with standing mostly. He also dizziness with cough. He became dizzy at work and his BP was taken it was 90/50.  Blood pressures ranges at home 113/71. Patient denies chest pain, new shortness of breath or lower extremity edema. Pt does not take a daily baby ASA. Pt is not prescribed statin (he is new and not had physical yet-scheduled). BMP: 11/2016, normal CBC: 11/2016, WNL  Diet: eating low sugar, low salt.  Exercise: no RF: COPD/heavysmoker.   COPD: Heavy smoker > 45 years. He reports having a lung study > 10 years ago in [place] that said he had the lung health of a "56 year old" and stopping smoking would not improve that much. He was started on an inhaler (which sounds like spiriva by description). He states it was rather expensive for him to use even with insurance, he did not give other reason why he quit using. He endorses shortness of breath with lifting and activity, with NO chest pain. He reports weakness in his legs with activity. Increase phlegm production.    Prior note 12/2016:  Patient presents for new patient establishment after emergency room visit with short admission for hypertension urgency. Patient was admitted on 11/18/2016 and discharged 11/20/2016, with [facility] hospitalist. His  discharge summary was available at the time of his transition of care appointment.  Prior note: Patient has not had a primary care provider in multiple years. He states he has been following up with  the nurse practitioner at his employment office. He states he ran out of his medications because she would only supply medications if he was seen. Patient has not had any routine care, physicals or preventative in many years. 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 He states he does not check his blood pressure routinely, and does not have a cuff at home. He smokes daily, and has smoked for the past 45 years. He reports he does not cook with salt, but does not avoid salt and in  products premade. He does not exercise routinely. He states his diet consists mostly of steak, hamburgers, hot peppers and vegetable smoothies he makes with his bullet (machine).   Briefly patient was seen in the emergency room visit for what appeared to be back pain after slipping and falling on ice over the weekend. He was found to be tachycardic with heart rate of 131 and hypertensive. He endorses chest pain, shortness of breath, nonproductive cough and wheezing. Patient had a chest x-ray, CT, EKG, echocardiogram. CT chest negative for pneumonia, pleural effusion, PE or pneumothorax. Mild elevated white count, without signs of UTI or pneumonia on chest x-ray. He had a mildly elevated troponin, tachycardia was felt to be tachypneic CTA ruled out PE, thoracic dissection or aneurysm. He was admitted overnight to rule out acute coronary syndrome with mildly elevated troponins. Back pain was felt to be muscle skeletal in nature. Echocardiogram   with severe LVH. He was discharged on amlodipine 10 mg, lisinopril-HCTZ 20-25 2 tablets by mouth daily and metoprolol 25 mg daily.   There is no immunization history on file for this patient.   Past Medical History:  Diagnosis Date  . Arthritis   . History of hiatal hernia   . Hypertension   . Lung disease    pt states based on breathing tests at work  . Pneumothorax   . Tuberculosis   . Urinary incontinence    No Known  Allergies Past Surgical History:  Procedure Laterality Date  . ADRENAL GLAND SURGERY     pt reports adrenal gland removed, he is uncertain which side.   . CARDIAC CATHETERIZATION     pt describes a cardiac cath procedure as being completed, states it was "clear"  . ESOPHAGOGASTRODUODENOSCOPY     pt states it was abnormal, but he does not know why  . HERNIA REPAIR    . NASAL HEMORRHAGE CONTROL N/A 02/22/2016   Procedure: EPISTAXIS CONTROL;  Surgeon: Jefry Rosen, MD;  Location: [facility] OR;  Service: ENT;  Laterality: N/A;   Family History  Problem Relation Age of Onset  . Arthritis Mother   . Alcohol abuse Father   . Mental illness Father   . Early death Father   . Arthritis Brother   . Heart disease Brother    Social History   Social History  . Marital status: Married    Spouse name: [name]  . Number of children: N/A  . Years of education: 15   Occupational History  . Aircraft Mechanic    Social History Main Topics  . Smoking status: Current Every Day Smoker    Packs/day: 1.00    Years: 45.00    Types: Cigarettes  . Smokeless tobacco: Never Used  . Alcohol use 7.2 oz/week    12 Cans of beer per week  . Drug use: No  . Sexual activity: Yes    Partners: Female    Birth control/ protection: None     Comment: married   Other Topics Concern  . Not on file   Social History Narrative   married to [name].   15 years education. 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    Drinks caffeine, takes a daily vitamin.   Wears a seatbelt. Smoke detector in the home.   Wears dentures.   Feels safe in relationships.   Allergies as of 01/16/2017   No Known Allergies     Medication List       Accurate as of 01/16/17 11:35 AM. Always use your most recent med list.          Adult Blood Pressure Cuff Lg Kit 1 adult xl BP cuff kit.   albuterol 108 (90 Base) MCG/ACT inhaler Commonly known as:  PROVENTIL HFA;VENTOLIN HFA Inhale 2 puffs into the lungs every 6 (six) hours as needed for wheezing  or shortness of breath.   amLODipine 10 MG tablet Commonly known as:  NORVASC Take 1 tablet (10 mg total) by mouth daily.   hydrALAZINE 25 MG tablet Commonly known as:  APRESOLINE TAKE 1 TABLET (25 MG TOTAL) BY MOUTH 3 (THREE) TIMES DAILY.   lisinopril-hydrochlorothiazide 20-25 MG tablet Commonly known as:  PRINZIDE,ZESTORETIC Take 2 tablets by mouth daily.   metoprolol succinate 25 MG 24 hr tablet Commonly known as:  TOPROL-XL Take 1 tablet (25 mg total) by mouth daily.       Dg Chest 2 View Result Date: 11/18/2016 CLINICAL DATA:  Shortness   of breath. EXAM: CHEST  2 VIEW COMPARISON:  09/12/2016. FINDINGS: Mediastinum and hilar structures normal. Mild left base subsegmental atelectasis and/or scarring. Heart size normal. No pleural effusion or pneumothorax. Degenerative changes thoracic spine. IMPRESSION: 1. Mild left base subsegmental atelectasis and/or scarring. 2. Exam is otherwise unremarkable. Electronically Signed   By: Thomas  Register   On: 11/18/2016 11:47   Dg Lumbar Spine Complete Result Date: 11/18/2016 CLINICAL DATA:  Injury. EXAM: LUMBAR SPINE - COMPLETE 4+ VIEW COMPARISON:  No recent prior . FINDINGS: Diffuse multilevel degenerative change. Mild scoliosis. No acute abnormality identified. Normal alignment and mineralization. Aortic atherosclerotic vascular calcification. IMPRESSION: Diffuse multilevel degenerative change. Mild scoliosis. No acute abnormality identified. Electronically Signed   By: Thomas  Register   On: 11/18/2016 11:49   Dg Pelvis 1-2 Views Result Date: 11/18/2016 CLINICAL DATA:  Fell now with left low back pain EXAM: PELVIS - 1-2 VIEW COMPARISON:  None. FINDINGS: The hips are normally positioned with normal joint spaces. The pelvic rami are intact. The SI joints are corticated. The sacral foramina are unremarkable. No acute bony abnormality is seen. IMPRESSION: Negative. Electronically Signed   By: Paul  Barry M.D.   On: 11/18/2016 11:44   Ct Angio  Chest Pe W And/or Wo Contrast Result Date: 11/18/2016 CLINICAL DATA:  Shortness of breath, tachycardia. EXAM: CT ANGIOGRAPHY CHEST WITH CONTRAST TECHNIQUE: Multidetector CT imaging of the chest was performed using the standard protocol during bolus administration of intravenous contrast. Multiplanar CT image reconstructions and MIPs were obtained to evaluate the vascular anatomy. CONTRAST:  80 mL of Isovue 370 intravenously. COMPARISON:  Chest radiographs of same day. FINDINGS: Cardiovascular: There is no evidence of thoracic aortic dissection or aneurysm. There is no evidence of pulmonary embolus. Mediastinum/Nodes: No enlarged mediastinal, hilar, or axillary lymph nodes. Thyroid gland, trachea, and esophagus demonstrate no significant findings. Lungs/Pleura: Lungs are clear. No pleural effusion or pneumothorax. Upper Abdomen: Bilateral adrenal masses are noted, right greater than left, with Hounsfield measurements consistent with adenomas. Otherwise normal upper abdomen. Musculoskeletal: Old left lower posterior rib fracture is noted. No acute abnormality seen. Review of the MIP images confirms the above findings. IMPRESSION: No evidence of pulmonary embolus. No significant abnormality seen in the chest. Electronically Signed   By: James  Green Jr, M.D.   On: 11/18/2016 15:34   Echocardiogram 11/19/2016: Study Conclusions - Left ventricle: The cavity size was normal. Wall thickness was   increased in a pattern of severe LVH. Systolic function was   normal. The estimated ejection fraction was in the range of 60%   to 65%. Wall motion was normal; there were no regional wall   motion abnormalities. Left ventricular diastolic function   parameters were normal. - Atrial septum: No defect or patent foramen ovale was identified.  ROS: 14 pt review of systems performed and negative (unless mentioned in an HPI)  Objective: BP 130/85 (BP Location: Left Arm, Patient Position: Sitting, Cuff Size: Large)    Pulse 85   Temp 98.1 F (36.7 C)   Resp 20   Wt 245 lb 12 oz (111.5 kg)   SpO2 95%   BMI 31.55 kg/m   Gen: Afebrile. No acute distress. Cough present HENT: AT. [name]. MMM.  Eyes:Pupils Equal Round Reactive to light, Extraocular movements intact,  Conjunctiva without redness, discharge or icterus. Neck/lymp/endocrine: Supple,no lymphadenopathy CV: RRR, no edema Chest: CTAB, no wheeze or crackles. Diminished breath sounds bilateral.   Neuro: Normal gait. PERLA. EOMi. Alert. Oriented.   Assessment/plan: Keifer Drouillard is   a 55 y.o. male present for follow-up on hypertension  Essential hypertension - pt encouraged to drink WATER daily. He is currently drinking ice tea (1 gallon) only and he is on HCTZ 50 mg now. His symptoms of dizziness is mostly with standing, could be orthostatic causes contributing.  - Continue Norvasc 10 mg QD, metoprolol 25 mg QD, Lis/HCTZ 40/50 - decrease hydralazine to 10 mg TID.  - continue to monitor pressures after above changes and if routinely below 120/60 or dizziness continues then would favor to see cardiologist.   Heavy cigarette smoker/Mixed simple and mucopurulent chronic bronchitis (HCC) - encourage smoking cessation.  - Symbicort 2 puffs BID, started today.  - Albuterol for wheezing or shortness of breath.  - Ambulatory referral to Pulmonology will need formal eval and possibly sleep study as well.   He has appt next week for CPE.  Electronically signed by: Renee Kuneff, DO [facility]  

## 2017-01-20 ENCOUNTER — Telehealth: Payer: Self-pay | Admitting: Family Medicine

## 2017-01-20 NOTE — Telephone Encounter (Signed)
[facility] called and [facility] if he can stay out of work until Friday when he sees [place]. Patient is still getting dizzy last BP checks this morning was 121/80 then 159/96 and 165/10...Marland Kitchen.Marland Kitchen.After patient told me last BP I transferred him to [facility] immediately. Please be on stand by for a response from [facility].   Thank you

## 2017-01-20 NOTE — Telephone Encounter (Signed)
Call returned to patient, he was instructed to go to ER for symptoms were bad enough and BP stayed elevated or to come in to be seen.  Patient scheduled an appointment by Misty StanleyLisa to be seen sooner than previous scheduled appointment on Fri. 01/23/17.

## 2017-01-20 NOTE — Telephone Encounter (Signed)
[facility] - Client TELEPHONE ADVICE RECORD [facility] Patient Name: [name] DOB: 14-Sep-1961 Initial Comment Caller states that he is having intermittent blood pressure problems; his BP= 155/101, and he is having dizziness Nurse Assessment Nurse: [name], RN, [name] Date/Time (Eastern Time): 01/20/2017 2:31:11 PM Confirm and document reason for call. If symptomatic, describe symptoms. ---Caller states he has been dizzy since starting hydralazine, saw dr and she decreased it to hydralazine 10mg  tid, current bp 147/85. Caller states sometimes he has the shakes. Does the patient have any new or worsening symptoms? ---No Guidelines Guideline Title Affirmed Question Affirmed Notes Final Disposition User Clinical Call [name], RN, [name] Comments Caller has appt Friday but this is his 3 rd day of missing work and he needs someone to say it's ok to keep missing, since he works on [employer] they won't allow him to work while having dizziness. Caller states he is not worse than when he saw the doctor. RN warm [name].

## 2017-01-20 NOTE — Telephone Encounter (Signed)
error 

## 2017-01-20 NOTE — Telephone Encounter (Signed)
Please call patient to discuss BP and clarify next steps for either another follow up appointment or medication needs for dizziness. He is still dizzy and can not return to work until that subsides. Please call as soon as possible.  Thank you

## 2017-01-21 ENCOUNTER — Ambulatory Visit: Payer: BLUE CROSS/BLUE SHIELD | Admitting: Family Medicine

## 2017-01-21 NOTE — Telephone Encounter (Signed)
error 

## 2017-01-22 ENCOUNTER — Ambulatory Visit (INDEPENDENT_AMBULATORY_CARE_PROVIDER_SITE_OTHER): Payer: BLUE CROSS/BLUE SHIELD | Admitting: Family Medicine

## 2017-01-22 ENCOUNTER — Encounter: Payer: Self-pay | Admitting: Family Medicine

## 2017-01-22 VITALS — BP 151/90 | HR 84 | Temp 98.3°F | Resp 20 | Wt 251.2 lb

## 2017-01-22 DIAGNOSIS — I10 Essential (primary) hypertension: Secondary | ICD-10-CM | POA: Diagnosis not present

## 2017-01-22 DIAGNOSIS — R42 Dizziness and giddiness: Secondary | ICD-10-CM | POA: Diagnosis not present

## 2017-01-22 MED ORDER — METOPROLOL SUCCINATE ER 25 MG PO TB24: 50.0000 mg | ORAL_TABLET | Freq: Every day | ORAL | 2 refills | Status: DC

## 2017-01-22 NOTE — Progress Notes (Signed)
Patient ID: [name], male  DOB: 1961-08-03, 56 y.o.   MRN: [id number] Patient Care Team    Relationship Specialty Notifications Start End  Ma Hillock, DO PCP - General Family Medicine  11/24/16     Subjective:  [name] is a 56 y.o.  male present for follow up dizziness.   Patient presents in the office again today for reported continued dizziness. He was seen last week (see note below). He was felt he was having low blood pressures causing dizziness, although uncertain given blood pressure was normal in the office. Patient was given a note to return to work last Sunday. Patient did not return to work last Sunday and states he must have misunderstood because he was still having dizziness he thought he should not go. He reports one event where he became dizzy when he was in the shower, and the dizziness lasted for about an hour. He then felt dizzy about 10 times that day. He had continued to feel dizzy every day since Sunday until today. He states he noticed he became dizzy when he was sitting in chair, when he would cough and when he was taking a shower. He describes his "dizziness "as lightheaded and vision going blank per second. He also endorses room spinning at times. He states he did take his blood pressures at home: 140/85, 135/81, 123/79, 151/80. He has started the Pulmicort inhaler, and has his referral to pulmonology on March 6. She denies chest pain, shortness of breath, lower extremity edema or syncope.   Prior note 12/2016:  Patient presents for new patient establishment after emergency room visit with short admission for hypertension urgency. Patient was admitted on 11/18/2016 and discharged 11/20/2016, with [facility] hospitalist. His  discharge summary was available at the time of his transition of care appointment.  Prior note: Patient has not had a primary care provider in multiple years. He states he has been following up with the nurse practitioner at his employment  office. He states he ran out of his medications because she would only supply medications if he was seen. Patient has not had any routine care, physicals or preventative in many years. 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 He states he does not check his blood pressure routinely, and does not have a cuff at home. He smokes daily, and has smoked for the past 45 years. He reports he does not cook with salt, but does not avoid salt and in  products premade. He does not exercise routinely. He states his diet consists mostly of steak, hamburgers, hot peppers and vegetable smoothies he makes with his bullet (machine).   Briefly patient was seen in the emergency room visit for what appeared to be back pain after slipping and falling on ice over the weekend. He was found to be tachycardic with heart rate of 131 and hypertensive. He endorses chest pain, shortness of breath, nonproductive cough and wheezing. Patient had a chest x-ray, CT, EKG, echocardiogram. CT chest negative for pneumonia, pleural effusion, PE or pneumothorax. Mild elevated white count, without signs of UTI or pneumonia on chest x-ray. He had a mildly elevated troponin, tachycardia was felt to be tachypneic CTA ruled out PE, thoracic dissection or aneurysm. He was admitted overnight to rule out acute coronary syndrome with mildly elevated troponins. Back pain was felt to be muscle skeletal in nature. Echocardiogram with severe LVH. He was discharged on amlodipine 10 mg, lisinopril-HCTZ  20-25 2 tablets by mouth daily and metoprolol 25 mg daily.   There is no immunization history on file for this patient.   Past Medical History:  Diagnosis Date  . Arthritis   . History of hiatal hernia   . Hypertension   . Lung disease    pt states based on breathing tests at work  . Pneumothorax   . Tuberculosis   . Urinary incontinence    No Known Allergies Past Surgical History:  Procedure  Laterality Date  . ADRENAL GLAND SURGERY     pt reports adrenal gland removed, he is uncertain which side.   Marland Kitchen CARDIAC CATHETERIZATION     pt describes a cardiac cath procedure as being completed, states it was "clear"  . ESOPHAGOGASTRODUODENOSCOPY     pt states it was abnormal, but he does not know why  . HERNIA REPAIR    . NASAL HEMORRHAGE CONTROL N/A 02/22/2016   Procedure: EPISTAXIS CONTROL;  Surgeon: Izora Gala, MD;  Location: [facility] OR;  Service: ENT;  Laterality: N/A;   Family History  Problem Relation Age of Onset  . Arthritis Mother   . Alcohol abuse Father   . Mental illness Father   . Early death Father   . Arthritis Brother   . Heart disease Brother    Social History   Social History  . Marital status: Married    Spouse name: [name]  . Number of children: N/A  . Years of education: 38   Occupational History  . Electrical engineer    Social History Main Topics  . Smoking status: Current Every Day Smoker    Packs/day: 1.00    Years: 45.00    Types: Cigarettes  . Smokeless tobacco: Never Used  . Alcohol use 7.2 oz/week    12 Cans of beer per week  . Drug use: No  . Sexual activity: Yes    Partners: Female    Birth control/ protection: None     Comment: married   Other Topics Concern  . Not on file   Social History Narrative   married to [name].   15 years education. 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    Drinks caffeine, takes a daily vitamin.   Wears a seatbelt. Smoke detector in the home.   Wears dentures.   Feels safe in relationships.   Allergies as of 01/22/2017   No Known Allergies     Medication List       Accurate as of 01/22/17 11:08 AM. Always use your most recent med list.          Adult Blood Pressure Cuff Lg Kit 1 adult xl BP cuff kit.   albuterol 108 (90 Base) MCG/ACT inhaler Commonly known as:  PROVENTIL HFA;VENTOLIN HFA Inhale 2 puffs into the lungs every 6 (six) hours as needed for wheezing or shortness of breath.   amLODipine 10 MG  tablet Commonly known as:  NORVASC Take 1 tablet (10 mg total) by mouth daily.   budesonide-formoterol 160-4.5 MCG/ACT inhaler Commonly known as:  SYMBICORT Inhale 2 puffs into the lungs 2 (two) times daily.   hydrALAZINE 10 MG tablet Commonly known as:  APRESOLINE Take 1 tablet (10 mg total) by mouth 3 (three) times daily.   lisinopril-hydrochlorothiazide 20-25 MG tablet Commonly known as:  PRINZIDE,ZESTORETIC Take 2 tablets by mouth daily.   metoprolol succinate 25 MG 24 hr tablet Commonly known as:  TOPROL-XL Take 1 tablet (25 mg total) by mouth daily.       Dg  Chest 2 View Result Date: 11/18/2016 CLINICAL DATA:  Shortness of breath. EXAM: CHEST  2 VIEW COMPARISON:  09/12/2016. FINDINGS: Mediastinum and hilar structures normal. Mild left base subsegmental atelectasis and/or scarring. Heart size normal. No pleural effusion or pneumothorax. Degenerative changes thoracic spine. IMPRESSION: 1. Mild left base subsegmental atelectasis and/or scarring. 2. Exam is otherwise unremarkable. Electronically Signed   By: Zillah   On: 11/18/2016 11:47   Dg Lumbar Spine Complete Result Date: 11/18/2016 CLINICAL DATA:  Injury. EXAM: LUMBAR SPINE - COMPLETE 4+ VIEW COMPARISON:  No recent prior . FINDINGS: Diffuse multilevel degenerative change. Mild scoliosis. No acute abnormality identified. Normal alignment and mineralization. Aortic atherosclerotic vascular calcification. IMPRESSION: Diffuse multilevel degenerative change. Mild scoliosis. No acute abnormality identified. Electronically Signed   By: Marcello Moores  Register   On: 11/18/2016 11:49   Dg Pelvis 1-2 Views Result Date: 11/18/2016 CLINICAL DATA:  [place] now with left low back pain EXAM: PELVIS - 1-2 VIEW COMPARISON:  None. FINDINGS: The hips are normally positioned with normal joint spaces. The pelvic rami are intact. The SI joints are corticated. The sacral foramina are unremarkable. No acute bony abnormality is seen. IMPRESSION:  Negative. Electronically Signed   By: Ivar Drape M.D.   On: 11/18/2016 11:44   Ct Angio Chest Pe W And/or Wo Contrast Result Date: 11/18/2016 CLINICAL DATA:  Shortness of breath, tachycardia. EXAM: CT ANGIOGRAPHY CHEST WITH CONTRAST TECHNIQUE: Multidetector CT imaging of the chest was performed using the standard protocol during bolus administration of intravenous contrast. Multiplanar CT image reconstructions and MIPs were obtained to evaluate the vascular anatomy. CONTRAST:  80 mL of Isovue 370 intravenously. COMPARISON:  Chest radiographs of same day. FINDINGS: Cardiovascular: There is no evidence of thoracic aortic dissection or aneurysm. There is no evidence of pulmonary embolus. Mediastinum/Nodes: No enlarged mediastinal, hilar, or axillary lymph nodes. Thyroid gland, trachea, and esophagus demonstrate no significant findings. Lungs/Pleura: Lungs are clear. No pleural effusion or pneumothorax. Upper Abdomen: Bilateral adrenal masses are noted, right greater than left, with Hounsfield measurements consistent with adenomas. Otherwise normal upper abdomen. Musculoskeletal: Old left lower posterior rib fracture is noted. No acute abnormality seen. Review of the MIP images confirms the above findings. IMPRESSION: No evidence of pulmonary embolus. No significant abnormality seen in the chest. Electronically Signed   By: Marijo Conception, M.D.   On: 11/18/2016 15:34   Echocardiogram 11/19/2016: Study Conclusions - Left ventricle: The cavity size was normal. Wall thickness was   increased in a pattern of severe LVH. Systolic function was   normal. The estimated ejection fraction was in the range of 60%   to 65%. Wall motion was normal; there were no regional wall   motion abnormalities. Left ventricular diastolic function   parameters were normal. - Atrial septum: No defect or patent foramen ovale was identified.  ROS: 14 pt review of systems performed and negative (unless mentioned in an  HPI)  Objective: BP (!) 151/90 (BP Location: Right Arm, Cuff Size: Large)   Pulse 84   Temp 98.3 F (36.8 C)   Resp 20   Wt 251 lb 4 oz (114 kg)   SpO2 97%   BMI 32.26 kg/m  Gen: Afebrile. No acute distress.  HENT: AT. [place].  MMM.  Eyes:Pupils Equal Round Reactive to light, Extraocular movements intact,  Conjunctiva without redness, discharge or icterus. Neck/lymp/endocrine: Supple,no lymphadenopathy CV: RRR, no edema, +2/4 P posterior tibialis pulses Chest: CTAB, no wheeze or crackles Abd: Soft. NTND. BS present.  Neuro:  Normal gait. PERLA. EOMi. Alert. Oriented.    Assessment/plan: Damaso Laday is a 56 y.o. male present for follow-up on hypertension  Essential hypertension/dizziness - BP 141/88 with recheck. Orthostatics negative. Pt reports he is now not dizzy. - discussed carotid doppler studies and is agreeable to testing , ordered.  - pt encouraged to drink WATER daily, which he states he has now been doing.  - Continue Norvasc 10 mg QD, increase metoprolol to 50 mg QD, Lis/HCTZ 40/50 - Continue hydralazine 10 mg TID.  - continue to monitor pressures ideally should be in the range of 120-139/60-80 - referral to cardiology for continued dizziness. Pt had a work up per his rpeort "many years ago" with what sounds like a normal lexiscan. He seems to be unaware of the smoking/BP/cholesterol etc as potential causing a cardiac condition now or in the future despite his "prior normal" lexiscan many years ago.  - agreed to extend work excuse this time. Educated if he needs a work excuse then he needs to be seen in the future, will not provide excuse without him being seen for condition.  - If doing well F/U 3 months, sooner if needed.   Electronically signed by: Howard Pouch, DO [place]

## 2017-01-22 NOTE — Patient Instructions (Addendum)
Referral to cardiology to make recommendations for increased dizziness/symtpoms and hypertension.  Placed order for carotid studies. They will call you to schedule at [facility].  Increase Metoprolol to 50 mg for better BP control. Continue to monitor BP if > 140/90 routinely please report it to us/cardiology depending if you are already seeing them.    I will write an excuse for your missed days, however if you continue to have dizziness you need to be seen urgently in the ED.

## 2017-01-23 ENCOUNTER — Encounter: Payer: BLUE CROSS/BLUE SHIELD | Admitting: Family Medicine

## 2017-01-28 ENCOUNTER — Ambulatory Visit: Payer: BLUE CROSS/BLUE SHIELD | Admitting: Cardiology

## 2017-01-29 ENCOUNTER — Ambulatory Visit (HOSPITAL_BASED_OUTPATIENT_CLINIC_OR_DEPARTMENT_OTHER): Payer: BLUE CROSS/BLUE SHIELD

## 2017-02-10 ENCOUNTER — Institutional Professional Consult (permissible substitution): Payer: BLUE CROSS/BLUE SHIELD | Admitting: Pulmonary Disease

## 2017-04-06 ENCOUNTER — Emergency Department (HOSPITAL_COMMUNITY)
Admission: EM | Admit: 2017-04-06 | Discharge: 2017-04-06 | Disposition: A | Discharge status: Home / Self Care | Payer: BLUE CROSS/BLUE SHIELD | Attending: Emergency Medicine | Admitting: Emergency Medicine

## 2017-04-06 ENCOUNTER — Encounter (HOSPITAL_COMMUNITY): Payer: Self-pay | Admitting: Emergency Medicine

## 2017-04-06 ENCOUNTER — Emergency Department (HOSPITAL_COMMUNITY): Payer: BLUE CROSS/BLUE SHIELD

## 2017-04-06 DIAGNOSIS — R1031 Right lower quadrant pain: Secondary | ICD-10-CM | POA: Diagnosis not present

## 2017-04-06 DIAGNOSIS — R140 Abdominal distension (gaseous): Secondary | ICD-10-CM | POA: Diagnosis not present

## 2017-04-06 DIAGNOSIS — R109 Unspecified abdominal pain: Secondary | ICD-10-CM | POA: Diagnosis present

## 2017-04-06 DIAGNOSIS — F17210 Nicotine dependence, cigarettes, uncomplicated: Secondary | ICD-10-CM | POA: Insufficient documentation

## 2017-04-06 DIAGNOSIS — I10 Essential (primary) hypertension: Secondary | ICD-10-CM | POA: Insufficient documentation

## 2017-04-06 LAB — URINALYSIS, ROUTINE W REFLEX MICROSCOPIC
Bilirubin Urine: NEGATIVE
Glucose, UA: NEGATIVE mg/dL
HGB URINE DIPSTICK: NEGATIVE
KETONES UR: NEGATIVE mg/dL
Leukocytes, UA: NEGATIVE
Nitrite: NEGATIVE
PROTEIN: NEGATIVE mg/dL
Specific Gravity, Urine: 1.013 (ref 1.005–1.030)
pH: 5 (ref 5.0–8.0)

## 2017-04-06 LAB — POC OCCULT BLOOD, ED: Fecal Occult Bld: NEGATIVE

## 2017-04-06 LAB — COMPREHENSIVE METABOLIC PANEL
ALK PHOS: 59 U/L (ref 38–126)
ALT: 21 U/L (ref 17–63)
AST: 23 U/L (ref 15–41)
Albumin: 4.2 g/dL (ref 3.5–5.0)
Anion gap: 6 (ref 5–15)
BUN: 13 mg/dL (ref 6–20)
CALCIUM: 9.5 mg/dL (ref 8.9–10.3)
CO2: 28 mmol/L (ref 22–32)
CREATININE: 1.12 mg/dL (ref 0.61–1.24)
Chloride: 105 mmol/L (ref 101–111)
Glucose, Bld: 95 mg/dL (ref 65–99)
Potassium: 4.7 mmol/L (ref 3.5–5.1)
Sodium: 139 mmol/L (ref 135–145)
Total Bilirubin: 0.3 mg/dL (ref 0.3–1.2)
Total Protein: 6.6 g/dL (ref 6.5–8.1)

## 2017-04-06 LAB — LIPASE, BLOOD: Lipase: 22 U/L (ref 11–51)

## 2017-04-06 LAB — CBC
HCT: 47.6 % (ref 39.0–52.0)
Hemoglobin: 15.8 g/dL (ref 13.0–17.0)
MCH: 30.4 pg (ref 26.0–34.0)
MCHC: 33.2 g/dL (ref 30.0–36.0)
MCV: 91.7 fL (ref 78.0–100.0)
PLATELETS: 211 10*3/uL (ref 150–400)
RBC: 5.19 MIL/uL (ref 4.22–5.81)
RDW: 13.7 % (ref 11.5–15.5)
WBC: 11.1 10*3/uL — AB (ref 4.0–10.5)

## 2017-04-06 MED ORDER — SENNOSIDES-DOCUSATE SODIUM 8.6-50 MG PO TABS: 1.0000 | ORAL_TABLET | Freq: Two times a day (BID) | ORAL | 0 refills | Status: AC

## 2017-04-06 MED ORDER — POLYETHYLENE GLYCOL 3350 17 G PO PACK: 17.0000 g | PACK | Freq: Every day | ORAL | 0 refills | Status: DC

## 2017-04-06 MED ORDER — IOPAMIDOL (ISOVUE-300) INJECTION 61%
INTRAVENOUS | Status: AC
  Administered 2017-04-06: 100 mL via INTRAVENOUS
  Filled 2017-04-06: qty 100

## 2017-04-06 NOTE — ED Provider Notes (Signed)
Emergency Department Provider Note   I have reviewed the triage vital signs and the nursing notes.   HISTORY  Chief Complaint abd distention and Constipation   HPI [name] is a 56 y.o. male with PMH of hiatal hernia, HTN, PNX , and prior inguinal hernia who presents to the emergency department for evaluation of right-sided abdominal discomfort with constipation for the past week. Patient also notes appreciating a right-sided abdominal mass which is new. The mass is increased with Valsalva, standing, sitting up. He is been having mainly watery diarrhea and passing gas from last week. He did eat some Ramin yesterday which caused him to have some formed stool today. No vomiting. He has had some nausea. Patient has a history of left inguinal hernia that was repaired. He states the mass on the right side of his abdomen feels similar to this. No fevers or chills. No sick contacts. No radiation of symptoms.    Past Medical History:  Diagnosis Date  . Arthritis   . History of hiatal hernia   . Hypertension   . Lung disease    pt states based on breathing tests at work  . Pneumothorax   . Tuberculosis   . Urinary incontinence     Patient Active Problem List   Diagnosis Date Noted  . Mixed simple and mucopurulent chronic bronchitis (HCC) 01/16/2017  . Heavy cigarette smoker 11/24/2016  . BMI 31.0-31.9,adult 11/24/2016  . Hypertension   . Lower back pain 11/18/2016    Past Surgical History:  Procedure Laterality Date  . ADRENAL GLAND SURGERY     pt reports adrenal gland removed, he is uncertain which side.   Marland Kitchen CARDIAC CATHETERIZATION     pt describes a cardiac cath procedure as being completed, states it was "clear"  . ESOPHAGOGASTRODUODENOSCOPY     pt states it was abnormal, but he does not know why  . HERNIA REPAIR    . NASAL HEMORRHAGE CONTROL N/A 02/22/2016   Procedure: EPISTAXIS CONTROL;  Surgeon: Serena Colonel, MD;  Location: [facility] OR;  Service: ENT;  Laterality: N/A;     Current Outpatient Rx  . Order #: [id number] Class: Normal  . Order #: [id number] Class: Normal  . Order #: [id number] Class: Historical Med  . Order #: [id number] Class: Normal  . Order #: [id number] Class: Normal  . Order #: [id number] Class: Normal  . Order #: [id number] Class: Historical Med  . Order #: [id number] Class: Normal  . Order #: [id number] Class: Historical Med  . Order #: [id number] Class: Historical Med  . Order #: [id number] Class: Historical Med  . Order #: [id number] Class: Print  . Order #: [id number] Class: Print  . Order #: [id number] Class: Print    Allergies Patient has no known allergies.  Family History  Problem Relation Age of Onset  . Arthritis Mother   . Alcohol abuse Father   . Mental illness Father   . Early death Father   . Arthritis Brother   . Heart disease Brother     Social History Social History  Substance Use Topics  . Smoking status: Current Every Day Smoker    Packs/day: 1.00    Years: 45.00    Types: Cigarettes  . Smokeless tobacco: Never Used  . Alcohol use 7.2 oz/week    12 Cans of beer per week    Review of Systems  Constitutional: No fever/chills Eyes: No visual changes. ENT: No sore throat. Cardiovascular: Denies chest pain. Respiratory: Denies shortness of breath. Gastrointestinal: Positive abdominal pain.  No nausea, no vomiting.  No diarrhea. Positive  constipation. Genitourinary: Negative for dysuria. Musculoskeletal: Negative for back pain. Skin: Negative for rash. Neurological: Negative for headaches, focal weakness or numbness.  10-point ROS otherwise negative.  ____________________________________________   PHYSICAL EXAM:  VITAL SIGNS: ED Triage Vitals  Enc Vitals Group     BP 04/06/17 1627 (!) 122/95     Pulse Rate 04/06/17 1627 (!) 120     Resp 04/06/17 1627 18     Temp 04/06/17 1627 97.7 F (36.5 C)     Temp Source 04/06/17 1627 Oral     SpO2 04/06/17 1627 100 %     Weight 04/06/17 1624 250 lb (113.4 kg)      Height 04/06/17 1624  (1.854 m)     Pain Score 04/06/17 1624 8    Constitutional: Alert and oriented. Well appearing and in no acute distress. Eyes: Conjunctivae are normal.  Head: Atraumatic. Nose: No congestion/rhinnorhea. Mouth/Throat: Mucous membranes are moist.  Oropharynx non-erythematous. Neck: No stridor.   Cardiovascular: Normal rate, regular rhythm. Good peripheral circulation. Grossly normal heart sounds.   Respiratory: Normal respiratory effort.  No retractions. Lungs CTAB. Gastrointestinal: Soft and nontender. No distention. Positive right lateral abdominal swelling with Valsalva. No evidence of focal hernia or concern for incarceration.  Musculoskeletal: No lower extremity tenderness nor edema. No gross deformities of extremities. Neurologic:  Normal speech and language. No gross focal neurologic deficits are appreciated.  Skin:  Skin is warm, dry and intact. No rash noted.   ____________________________________________   LABS (all labs ordered are listed, but only abnormal results are displayed)  Labs Reviewed  CBC - Abnormal; Notable for the following:       Result Value   WBC 11.1 (*)    All other components within normal limits  LIPASE, BLOOD  COMPREHENSIVE METABOLIC PANEL  URINALYSIS, ROUTINE W REFLEX MICROSCOPIC  POC OCCULT BLOOD, ED   ____________________________________________  RADIOLOGY  Ct Abdomen Pelvis W Contrast  Result Date: 04/06/2017 CLINICAL DATA:  Difficulty with bowel movements. Right-sided abdominal distention 3 days ago. Back pain. EXAM: CT ABDOMEN AND PELVIS WITH CONTRAST TECHNIQUE: Multidetector CT imaging of the abdomen and pelvis was performed using the standard protocol following bolus administration of intravenous contrast. CONTRAST:  ISOVUE-300 IOPAMIDOL (ISOVUE-300) INJECTION 61% COMPARISON:  11/18/2016 lumbar spine radiographs FINDINGS: Lower chest: Left lower lobe subpleural reticular densities may reflect mild  bronchiolitic change or atelectasis. Hepatobiliary: No focal liver abnormality is seen. No gallstones, gallbladder wall thickening, or biliary dilatation. Pancreas: Unremarkable. No pancreatic ductal dilatation or surrounding inflammatory changes. Spleen: Normal in size without focal abnormality. Adrenals/Urinary Tract: Bilateral hypodense nodules of the adrenal glands measuring 18 mm on the left and 21 mm on the right consistent with adenomas. The kidneys enhance symmetrically without obstructive uropathy or mass. A urachal remnant is noted off the anterior aspect of the bladder. Stomach/Bowel: The stomach is partially distended with food. There is normal small bowel rotation without obstruction or inflammation. A moderate degree of fecal residue is seen within large bowel consistent with constipation. There is descending and sigmoid diverticulosis without acute diverticulitis. Normal-appearing appendix. Vascular/Lymphatic: Aortic atherosclerosis. No enlarged abdominal or pelvic lymph nodes. Reproductive: Prostate and seminal vesicles are nonacute. Minimal central zone calcifications of prostate are visualized. Other: Fat containing inguinal canals bilaterally may represent spermatic cord lipomas or herniation of properitoneal fat. Musculoskeletal: Mild degenerative disc disease L3 through S1 most marked at L5-S1. No acute osseous abnormality. IMPRESSION: 1. No acute bowel obstruction or inflammation. Moderate amount of fecal residue within large  bowel raising the question of constipation. 2. Minimal left lower lobe bronchiolitic change or atelectasis. 3. Low-density lesions of both adrenal glands consistent with benign adenomas. 4. Fat containing inguinal canals which may represent spermatic cord lipoma is or herniation of properitoneal fat. 5. Lower lumbar degenerative disc disease. Electronically Signed   By: Tollie Eth M.D.   On: 04/06/2017 18:14     ____________________________________________   PROCEDURES  Procedure(s) performed:   Procedures  None ____________________________________________   INITIAL IMPRESSION / ASSESSMENT AND PLAN / ED COURSE  Pertinent labs & imaging results that were available during my care of the patient were reviewed by me and considered in my medical decision making (see chart for details).  Patient resents to the emergency department for evaluation of abdominal pain and right sided mass. If he performs Valsalva there is an appreciable mass in the right mid abdomen. No evidence of incarcerated hernia. He has focal tenderness in this area. Very low suspicion for vascular etiology given its location and character. Plan for labs, CT abdomen pelvis, reassessment.  Ct with no acute findings. Does have continued stool burden. In terms of the patient right sided mass this could be a developing abdominal muscle wall weakness and at risk for hernia development. Will advise continued constipation mgmt at home and PCP follow up. Discussed return precautions for hernia development and SBO in detail with the patient and wife at bedside.   At this time, I do not feel there is any life-threatening condition present. I have reviewed and discussed all results (EKG, imaging, lab, urine as appropriate), exam findings with patient. I have reviewed nursing notes and appropriate previous records.  I feel the patient is safe to be discharged home without further emergent workup. Discussed usual and customary return precautions. Patient and family (if present) verbalize understanding and are comfortable with this plan.  Patient will follow-up with their primary care provider. If they do not have a primary care provider, information for follow-up has been provided to them. All questions have been answered.  ____________________________________________  FINAL CLINICAL IMPRESSION(S) / ED DIAGNOSES  Final diagnoses:  Right  lower quadrant abdominal pain     MEDICATIONS GIVEN DURING THIS VISIT:  Medications  iopamidol (ISOVUE-300) 61 % injection (100 mLs Intravenous Contrast Given 04/06/17 1750)     NEW OUTPATIENT MEDICATIONS STARTED DURING THIS VISIT:  Discharge Medication List as of 04/06/2017  6:57 PM    START taking these medications   Details  polyethylene glycol (MIRALAX) packet Take 17 g by mouth daily., Starting Mon 04/06/2017, Print    senna-docusate (SENOKOT-S) 8.6-50 MG tablet Take 1 tablet by mouth 2 (two) times daily., Starting Mon 04/06/2017, Until Sat 04/11/2017, Print         Note:  This document was prepared using Dragon voice recognition software and may include unintentional dictation errors.  Alona Bene, MD Emergency Medicine   Maia Plan, MD 04/07/17 [phone]

## 2017-04-06 NOTE — Discharge Instructions (Signed)

## 2017-04-06 NOTE — ED Triage Notes (Signed)
Patient states that had problems having BM x week. Tried mag citrate and other OTC medications. Patient noticed right sided adb distention x 3 days ago.  Patient also had lots of back pain.

## 2017-04-29 ENCOUNTER — Other Ambulatory Visit: Payer: Self-pay | Admitting: *Deleted

## 2017-04-29 DIAGNOSIS — I10 Essential (primary) hypertension: Secondary | ICD-10-CM

## 2017-04-29 MED ORDER — HYDRALAZINE HCL 10 MG PO TABS: 10.0000 mg | ORAL_TABLET | Freq: Three times a day (TID) | ORAL | 0 refills | Status: DC

## 2017-05-26 ENCOUNTER — Other Ambulatory Visit: Payer: Self-pay | Admitting: *Deleted

## 2017-05-26 DIAGNOSIS — I10 Essential (primary) hypertension: Secondary | ICD-10-CM

## 2017-05-26 DIAGNOSIS — J418 Mixed simple and mucopurulent chronic bronchitis: Secondary | ICD-10-CM

## 2017-05-26 MED ORDER — BUDESONIDE-FORMOTEROL FUMARATE 160-4.5 MCG/ACT IN AERO: 2.0000 | INHALATION_SPRAY | Freq: Two times a day (BID) | RESPIRATORY_TRACT | 0 refills | Status: DC

## 2017-05-26 MED ORDER — HYDRALAZINE HCL 10 MG PO TABS: 10.0000 mg | ORAL_TABLET | Freq: Three times a day (TID) | ORAL | 0 refills | Status: DC

## 2017-06-25 ENCOUNTER — Other Ambulatory Visit: Payer: Self-pay | Admitting: *Deleted

## 2017-06-25 MED ORDER — AMLODIPINE BESYLATE 10 MG PO TABS: 10.0000 mg | ORAL_TABLET | Freq: Every day | ORAL | 0 refills | Status: DC

## 2017-06-25 NOTE — Telephone Encounter (Signed)
Amlodipine refilled 30 day supply . Left message patient needs office visit prior to anymore refills.

## 2017-07-01 ENCOUNTER — Other Ambulatory Visit: Payer: Self-pay | Admitting: *Deleted

## 2017-07-01 DIAGNOSIS — I10 Essential (primary) hypertension: Secondary | ICD-10-CM

## 2017-07-01 MED ORDER — LISINOPRIL-HYDROCHLOROTHIAZIDE 20-25 MG PO TABS: 2.0000 | ORAL_TABLET | Freq: Every day | ORAL | 0 refills | Status: DC

## 2017-07-01 MED ORDER — METOPROLOL SUCCINATE ER 25 MG PO TB24: 50.0000 mg | ORAL_TABLET | Freq: Every day | ORAL | 0 refills | Status: DC

## 2017-07-01 MED ORDER — HYDRALAZINE HCL 10 MG PO TABS: 10.0000 mg | ORAL_TABLET | Freq: Three times a day (TID) | ORAL | 0 refills | Status: DC

## 2017-07-01 NOTE — Telephone Encounter (Signed)
Refilled patients BP meds for 30 day supply left message on patient voice mail he was due for follow up appt in May of this year and we will not refill medications again until seen in office.advised patient to call to schedule appt prior to running out of medication.

## 2017-08-04 ENCOUNTER — Other Ambulatory Visit: Payer: Self-pay | Admitting: *Deleted

## 2017-08-04 DIAGNOSIS — I10 Essential (primary) hypertension: Secondary | ICD-10-CM

## 2017-08-04 MED ORDER — HYDRALAZINE HCL 10 MG PO TABS: 10.0000 mg | ORAL_TABLET | Freq: Three times a day (TID) | ORAL | 0 refills | Status: DC

## 2017-08-04 MED ORDER — LISINOPRIL-HYDROCHLOROTHIAZIDE 20-25 MG PO TABS: 2.0000 | ORAL_TABLET | Freq: Every day | ORAL | 0 refills | Status: DC

## 2017-08-04 NOTE — Telephone Encounter (Signed)
14 day supply of patients lisinopril-HCTZ sent to pharmacy patient needs office visit prior to anymore refills . Called and left message for patient to contact our office to schedule an appt.

## 2017-08-12 ENCOUNTER — Emergency Department (HOSPITAL_COMMUNITY): Payer: BLUE CROSS/BLUE SHIELD

## 2017-08-12 ENCOUNTER — Observation Stay (HOSPITAL_COMMUNITY)
Admission: EM | Admit: 2017-08-12 | Discharge: 2017-08-14 | Disposition: A | Discharge status: Home / Self Care | Payer: BLUE CROSS/BLUE SHIELD | Attending: Cardiovascular Disease | Admitting: Cardiovascular Disease

## 2017-08-12 ENCOUNTER — Encounter (HOSPITAL_COMMUNITY): Payer: Self-pay | Admitting: *Deleted

## 2017-08-12 ENCOUNTER — Other Ambulatory Visit: Payer: Self-pay | Admitting: *Deleted

## 2017-08-12 DIAGNOSIS — J449 Chronic obstructive pulmonary disease, unspecified: Secondary | ICD-10-CM | POA: Insufficient documentation

## 2017-08-12 DIAGNOSIS — I10 Essential (primary) hypertension: Secondary | ICD-10-CM

## 2017-08-12 DIAGNOSIS — Z7982 Long term (current) use of aspirin: Secondary | ICD-10-CM | POA: Insufficient documentation

## 2017-08-12 DIAGNOSIS — I959 Hypotension, unspecified: Secondary | ICD-10-CM | POA: Diagnosis not present

## 2017-08-12 DIAGNOSIS — Z79899 Other long term (current) drug therapy: Secondary | ICD-10-CM | POA: Insufficient documentation

## 2017-08-12 DIAGNOSIS — R000 Tachycardia, unspecified: Secondary | ICD-10-CM | POA: Diagnosis not present

## 2017-08-12 DIAGNOSIS — I481 Persistent atrial fibrillation: Principal | ICD-10-CM | POA: Insufficient documentation

## 2017-08-12 DIAGNOSIS — N179 Acute kidney failure, unspecified: Secondary | ICD-10-CM

## 2017-08-12 DIAGNOSIS — F17210 Nicotine dependence, cigarettes, uncomplicated: Secondary | ICD-10-CM | POA: Diagnosis not present

## 2017-08-12 DIAGNOSIS — I4891 Unspecified atrial fibrillation: Secondary | ICD-10-CM | POA: Diagnosis not present

## 2017-08-12 DIAGNOSIS — I4819 Other persistent atrial fibrillation: Secondary | ICD-10-CM | POA: Diagnosis present

## 2017-08-12 DIAGNOSIS — R42 Dizziness and giddiness: Secondary | ICD-10-CM | POA: Diagnosis not present

## 2017-08-12 DIAGNOSIS — E861 Hypovolemia: Secondary | ICD-10-CM | POA: Diagnosis not present

## 2017-08-12 DIAGNOSIS — Z87448 Personal history of other diseases of urinary system: Secondary | ICD-10-CM | POA: Diagnosis present

## 2017-08-12 HISTORY — DX: Acute kidney failure, unspecified: N17.9

## 2017-08-12 HISTORY — DX: Other persistent atrial fibrillation: I48.19

## 2017-08-12 LAB — COMPREHENSIVE METABOLIC PANEL
ALBUMIN: 4.1 g/dL (ref 3.5–5.0)
ALT: 76 U/L — ABNORMAL HIGH (ref 17–63)
ANION GAP: 14 (ref 5–15)
AST: 54 U/L — ABNORMAL HIGH (ref 15–41)
Alkaline Phosphatase: 45 U/L (ref 38–126)
BUN: 22 mg/dL — ABNORMAL HIGH (ref 6–20)
CHLORIDE: 97 mmol/L — AB (ref 101–111)
CO2: 24 mmol/L (ref 22–32)
Calcium: 9.6 mg/dL (ref 8.9–10.3)
Creatinine, Ser: 2.63 mg/dL — ABNORMAL HIGH (ref 0.61–1.24)
GFR calc Af Amer: 29 mL/min — ABNORMAL LOW (ref >60)
GFR calc non Af Amer: 25 mL/min — ABNORMAL LOW (ref >60)
GLUCOSE: 110 mg/dL — AB (ref 65–99)
POTASSIUM: 4.1 mmol/L (ref 3.5–5.1)
SODIUM: 135 mmol/L (ref 135–145)
Total Bilirubin: 1.5 mg/dL — ABNORMAL HIGH (ref 0.3–1.2)
Total Protein: 6.4 g/dL — ABNORMAL LOW (ref 6.5–8.1)

## 2017-08-12 LAB — CBC
HCT: 51.5 % (ref 39.0–52.0)
HEMOGLOBIN: 17.8 g/dL — AB (ref 13.0–17.0)
MCH: 30.3 pg (ref 26.0–34.0)
MCHC: 34.6 g/dL (ref 30.0–36.0)
MCV: 87.7 fL (ref 78.0–100.0)
PLATELETS: 218 10*3/uL (ref 150–400)
RBC: 5.87 MIL/uL — AB (ref 4.22–5.81)
RDW: 14.4 % (ref 11.5–15.5)
WBC: 17.2 10*3/uL — ABNORMAL HIGH (ref 4.0–10.5)

## 2017-08-12 LAB — I-STAT TROPONIN, ED: Troponin i, poc: 0.01 ng/mL (ref 0.00–0.08)

## 2017-08-12 MED ORDER — AMLODIPINE BESYLATE 10 MG PO TABS: 10.0000 mg | ORAL_TABLET | Freq: Every day | ORAL | 0 refills | Status: DC

## 2017-08-12 MED ORDER — SODIUM CHLORIDE 0.9 % IV SOLN
INTRAVENOUS | Status: AC
  Administered 2017-08-13 (×2): via INTRAVENOUS

## 2017-08-12 MED ORDER — HEPARIN (PORCINE) IN NACL 100-0.45 UNIT/ML-% IJ SOLN
1700.0000 [IU]/h | INTRAMUSCULAR | Status: DC
  Administered 2017-08-12: 1200 [IU]/h via INTRAVENOUS
  Administered 2017-08-13: 1500 [IU]/h via INTRAVENOUS
  Administered 2017-08-14: 1700 [IU]/h via INTRAVENOUS
  Filled 2017-08-12 (×3): qty 250

## 2017-08-12 MED ORDER — HEPARIN BOLUS VIA INFUSION
4000.0000 [IU] | Freq: Once | INTRAVENOUS | Status: AC
  Administered 2017-08-12: 4000 [IU] via INTRAVENOUS
  Filled 2017-08-12: qty 4000

## 2017-08-12 MED ORDER — ALBUTEROL SULFATE (2.5 MG/3ML) 0.083% IN NEBU: 3.0000 mL | INHALATION_SOLUTION | Freq: Four times a day (QID) | RESPIRATORY_TRACT | Status: DC | PRN

## 2017-08-12 MED ORDER — MOMETASONE FURO-FORMOTEROL FUM 200-5 MCG/ACT IN AERO
2.0000 | INHALATION_SPRAY | Freq: Two times a day (BID) | RESPIRATORY_TRACT | Status: DC
  Administered 2017-08-13: 2 via RESPIRATORY_TRACT
  Filled 2017-08-12 (×2): qty 8.8

## 2017-08-12 MED ORDER — ONDANSETRON HCL 4 MG/2ML IJ SOLN: 4.0000 mg | Freq: Four times a day (QID) | INTRAMUSCULAR | Status: DC | PRN

## 2017-08-12 MED ORDER — ADULT MULTIVITAMIN W/MINERALS CH
1.0000 | ORAL_TABLET | Freq: Every day | ORAL | Status: DC
  Administered 2017-08-13 – 2017-08-14 (×2): 1 via ORAL
  Filled 2017-08-12 (×2): qty 1

## 2017-08-12 MED ORDER — ACETAMINOPHEN 325 MG PO TABS: 650.0000 mg | ORAL_TABLET | ORAL | Status: DC | PRN

## 2017-08-12 NOTE — Progress Notes (Signed)
ANTICOAGULATION CONSULT NOTE - Initial Consult  Pharmacy Consult for heparin Indication: atrial fibrillation  No Known Allergies  Patient Measurements: Height: 6\' 2"  (188 cm) Weight: 260 lb (117.9 kg) IBW/kg (Calculated) : 82.2 Heparin Dosing Weight: 106 kg   Vital Signs: Temp: 98.6 F (37 C) (09/05 1947) Temp Source: Oral (09/05 1947) BP: 72/58 (09/05 2021) Pulse Rate: 145 (09/05 1947)  Labs:  Recent Labs  08/12/17 2001  HGB 17.8*  HCT 51.5  PLT 218    CrCl cannot be calculated (Patient's most recent lab result is older than the maximum 21 days allowed.).   Medical History: Past Medical History:  Diagnosis Date  . Arthritis   . History of hiatal hernia   . Hypertension   . Lung disease    pt states based on breathing tests at work  . Pneumothorax   . Tuberculosis   . Urinary incontinence     Medications:   (Not in a hospital admission)  Assessment: 56 YOM to start IV heparin for Afib. Patient thinks he was on Pradaxa in the past but stopped taking his anticoagulant due to a nosebleed. Hgb 17.8. Plt 218k   Goal of Therapy:  Heparin level 0.3-0.7 units/ml Monitor platelets by anticoagulation protocol: Yes   Plan:  -IV heparin 4000 units once, then heparin infusion at 1200 units/hr -F/u 6 hr HL -Monitor CBC, daily HL and s/s of bleeding   Vinnie LevelBenjamin Dawnmarie Breon, PharmD., BCPS Clinical Pharmacist Pager [phone]

## 2017-08-12 NOTE — ED Triage Notes (Signed)
Per EMS: pt from work, called out for dizziness.  Reports that pt was hypotensive (90s systolic), 1000cc NS bolus.  HR in the 180s.  6mg , 12mg , 12mg  given without relief.

## 2017-08-12 NOTE — ED Notes (Signed)
Pt aware urine needed.  

## 2017-08-12 NOTE — Telephone Encounter (Signed)
14 day supply of amlodipine sent to pharmacy patient needs appt prior to anymore refills. Patient aware.

## 2017-08-12 NOTE — H&P (Signed)
Cardiology History & Physical    Patient ID: [name] MRN: [id number], DOB: [birth year] Date of Encounter: 08/12/2017, 9:24 PM Primary Physician: Ma Hillock, DO  Chief Complaint: Lightheadedness, tachycardia   HPI: [name] is a 56 y.o. male with history of HTN, AF (previously on dabigatran), COPD/active smoking, who presents with lightheadedness and tachycardia.  Pt was travelling in [place] last week and missed all of his regular medications.  He otherwise felt well until this afternoon, when he was walking in the heat at work (he's an Conservation officer, nature, when he noticed the onset of lightheadedness and significant diaphoresis.  He denied any associated CP or SOB.  He ultimately called EMS, who found him to be tachycardic to the 180s and hypotensive with SBPs in the 80s.  He was bolused with 1 L IVF and given adenosine 6 mg and 12 mg without effect.  In the ED, his Bps were initially in the 70s-80s, and Hrs were in the 140s.  ECG showed AF with RVR.  He was given a bolus of IV heaprin and started on a gtt for anticoagulation.Upon my exam after a second liter of IVF, his BP was 98/72 with HR in the 110s-130.  Labs notable for WBC 17.2, Cr 2.6, up from normal previously.  His lightheadedness was much improved and he denied any other complaints.  Of note, pt was previously on Pradaxa for stroke prevention with AF, but stopped after hearing of the risk of death on TV ads.  He reports a history of difficult to control BP with a history of an adrenal tumor, although there aren't clear records of this in the chart.  On ROS, he denies any recent illnesses, N/V/D, urinary symptoms, fevers/chills, cough, PND, orthopnea, LE edema or frank syncope.  Past Medical History:  Diagnosis Date  . Arthritis   . History of hiatal hernia   . Hypertension   . Lung disease    pt states based on breathing tests at work  . Pneumothorax   . Tuberculosis   . Urinary incontinence      Surgical History:    Past Surgical History:  Procedure Laterality Date  . ADRENAL GLAND SURGERY     pt reports adrenal gland removed, he is uncertain which side.   Marland Kitchen CARDIAC CATHETERIZATION     pt describes a cardiac cath procedure as being completed, states it was "clear"  . ESOPHAGOGASTRODUODENOSCOPY     pt states it was abnormal, but he does not know why  . HERNIA REPAIR    . NASAL HEMORRHAGE CONTROL N/A 02/22/2016   Procedure: EPISTAXIS CONTROL;  Surgeon: Izora Gala, MD;  Location: [facility];  Service: ENT;  Laterality: N/A;     Home Meds: Prior to Admission medications   Medication Sig Start Date End Date Taking? Authorizing Provider  albuterol (PROVENTIL HFA;VENTOLIN HFA) 108 (90 Base) MCG/ACT inhaler Inhale 2 puffs into the lungs every 6 (six) hours as needed for wheezing or shortness of breath. 11/20/16   Patrecia Pour, MD  amLODipine (NORVASC) 10 MG tablet Take 1 tablet (10 mg total) by mouth daily. Needs office visit prior to anymore refills 08/12/17   McGowen, Adrian Blackwater, MD  Aspirin-Caffeine (BAYER BACK & BODY) 500-32.5 MG TABS Take 3 tablets by mouth daily as needed (pain).    [provider]  Blood Pressure Monitoring (ADULT BLOOD PRESSURE CUFF LG) Juan 1 adult xl BP cuff Juan. 11/24/16   Kuneff, Renee A, DO  budesonide-formoterol (SYMBICORT) 160-4.5 MCG/ACT  inhaler Inhale 2 puffs into the lungs 2 (two) times daily. Needs office visit prior to anymore refills 05/26/17   Kuneff, Renee A, DO  hydrALAZINE (APRESOLINE) 10 MG tablet Take 1 tablet (10 mg total) by mouth 3 (three) times daily. Needs office visit prior to anymore refills. 08/04/17   Kuneff, Renee A, DO  lisinopril-hydrochlorothiazide (PRINZIDE,ZESTORETIC) 20-25 MG tablet Take 2 tablets by mouth daily. Needs office visit prior to anymore refills 08/04/17   Kuneff, Renee A, DO  magnesium citrate SOLN Take 2 Bottles by mouth once.    [provider]  metoprolol succinate (TOPROL-XL) 25 MG 24 hr tablet Take 2 tablets (50 mg total) by  mouth daily. Needs office visit prior to anymore refills. 07/01/17   Kuneff, Renee A, DO  mineral oil liquid Take 45 mLs by mouth daily as needed for moderate constipation.    [provider]  Multiple Vitamin (MULTIVITAMIN WITH MINERALS) TABS tablet Take 1 tablet by mouth daily.    [provider]  peg 3350 powder (MOVIPREP) 100 g SOLR Take 1 Juan by mouth once.    [provider]  polyethylene glycol (MIRALAX) packet Take 17 g by mouth daily. 04/06/17   Long, Wonda Olds, MD    Allergies: No Known Allergies  Social History   Social History  . Marital status: Married    Spouse name: Burman Nieves  . Number of children: N/A  . Years of education: 51   Occupational History  . Electrical engineer    Social History Main Topics  . Smoking status: Current Every Day Smoker    Packs/day: 1.00    Years: 45.00    Types: Cigarettes  . Smokeless tobacco: Never Used  . Alcohol use 7.2 oz/week    12 Cans of beer per week  . Drug use: No  . Sexual activity: Yes    Partners: Female    Birth control/ protection: None     Comment: married   Other Topics Concern  . Not on file   Social History Narrative   married to [place].   15 years education. 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    Drinks caffeine, takes a daily vitamin.   Wears a seatbelt. Smoke detector in the home.   Wears dentures.   Feels safe in relationships.     Family History  Problem Relation Age of Onset  . Arthritis Mother   . Alcohol abuse Father   . Mental illness Father   . Early death Father   . Arthritis Brother   . Heart disease Brother     Review of Systems: All other systems reviewed and are otherwise negative except as noted above.  Labs:   Lab Results  Component Value Date   WBC 17.2 (H) 08/12/2017   HGB 17.8 (H) 08/12/2017   HCT 51.5 08/12/2017   MCV 87.7 08/12/2017   PLT 218 08/12/2017    Recent Labs Lab 08/12/17 2001  NA 135  K 4.1  CL 97*  CO2 24  BUN 22*  CREATININE 2.63*    CALCIUM 9.6  PROT 6.4*  BILITOT 1.5*  ALKPHOS 45  ALT 76*  AST 54*  GLUCOSE 110*   No results for input(s): CKTOTAL, CKMB, TROPONINI in the last 72 hours. No results found for: CHOL, HDL, LDLCALC, TRIG No results found for: DDIMER  Radiology/Studies:  Dg Chest Portable 1 View  Result Date: 08/12/2017 CLINICAL DATA:  Tachycardia EXAM: PORTABLE CHEST 1 VIEW COMPARISON:  11/18/2016 FINDINGS: The heart size and mediastinal contours  are within normal limits. Both lungs are clear. The visualized skeletal structures are unremarkable. IMPRESSION: No active disease. Electronically Signed   By: Franchot Gallo M.D.   On: 08/12/2017 20:49   Wt Readings from Last 3 Encounters:  08/12/17 117.9 kg (260 lb)  04/06/17 113.4 kg (250 lb)  01/22/17 114 kg (251 lb 4 oz)    EKG: AF with RVR, rate 158  Physical Exam: Blood pressure (!) 72/58, pulse (!) 145, temperature 98.6 F (37 C), temperature source Oral, resp. rate 18, height '6\' 2"'  (1.88 m), weight 117.9 kg (260 lb), SpO2 97 %. Body mass index is 33.38 kg/m. General: Well developed, well nourished, in no acute distress. Head: Normocephalic, atraumatic, sclera non-icteric, no xanthomas, nares are without discharge.  Neck: Negative for carotid bruits. JVD not elevated. Lungs: Clear bilaterally to auscultation without wheezes, rales, or rhonchi. Breathing is unlabored. Heart: Tachycardic, irregular. No murmurs, rubs, or gallops appreciated. Abdomen: Soft, non-tender, non-distended with normoactive bowel sounds. No hepatomegaly. No rebound/guarding. No obvious abdominal masses. Msk:  Strength and tone appear normal for age. Extremities: No clubbing or cyanosis. No edema.  Distal pedal pulses are 2+ and equal bilaterally. Neuro: Alert and oriented X 3. No focal deficit. No facial asymmetry. Moves all extremities spontaneously. Psych:  Responds to questions appropriately with a normal affect.    Assessment and Plan   56 y.o. male with history of  HTN, AF (previously on dabigatran), COPD/active smoking, who presents with rapid AF, also with AKI.  1.  AF:  Unclear trigger, but hypovolemia likely playing a role.  HR and BP improved following IVF.  If BP remains stable to improving (last 98/72), will initiate AV nodal blockade.  If BP borderline, would favor IV amiodarone to avoid further hypotension.  At no point has he been frankly unstable, so emergent DCCV is not warranted at present.  Continue IV heparin for therapeutic anticoagulation.  Would consider TEE-DCCV in AM if still in AF.  2.  HTN:  Holding home antihypertensives given hypotension on presentation.  3.  AKI: Likely in setting of significant hypovolemia.  Continue aggressive IVF resuscitation.  Check UA, urine electrolytes.  Consider renal U/S if not improving in AM with IVF.  Holding lisinopril, as above.  Merrilee Seashore MD 08/12/2017, 9:24 PM

## 2017-08-12 NOTE — ED Provider Notes (Signed)
Emergency Department Provider Note   I have reviewed the triage vital signs and the nursing notes.   HISTORY  Chief Complaint Tachycardia   HPI [name] is a 56 y.o. male with PMH of HTN and possible PMH of afib presents to the emergency department for evaluation of lightheadedness and leg pain. The patient reports walking more than normal today at which point he became suddenly lightheaded. He denies any chest pain, difficulty breathing or heart palpitations. His leg pain was transient and felt like a cramping in both legs with some associated numbness. No fevers or chills. Patient has been compliant with his home blood pressure medications, including metoprolol. He states that he may have been told that he had A. Fib in the past and was on he thinks Pradaxa but stopped when he got a severe nosebleed.  EMS was called who gave adenosine x 3 with no response. Was hypotensive en route.    Past Medical History:  Diagnosis Date  . Arthritis   . History of hiatal hernia   . Hypertension   . Lung disease    pt states based on breathing tests at work  . Pneumothorax   . Tuberculosis   . Urinary incontinence     Patient Active Problem List   Diagnosis Date Noted  . Atrial fibrillation (HCC) 08/12/2017  . Mixed simple and mucopurulent chronic bronchitis (HCC) 01/16/2017  . Heavy cigarette smoker 11/24/2016  . BMI 31.0-31.9,adult 11/24/2016  . Hypertension   . Lower back pain 11/18/2016    Past Surgical History:  Procedure Laterality Date  . ADRENAL GLAND SURGERY     pt reports adrenal gland removed, he is uncertain which side.   Marland Kitchen CARDIAC CATHETERIZATION     pt describes a cardiac cath procedure as being completed, states it was "clear"  . ESOPHAGOGASTRODUODENOSCOPY     pt states it was abnormal, but he does not know why  . HERNIA REPAIR    . NASAL HEMORRHAGE CONTROL N/A 02/22/2016   Procedure: EPISTAXIS CONTROL;  Surgeon: Serena Colonel, MD;  Location: [facility] OR;  Service:  ENT;  Laterality: N/A;    Current Outpatient Rx  . Order #: [id number] Class: Normal  . Order #: [id number] Class: Normal  . Order #: [id number] Class: Historical Med  . Order #: [id number] Class: Normal  . Order #: [id number] Class: Historical Med  . Order #: [id number] Class: Normal  . Order #: [id number] Class: Normal  . Order #: [id number] Class: Normal  . Order #: [id number] Class: Historical Med  . Order #: [id number] Class: Print    Allergies Patient has no known allergies.  Family History  Problem Relation Age of Onset  . Arthritis Mother   . Alcohol abuse Father   . Mental illness Father   . Early death Father   . Arthritis Brother   . Heart disease Brother     Social History Social History  Substance Use Topics  . Smoking status: Current Every Day Smoker    Packs/day: 1.00    Years: 45.00    Types: Cigarettes  . Smokeless tobacco: Never Used  . Alcohol use 7.2 oz/week    12 Cans of beer per week    Review of Systems  Constitutional: No fever/chills. Positive lightheadedness.  Eyes: No visual changes. ENT: No sore throat. Cardiovascular: Denies chest pain. Respiratory: Denies shortness of breath. Gastrointestinal: No abdominal pain.  No nausea, no vomiting.  No diarrhea.  No constipation. Genitourinary: Negative for dysuria. Musculoskeletal: Negative for back pain. Positive leg pain earlier today.  Skin: Negative for rash. Neurological: Negative for headaches, focal weakness or numbness.  10-point ROS otherwise negative.  ____________________________________________   PHYSICAL EXAM:  VITAL SIGNS: ED Triage Vitals  Enc Vitals Group     BP 08/12/17 1947 (!) 82/62     Pulse Rate 08/12/17 1947 (!) 145     Resp 08/12/17 1947 18     Temp 08/12/17 1947 98.6 F (37 C)     Temp Source 08/12/17 1947 Oral     SpO2 08/12/17 1947 97 %     Weight 08/12/17 1946 260 lb (117.9 kg)     Height 08/12/17 1946 6\' 2"  (1.88 m)     Pain Score 08/12/17 1943 1   Constitutional:  Alert and oriented. Well appearing and in no acute distress. Eyes: Conjunctivae are normal.  Head: Atraumatic. Nose: No congestion/rhinnorhea. Mouth/Throat: Mucous membranes are dry. Neck: No stridor.   Cardiovascular: A-fib RVR. Grossly normal heart sounds. Extremities are somewhat cool with normal color.  Respiratory: Normal respiratory effort.  No retractions. Lungs CTAB. Gastrointestinal: Soft and nontender. No distention.  Musculoskeletal: No lower extremity tenderness nor edema. No gross deformities of extremities. Neurologic:  Normal speech and language. No gross focal neurologic deficits are appreciated.  Skin:  Skin is dry and intact. No rash noted.   ____________________________________________   LABS (all labs ordered are listed, but only abnormal results are displayed)  Labs Reviewed  CBC - Abnormal; Notable for the following:       Result Value   WBC 17.2 (*)    RBC 5.87 (*)    Hemoglobin 17.8 (*)    All other components within normal limits  COMPREHENSIVE METABOLIC PANEL - Abnormal; Notable for the following:    Chloride 97 (*)    Glucose, Bld 110 (*)    BUN 22 (*)    Creatinine, Ser 2.63 (*)    Total Protein 6.4 (*)    AST 54 (*)    ALT 76 (*)    Total Bilirubin 1.5 (*)    GFR calc non Af Amer 25 (*)    GFR calc Af Amer 29 (*)    All other components within normal limits  URINALYSIS, ROUTINE W REFLEX MICROSCOPIC  HIV ANTIBODY (ROUTINE TESTING)  COMPREHENSIVE METABOLIC PANEL  CBC  NA AND K (SODIUM & POTASSIUM), RAND UR  CREATININE, URINE, RANDOM  HEPARIN LEVEL (UNFRACTIONATED)  I-STAT TROPONIN, ED   ____________________________________________  EKG  A-fib with RVR. Reviewed in MUSE. No STEMI.  ____________________________________________  RADIOLOGY  Dg Chest Portable 1 View  Result Date: 08/12/2017 CLINICAL DATA:  Tachycardia EXAM: PORTABLE CHEST 1 VIEW COMPARISON:  11/18/2016 FINDINGS: The heart size and mediastinal contours are within normal  limits. Both lungs are clear. The visualized skeletal structures are unremarkable. IMPRESSION: No active disease. Electronically Signed   By: [name] M.D.   On: 08/12/2017 20:49    ____________________________________________   PROCEDURES  Procedure(s) performed:   Procedures  CRITICAL CARE Performed by: Maia Plan Total critical care time: 60 minutes Critical care time was exclusive of separately billable procedures and treating other patients. Critical care was necessary to treat or prevent imminent or life-threatening deterioration. Critical care was time spent personally by me on the following activities: development of treatment plan with patient and/or surrogate as well as nursing, discussions with consultants, evaluation of patient's response to treatment, examination of patient, obtaining history from patient or surrogate, ordering and performing treatments and interventions, ordering and review of laboratory studies, ordering and review  of radiographic studies, pulse oximetry and re-evaluation of patient's condition.  Alona BeneJoshua Long, MD Emergency Medicine  ____________________________________________   INITIAL IMPRESSION / ASSESSMENT AND PLAN / ED COURSE  Pertinent labs & imaging results that were available during my care of the patient were reviewed by me and considered in my medical decision making (see chart for details).  Patient EKG shows A. Fib RVR. Manual blood pressure in the 80s but patient is awake and alert. He is providing entire history and is in no acute distress. Discussed the case immediately with cardiology on-call, Dr. Allena Katzhakravartti. I have some concern that the patient may have underlying history of A. Fib, not anticoagulated, with paroxysmal symptoms as he reports being occasionally dizzy. His blood pressure low but he is clinically well appearing.   BP improving with IVF. HR remains elevated but also decreasing slightly. Patient remains clinically  well-appearing. He appears to be in new renal failure which may be contributing to hypotension. BP remains too low for Diltiazem infusion.   Discussed patient's case with Cardiology to request admission. Patient and family (if present) updated with plan. Care transferred to Cardiology service.  I reviewed all nursing notes, vitals, pertinent old records, EKGs, labs, imaging (as available).  ____________________________________________  FINAL CLINICAL IMPRESSION(S) / ED DIAGNOSES  Final diagnoses:  Atrial fibrillation with RVR (HCC)  Hypotension, unspecified hypotension type     MEDICATIONS GIVEN DURING THIS VISIT:  Medications  heparin ADULT infusion 100 units/mL (25000 units/27250mL sodium chloride 0.45%) (1,200 Units/hr Intravenous New Bag/Given 08/12/17 2051)  acetaminophen (TYLENOL) tablet 650 mg (not administered)  ondansetron (ZOFRAN) injection 4 mg (not administered)  albuterol (PROVENTIL) (2.5 MG/3ML) 0.083% nebulizer solution 3 mL (not administered)  mometasone-formoterol (DULERA) 200-5 MCG/ACT inhaler 2 puff (not administered)  multivitamin with minerals tablet 1 tablet (not administered)  0.9 %  sodium chloride infusion (not administered)  heparin bolus via infusion 4,000 Units (4,000 Units Intravenous Bolus from Bag 08/12/17 2055)     NEW OUTPATIENT MEDICATIONS STARTED DURING THIS VISIT:  None  Note:  This document was prepared using Dragon voice recognition software and may include unintentional dictation errors.  Alona BeneJoshua Long, MD Emergency Medicine    Long, Arlyss RepressJoshua G, MD 08/12/17 [phone]

## 2017-08-13 ENCOUNTER — Encounter (HOSPITAL_COMMUNITY): Payer: Self-pay | Admitting: Physician Assistant

## 2017-08-13 ENCOUNTER — Observation Stay (HOSPITAL_BASED_OUTPATIENT_CLINIC_OR_DEPARTMENT_OTHER): Payer: BLUE CROSS/BLUE SHIELD

## 2017-08-13 DIAGNOSIS — I959 Hypotension, unspecified: Secondary | ICD-10-CM | POA: Diagnosis present

## 2017-08-13 DIAGNOSIS — N179 Acute kidney failure, unspecified: Secondary | ICD-10-CM

## 2017-08-13 DIAGNOSIS — I4891 Unspecified atrial fibrillation: Secondary | ICD-10-CM | POA: Diagnosis not present

## 2017-08-13 DIAGNOSIS — Z87448 Personal history of other diseases of urinary system: Secondary | ICD-10-CM | POA: Diagnosis present

## 2017-08-13 DIAGNOSIS — E861 Hypovolemia: Secondary | ICD-10-CM | POA: Diagnosis present

## 2017-08-13 HISTORY — DX: Acute kidney failure, unspecified: N17.9

## 2017-08-13 LAB — URINALYSIS, ROUTINE W REFLEX MICROSCOPIC
Bacteria, UA: NONE SEEN
Bilirubin Urine: NEGATIVE
GLUCOSE, UA: NEGATIVE mg/dL
HGB URINE DIPSTICK: NEGATIVE
Ketones, ur: NEGATIVE mg/dL
LEUKOCYTES UA: NEGATIVE
NITRITE: NEGATIVE
Protein, ur: 30 mg/dL — AB
SPECIFIC GRAVITY, URINE: 1.016 (ref 1.005–1.030)
pH: 5 (ref 5.0–8.0)

## 2017-08-13 LAB — CBC
HCT: 46.2 % (ref 39.0–52.0)
Hemoglobin: 15.9 g/dL (ref 13.0–17.0)
MCH: 30.5 pg (ref 26.0–34.0)
MCHC: 34.4 g/dL (ref 30.0–36.0)
MCV: 88.5 fL (ref 78.0–100.0)
Platelets: 173 10*3/uL (ref 150–400)
RBC: 5.22 MIL/uL (ref 4.22–5.81)
RDW: 14.6 % (ref 11.5–15.5)
WBC: 14.2 10*3/uL — ABNORMAL HIGH (ref 4.0–10.5)

## 2017-08-13 LAB — COMPREHENSIVE METABOLIC PANEL
ALT: 56 U/L (ref 17–63)
AST: 31 U/L (ref 15–41)
Albumin: 3.5 g/dL (ref 3.5–5.0)
Alkaline Phosphatase: 40 U/L (ref 38–126)
Anion gap: 11 (ref 5–15)
BUN: 26 mg/dL — ABNORMAL HIGH (ref 6–20)
CO2: 24 mmol/L (ref 22–32)
Calcium: 9 mg/dL (ref 8.9–10.3)
Chloride: 100 mmol/L — ABNORMAL LOW (ref 101–111)
Creatinine, Ser: 1.97 mg/dL — ABNORMAL HIGH (ref 0.61–1.24)
GFR calc Af Amer: 41 mL/min — ABNORMAL LOW (ref >60)
GFR calc non Af Amer: 36 mL/min — ABNORMAL LOW (ref >60)
Glucose, Bld: 81 mg/dL (ref 65–99)
Potassium: 4.2 mmol/L (ref 3.5–5.1)
Sodium: 135 mmol/L (ref 135–145)
Total Bilirubin: 0.9 mg/dL (ref 0.3–1.2)
Total Protein: <3 g/dL — ABNORMAL LOW (ref 6.5–8.1)

## 2017-08-13 LAB — ECHOCARDIOGRAM COMPLETE
HEIGHTINCHES: 74 in
WEIGHTICAEL: 3777.8 [oz_av]

## 2017-08-13 LAB — HEPARIN LEVEL (UNFRACTIONATED)
HEPARIN UNFRACTIONATED: 0.12 [IU]/mL — AB (ref 0.30–0.70)
HEPARIN UNFRACTIONATED: 0.26 [IU]/mL — AB (ref 0.30–0.70)

## 2017-08-13 LAB — NA AND K (SODIUM & POTASSIUM), RAND UR
Potassium Urine: 93 mmol/L
Sodium, Ur: 44 mmol/L

## 2017-08-13 LAB — MRSA PCR SCREENING: MRSA BY PCR: NEGATIVE

## 2017-08-13 LAB — CREATININE, URINE, RANDOM: Creatinine, Urine: 211.98 mg/dL

## 2017-08-13 LAB — HIV ANTIBODY (ROUTINE TESTING W REFLEX): HIV Screen 4th Generation wRfx: NONREACTIVE

## 2017-08-13 MED ORDER — HEPARIN BOLUS VIA INFUSION
2500.0000 [IU] | Freq: Once | INTRAVENOUS | Status: AC
  Administered 2017-08-13: 2500 [IU] via INTRAVENOUS
  Filled 2017-08-13: qty 2500

## 2017-08-13 NOTE — Progress Notes (Signed)
Progress Note  Patient Name: [name] Date of Encounter: 08/13/2017  Primary Cardiologist: New, will be Dr Antoine PocheHochrein  Patient Profile     56 y.o. male w/ hx HTN, AF (previously on dabigatran but stopped due to fear of bleeding), COPD/active smoking, ?adrenal tumor, was admitted 09/05 with lightheadedness and tachycardia>>rapid afib, also hypovolemic.  Subjective   Feels tired today, not aware of afib, prefers rate control and outpt DCCV over TEE/DCCV  Inpatient Medications    Scheduled Meds: . mometasone-formoterol  2 puff Inhalation BID  . multivitamin with minerals  1 tablet Oral Daily   Continuous Infusions: . sodium chloride 125 mL/hr at 08/13/17 0600  . heparin 1,500 Units/hr (08/13/17 0600)   PRN Meds: acetaminophen, albuterol, ondansetron (ZOFRAN) IV   Vital Signs    Vitals:   08/13/17 0415 08/13/17 0430 08/13/17 0445 08/13/17 0625  BP: (!) 114/97 (!) 132/97 (!) 122/94 92/76  Pulse: (!) 104  99 90  Resp: 19 15 18  (!) 24  Temp:    98.5 F (36.9 C)  TempSrc:    Oral  SpO2: 97%  96% 95%  Weight:    236 lb 1.8 oz (107.1 kg)  Height:    6\' 2"  (1.88 m)    Intake/Output Summary (Last 24 hours) at 08/13/17 0810 Last data filed at 08/13/17 0600  Gross per 24 hour  Intake           842.22 ml  Output                0 ml  Net           842.22 ml   Filed Weights   08/12/17 1946 08/13/17 0043 08/13/17 0625  Weight: 260 lb (117.9 kg) 260 lb (117.9 kg) 236 lb 1.8 oz (107.1 kg)    Telemetry    Atrial fib, rate control improved overnight, rare PVCs - Personally Reviewed  ECG    09/04 atrial fib, HR 158 - Personally Reviewed  Physical Exam   General: Well developed, well nourished, male appearing in no acute distress. Head: Normocephalic, atraumatic.  Neck: Supple without bruits, JVD not elevated but difficult to assess 2nd body habitus. Lungs:  Resp regular and unlabored, few rales and squeaks. Heart: Irreg R&R, S1, S2, no S3, S4, or murmur; no  rub. Abdomen: Soft, non-tender, non-distended with normoactive bowel sounds. No hepatomegaly. No rebound/guarding. No obvious abdominal masses. Extremities: No clubbing, cyanosis, no edema. Distal pedal pulses are 2+ bilaterally. Neuro: Alert and oriented X 3. Moves all extremities spontaneously. Psych: Normal affect.  Labs    Hematology  Recent Labs Lab 08/12/17 2001 08/13/17 0329  WBC 17.2* 14.2*  RBC 5.87* 5.22  HGB 17.8* 15.9  HCT 51.5 46.2  MCV 87.7 88.5  MCH 30.3 30.5  MCHC 34.6 34.4  RDW 14.4 14.6  PLT 218 173    Chemistry  Recent Labs Lab 08/12/17 2001 08/13/17 0329  NA 135 135  K 4.1 4.2  CL 97* 100*  CO2 24 24  GLUCOSE 110* 81  BUN 22* 26*  CREATININE 2.63* 1.97*  CALCIUM 9.6 9.0  PROT 6.4* <3.0*  ALBUMIN 4.1 3.5  AST 54* 31  ALT 76* 56  ALKPHOS 45 40  BILITOT 1.5* 0.9  GFRNONAA 25* 36*  GFRAA 29* 41*  ANIONGAP 14 11     Cardiac Enzymes   Recent Labs Lab 08/12/17 2040  TROPIPOC 0.01     BNPNo results for input(s): BNP, PROBNP in the last 168 hours.  DDimer No results for input(s): DDIMER in the last 168 hours.   Radiology    Dg Chest Portable 1 View  Result Date: 08/12/2017 CLINICAL DATA:  Tachycardia EXAM: PORTABLE CHEST 1 VIEW COMPARISON:  11/18/2016 FINDINGS: The heart size and mediastinal contours are within normal limits. Both lungs are clear. The visualized skeletal structures are unremarkable. IMPRESSION: No active disease. Electronically Signed   By: [name] M.D.   On: 08/12/2017 20:49     Cardiac Studies   ECHO: 08/13/2017 will order  Patient Profile     56 y.o. male w/ hx HTN, AF (previously on dabigatran but stopped due to fear of bleeding), COPD/active smoking, ?adrenal tumor, was admitted 09/05 with lightheadedness and tachycardia>>rapid afib, also hypovolemic.   Assessment & Plan     Principal Problem: 1.  Persistent atrial fibrillation with rapid ventricular response (HCC) - not on rate control meds due  to low BP, continue to follow - HR has improved with hydration  Active Problems: 2.  Hypotension - improved w/ hydration, continue this - follow electrolytes  3.  Hypovolemia - see above  4.  ARF (acute renal failure) (HCC) - improving - renal function was normal 04/06/2017 ER visit  5. Anticoagulation - now on heparin - previously on Pradaxa, stopped because of bleeding fears - willing to take blood thinners now, discussed stroke risk - CHADS2VASC=1 (HTN) but pt without awareness of atrial fib when rate is relatively controlled.   Melida Quitter , PA-C 8:10 AM 08/13/2017 Pager: [phone]  History and all data above reviewed.  Patient examined.  I agree with the findings as above.  The patient feels better this morning.  Breathing OK.  No chest pain.  The patient exam reveals QMV:[id number]  ,  Lungs: Decreased breath sounds with few expiratory wheezes  ,  Abd: Positive bowel sounds, no rebound no guarding, Ext No edema  .  All available labs, radiology testing, previous records reviewed. Agree with documented assessment and plan. Atrial fib:  Rate is better.  I will continue to hydrate another 500 cc today and then oral hydration.  Ambulate.  Probably start DOAC in the AM pending his renal function and anticipate DCCV in 3 weeks.  Check echo today.    [name]  8:42 AM  08/13/2017

## 2017-08-13 NOTE — Progress Notes (Signed)
ANTICOAGULATION CONSULT NOTE - F/u Consult  Pharmacy Consult for heparin Indication: atrial fibrillation  No Known Allergies  Patient Measurements: Height: 6\' 2"  (188 cm) Weight: 260 lb (117.9 kg) IBW/kg (Calculated) : 82.2 Heparin Dosing Weight: 106 kg   Vital Signs: Temp: 98.6 F (37 C) (09/05 1947) Temp Source: Oral (09/05 1947) BP: 113/83 (09/06 0400) Pulse Rate: 87 (09/06 0400)  Labs:  Recent Labs  08/12/17 2001 08/13/17 0329  HGB 17.8* 15.9  HCT 51.5 46.2  PLT 218 173  HEPARINUNFRC  --  0.12*  CREATININE 2.63*  --     Estimated Creatinine Clearance: 41.8 mL/min (A) (by C-G formula based on SCr of 2.63 mg/dL (H)).   Medical History: Past Medical History:  Diagnosis Date  . Arthritis   . History of hiatal hernia   . Hypertension   . Lung disease    pt states based on breathing tests at work  . Pneumothorax   . Tuberculosis   . Urinary incontinence    Assessment: 56 YOM to start IV heparin for Afib. Per notes, patient believes he may have been on Pradaxa in the past, but was discontinued 2/2 nosebleed.  Heparin level subtherapeutic at 0.12 with no infusion issues per RN. CBC with slight downtrend, but still within normal limits. No s/s bleeding per RN.   Goal of Therapy:  Heparin level 0.3-0.7 units/ml Monitor platelets by anticoagulation protocol: Yes   Plan:  Heparin bolus 2500 units x1 Increase heparin gtt to 1500 units/hr  Heparin level in 6 hrs  Daily heparin level and CBC Monitor for s/s bleeding   York CeriseKatherine Cook, PharmD Clinical Pharmacist 08/13/17 4:37 AM

## 2017-08-13 NOTE — Progress Notes (Signed)
Echocardiogram completed at bedside.  IV Heparin gtt increased to 1700 units/hr per order.  Pt without c/o and HR controlled between mid 90s-low 100s.  Will continue to monitor.

## 2017-08-13 NOTE — Progress Notes (Signed)
ANTICOAGULATION CONSULT NOTE - F/u Consult  Pharmacy Consult for heparin Indication: atrial fibrillation  No Known Allergies  Patient Measurements: Height: 6\' 2"  (188 cm) Weight: 236 lb 1.8 oz (107.1 kg) IBW/kg (Calculated) : 82.2 Heparin Dosing Weight: 106 kg   Vital Signs: Temp: 98.5 F (36.9 C) (09/06 0625) Temp Source: Oral (09/06 0625) BP: 92/76 (09/06 0625) Pulse Rate: 90 (09/06 0625)  Labs:  Recent Labs  08/12/17 2001 08/13/17 0329 08/13/17 1057  HGB 17.8* 15.9  --   HCT 51.5 46.2  --   PLT 218 173  --   HEPARINUNFRC  --  0.12* 0.26*  CREATININE 2.63* 1.97*  --     Estimated Creatinine Clearance: 53.3 mL/min (A) (by C-G formula based on SCr of 1.97 mg/dL (H)).    Assessment: 56 YOM to start IV heparin for Afib. Per notes, patient believes he may have been on Pradaxa in the past, but was discontinued 2/2 nosebleed.  Heparin level = 0.26  Goal of Therapy:  Heparin level 0.3-0.7 units/ml Monitor platelets by anticoagulation protocol: Yes   Plan:  Heparin to 1700 units / hr Follow up AM labs  Thank you Okey RegalLisa Taziah Difatta, PharmD [phone] 08/13/17 11:46 AM

## 2017-08-13 NOTE — Progress Notes (Signed)
  Echocardiogram 2D Echocardiogram has been performed.  Leta JunglingCooper, Claude Waldman M 08/13/2017, 11:58 AM

## 2017-08-14 ENCOUNTER — Telehealth: Payer: Self-pay | Admitting: Physician Assistant

## 2017-08-14 DIAGNOSIS — I481 Persistent atrial fibrillation: Secondary | ICD-10-CM | POA: Diagnosis not present

## 2017-08-14 LAB — BASIC METABOLIC PANEL
Anion gap: 8 (ref 5–15)
BUN: 19 mg/dL (ref 6–20)
CHLORIDE: 105 mmol/L (ref 101–111)
CO2: 22 mmol/L (ref 22–32)
CREATININE: 1.01 mg/dL (ref 0.61–1.24)
Calcium: 9 mg/dL (ref 8.9–10.3)
GFR calc Af Amer: >60 mL/min (ref >60)
GFR calc non Af Amer: >60 mL/min (ref >60)
Glucose, Bld: 90 mg/dL (ref 65–99)
POTASSIUM: 3.5 mmol/L (ref 3.5–5.1)
Sodium: 135 mmol/L (ref 135–145)

## 2017-08-14 LAB — CBC
HEMATOCRIT: 43.3 % (ref 39.0–52.0)
HEMOGLOBIN: 14.2 g/dL (ref 13.0–17.0)
MCH: 29.4 pg (ref 26.0–34.0)
MCHC: 32.8 g/dL (ref 30.0–36.0)
MCV: 89.6 fL (ref 78.0–100.0)
Platelets: 155 10*3/uL (ref 150–400)
RBC: 4.83 MIL/uL (ref 4.22–5.81)
RDW: 14.4 % (ref 11.5–15.5)
WBC: 9.8 10*3/uL (ref 4.0–10.5)

## 2017-08-14 LAB — HEPARIN LEVEL (UNFRACTIONATED): Heparin Unfractionated: 0.27 IU/mL — ABNORMAL LOW (ref 0.30–0.70)

## 2017-08-14 MED ORDER — METOPROLOL SUCCINATE ER 25 MG PO TB24: 25.0000 mg | ORAL_TABLET | Freq: Two times a day (BID) | ORAL | 11 refills | Status: DC

## 2017-08-14 MED ORDER — HYDRALAZINE HCL 10 MG PO TABS: 10.0000 mg | ORAL_TABLET | Freq: Three times a day (TID) | ORAL | 3 refills | Status: DC

## 2017-08-14 MED ORDER — DABIGATRAN ETEXILATE MESYLATE 150 MG PO CAPS: 150.0000 mg | ORAL_CAPSULE | Freq: Two times a day (BID) | ORAL | 11 refills | Status: DC

## 2017-08-14 MED ORDER — AMLODIPINE BESYLATE 5 MG PO TABS: 5.0000 mg | ORAL_TABLET | Freq: Every day | ORAL | 6 refills | Status: DC

## 2017-08-14 MED ORDER — DABIGATRAN ETEXILATE MESYLATE 150 MG PO CAPS
150.0000 mg | ORAL_CAPSULE | Freq: Two times a day (BID) | ORAL | Status: DC
  Administered 2017-08-14: 150 mg via ORAL
  Filled 2017-08-14: qty 1

## 2017-08-14 NOTE — Care Management Note (Addendum)
Case Management Note  Patient Details  Name: Juan BannisterMartin Burel MRN: [id number] Date of Birth: [birth year]  Subjective/Objective: Pt presented for Persistent Atrial Fib. Plan for home on question Xarelto vs Eliquis. Benefits Check in process.                    Action/Plan: CM will make pt aware of cost once completed.   Expected Discharge Date:                  Expected Discharge Plan:  Home/Self Care  In-House Referral:  NA  Discharge planning Services  CM Consult, Medication Assistance  Post Acute Care Choice:  NA Choice offered to:  NA  DME Arranged:  N/A DME Agency:  NA  HH Arranged:  NA HH Agency:  NA  Status of Service:  Completed, signed off  If discussed at Long Length of Stay Meetings, dates discussed:    Additional Comments: 1237 9-7-18Tomi Bamberger- Robynn Marcel Graves-Bigelow, RN,BSN [phone] Pradaxa restarted. No further needs from CM at this time.   Per rep at [employer]:   Patient has a combined deductible of $5000 -- cannot tell me if patient has paid anything towards deductible   Xarelto: covered, copay at retail $83.43, no auth required   Eliquis: covered, copay at retail $83.42, no auth required Gala LewandowskyGraves-Bigelow, Josphine Laffey Kaye, RN 08/14/2017, 9:31 AM

## 2017-08-14 NOTE — Discharge Summary (Signed)
Discharge Summary    Patient ID: [name],  MRN: [id number], DOB/AGE: August 28, 1961 56 y.o.  Admit date: 08/12/2017 Discharge date: 08/14/2017  Primary Care Provider: Howard Pouch A Primary Cardiologist: Dr Percival Spanish  Discharge Diagnoses    Principal Problem:   Persistent atrial fibrillation with rapid ventricular response [facility]) Active Problems:   Hypotension   Hypovolemia   ARF (acute renal failure) ([place])   Allergies No Known Allergies  Diagnostic Studies/Procedures    ECHO: 08/13/2017  - Left ventricle: The cavity size was normal. Wall thickness was increased in a pattern of severe LVH. Systolic function was vigorous. The estimated ejection fraction was in the range of 65%to 70%. - Left atrium: The atrium was mildly dilated. - Right atrium: normal in size _____________   History of Present Illness     56 y.o. male w/ hx HTN, AF (previously on dabigatran but stopped due to fear of bleeding), COPD/active smoking, ?adrenal tumor, was admitted 09/05 with lightheadedness and tachycardia>>rapid afib, also hypovolemic and with ARF.  Hospital Course     Consultants: None   He was initially felt to be dehydrated with hypotension and ARF, BUN/Cr were 22/2.63. They had been normal at an 03/2017 ER visit. He was hydrated and his HR improved some with this. His renal function improved with hydration and had normalized by discharge. He is encouraged to maintain hydration as an outpatient.  His BP was low on admission, but improved with hydration. He was tachycardic, but because of the hypotension, no rate-lowering meds could be given. By discharge, his BP had improved and he was started on low-dose Toprol XL for rate control.   His BP meds were held on admission. He had not taken them for a long time, restarted them all the day of admission.   _____________  Discharge Vitals Blood pressure (!) 142/108, pulse (!) 119, temperature 98.7 F (37.1 C), temperature source Oral,  resp. rate 20, height '6\' 2"'  (1.88 m), weight 236 lb (107 kg), SpO2 100 %.  Filed Weights   08/13/17 0043 08/13/17 0625 08/14/17 0420  Weight: 260 lb (117.9 kg) 236 lb 1.8 oz (107.1 kg) 236 lb (107 kg)    Labs & Radiologic Studies    CBC  Recent Labs  08/13/17 0329 08/14/17 0351  WBC 14.2* 9.8  HGB 15.9 14.2  HCT 46.2 43.3  MCV 88.5 89.6  PLT 173 710   Basic Metabolic Panel BMP Latest Ref Rng & Units 08/14/2017 08/13/2017 08/12/2017  Glucose 65 - 99 mg/dL 90 81 110(H)  BUN 6 - 20 mg/dL 19 26(H) 22(H)  Creatinine 0.61 - 1.24 mg/dL 1.01 1.97(H) 2.63(H)  Sodium 135 - 145 mmol/L 135 135 135  Potassium 3.5 - 5.1 mmol/L 3.5 4.2 4.1  Chloride 101 - 111 mmol/L 105 100(L) 97(L)  CO2 22 - 32 mmol/L '22 24 24  ' Calcium 8.9 - 10.3 mg/dL 9.0 9.0 9.6   Liver Function Tests  Recent Labs  08/12/17 2001 08/13/17 0329  AST 54* 31  ALT 76* 56  ALKPHOS 45 40  BILITOT 1.5* 0.9  PROT 6.4* <3.0*  ALBUMIN 4.1 3.5    Cardiac Enzymes Recent Labs Lab 08/12/17 2040  TROPIPOC 0.01     _____________  Dg Chest Portable 1 View  Result Date: 08/12/2017 CLINICAL DATA:  Tachycardia EXAM: PORTABLE CHEST 1 VIEW COMPARISON:  11/18/2016 FINDINGS: The heart size and mediastinal contours are within normal limits. Both lungs are clear. The visualized skeletal structures are unremarkable. IMPRESSION: No active disease.  Electronically Signed   By: Franchot Gallo M.D.   On: 08/12/2017 20:49   Disposition   Pt is being discharged home today in good condition.  Follow-up Plans & Appointments    Follow-up Information    Barrett, Evelene Croon, PA-C Follow up on 08/25/2017.   Specialties:  Cardiology, Radiology Why:  Please arrive at 8:45 am for a 9:00 am appt.  Contact information: [address] [place] [address] [phone]          Discharge Instructions    Diet - low sodium heart healthy    Complete by:  As directed    Increase activity slowly    Complete by:  As directed        Discharge Medications   Current Discharge Medication List    START taking these medications   Details  dabigatran (PRADAXA) 150 MG CAPS capsule Take 1 capsule (150 mg total) by mouth every 12 (twelve) hours. Qty: 60 capsule, Refills: 11      CONTINUE these medications which have CHANGED   Details  metoprolol succinate (TOPROL-XL) 25 MG 24 hr tablet Take 1 tablet (25 mg total) by mouth 2 (two) times daily. Qty: 60 tablet, Refills: 11      CONTINUE these medications which have NOT CHANGED   Details  albuterol (PROVENTIL HFA;VENTOLIN HFA) 108 (90 Base) MCG/ACT inhaler Inhale 2 puffs into the lungs every 6 (six) hours as needed for wheezing or shortness of breath. Qty: 18 g, Refills: 0    budesonide-formoterol (SYMBICORT) 160-4.5 MCG/ACT inhaler Inhale 2 puffs into the lungs 2 (two) times daily. Needs office visit prior to anymore refills Qty: 1 Inhaler, Refills: 0   Associated Diagnoses: Mixed simple and mucopurulent chronic bronchitis (HCC)    ePHEDrine-GuaiFENesin (BRONKAID) 25-400 MG TABS Take 1-2 tablets by mouth every 6 (six) hours as needed (for shortness of breath/congestion).     psyllium (METAMUCIL SMOOTH TEXTURE) 28 % packet Take 1 packet by mouth daily as needed (for constipation).    Blood Pressure Monitoring (ADULT BLOOD PRESSURE CUFF LG) KIT 1 adult xl BP cuff kit. Qty: 1 each, Refills: 0   Associated Diagnoses: Essential hypertension      STOP taking these medications     Aspirin-Caffeine (BAYER BACK & BODY) 500-32.5 MG TABS      lisinopril-hydrochlorothiazide (PRINZIDE,ZESTORETIC) 20-25 MG tablet      amLODipine (NORVASC) 10 MG tablet      hydrALAZINE (APRESOLINE) 10 MG tablet      Multiple Vitamin (MULTIVITAMIN WITH MINERALS) TABS tablet            Outstanding Labs/Studies   None  Duration of Discharge Encounter   Greater than 30 minutes including physician time.  Jonetta Speak NP 08/14/2017, 2:43 PM  Patient seen and examined.   Plan as discussed in my rounding note for today and outlined above. Minus Breeding  08/14/2017  2:52 PM

## 2017-08-14 NOTE — Telephone Encounter (Signed)
TOC Pt-Please call Pt-Pt has an appointment on 08-25-17 with Theodore Demarkhonda Barrett

## 2017-08-14 NOTE — Progress Notes (Signed)
Progress Note  Patient Name: [name] Date of Encounter: 08/14/2017  Primary Cardiologist: New, will be Dr Antoine PocheHochrein  Patient Profile     56 y.o. male w/ hx HTN, AF (previously on dabigatran but stopped due to fear of bleeding), COPD/active smoking, ?adrenal tumor, was admitted 09/05 with lightheadedness and tachycardia>>rapid afib, also hypovolemic.    Subjective   Noticed that when he coughed, the monitor went nuts, no CP/SOB or palpitations w/ ambulation. 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 He wonders about going back on his previous BP meds.  Inpatient Medications    Scheduled Meds: . mometasone-formoterol  2 puff Inhalation BID  . multivitamin with minerals  1 tablet Oral Daily   Continuous Infusions: . heparin 1,700 Units/hr (08/14/17 0615)   PRN Meds: acetaminophen, albuterol, ondansetron (ZOFRAN) IV   Vital Signs    Vitals:   08/13/17 1600 08/13/17 1835 08/13/17 2021 08/14/17 0420  BP: 109/81 (!) 124/96 118/80 (!) 139/98  Pulse:   93 92  Resp:  (!) 21 17 14   Temp: 98.4 F (36.9 C)  98.3 F (36.8 C) 98 F (36.7 C)  TempSrc: Oral  Oral Oral  SpO2:   97% 96%  Weight:    236 lb (107 kg)  Height:        Intake/Output Summary (Last 24 hours) at 08/14/17 0956 Last data filed at 08/13/17 1800  Gross per 24 hour  Intake          1332.87 ml  Output                0 ml  Net          1332.87 ml   Filed Weights   08/13/17 0043 08/13/17 0625 08/14/17 0420  Weight: 260 lb (117.9 kg) 236 lb 1.8 oz (107.1 kg) 236 lb (107 kg)    Telemetry    Atrial fib, rate rapid much of the time, no sig ectopy - Personally Reviewed  ECG    09/04 atrial fib, HR 158 - Personally Reviewed  Physical Exam   General: Well developed, well nourished, male in no acute distress Head: Eyes PERRLA, No xanthomas.   Normocephalic and atraumatic  Lungs: Clear bilaterally to auscultation. Heart: Irreg R&R S1 S2, without MRG.   Pulses are 2+ & equal. No carotid bruit. No JVD. Abdomen: Bowel sounds are present, abdomen soft and non-tender without masses or  hernias noted. Msk: Normal strength and tone for age. Extremities: No clubbing, cyanosis or edema.    Skin:  No rashes or lesions noted. Neuro: Alert and oriented X 3. Psych:  Good affect, responds appropriately   Labs    Hematology  Recent Labs Lab 08/12/17 2001 08/13/17 0329 08/14/17 0351  WBC 17.2* 14.2* 9.8  RBC 5.87* 5.22 4.83  HGB 17.8* 15.9 14.2  HCT 51.5 46.2 43.3  MCV 87.7 88.5 89.6  MCH 30.3 30.5 29.4  MCHC 34.6 34.4 32.8  RDW 14.4 14.6 14.4  PLT 218 173 155    Chemistry  Recent Labs Lab 08/12/17 2001 08/13/17 0329 08/14/17 0351  NA 135 135 135  K 4.1 4.2 3.5  CL 97* 100* 105  CO2 24 24 22   GLUCOSE 110* 81 90  BUN 22* 26* 19  CREATININE 2.63* 1.97* 1.01  CALCIUM 9.6 9.0 9.0  PROT 6.4* <3.0*  --   ALBUMIN 4.1 3.5  --   AST 54* 31  --   ALT 76*  56  --   ALKPHOS 45 40  --   BILITOT 1.5* 0.9  --   GFRNONAA 25* 36* >60  GFRAA 29* 41* >60  ANIONGAP Cardiac Enzymes   Recent Labs Lab 08/12/17 2040  TROPIPOC 0.01     Radiology    Dg Chest Portable 1 View  Result Date: 08/12/2017 CLINICAL DATA:  Tachycardia EXAM: PORTABLE CHEST 1 VIEW COMPARISON:  11/18/2016 FINDINGS: The heart size and mediastinal contours are within normal limits. Both lungs are clear. The visualized skeletal structures are unremarkable. IMPRESSION: No active disease. Electronically Signed   By: [name] M.D.   On: 08/12/2017 20:49     Cardiac Studies   ECHO: 08/13/2017  - Left ventricle: The cavity size was normal. Wall thickness was   increased in a pattern of severe LVH. Systolic function was   vigorous. The estimated ejection fraction was in the range of 65%   to 70%. - Left atrium: The atrium was mildly dilated. - Right atrium: normal in size  Patient Profile     57 y.o. male w/ hx HTN, AF (previously on dabigatran  but stopped due to fear of bleeding), COPD/active smoking, ?adrenal tumor, was admitted 09/05 with lightheadedness and tachycardia>>rapid afib, also hypovolemic.   Assessment & Plan     Principal Problem: 1.  Persistent atrial fibrillation with rapid ventricular response (HCC) - hydration completed but HR still elevated most of the time - BP does not give much room. - MD advise if we can add Toprol XL 25 mg qd  Active Problems: 2.  Hypotension - improved, BP much lower than his usual - follow  3.  Hypovolemia - resolved, can d/c IVF  4.  ARF (acute renal failure) (HCC) - resolved - renal function now back to normal   5. Anticoagulation - currently on heparin - had been on Pradaxa, he d/c'd it due to fear of bleeding - restart it today - understands that we cannot get him back in SR w/out blood thinners - CHADS2VASC=1 (HTN)   Plan: Pradaxa restarted, decide on BB/CCB, d/c home later today  Melida Quitter , PA-C 9:56 AM 08/14/2017 Pager: [phone]  History and all data above reviewed.  Patient examined.  I agree with the findings as above.  He wants to go home.  No acute SOB or light headedness.  The patient exam reveals [id number]:[id number]  ,  Lungs: Clear  ,  Abd: Positive bowel sounds, no rebound no guarding, Ext No edema  .  All available labs, radiology testing, previous records reviewed. Agree with documented assessment and plan. Atrial fib:   Home on Pradaxa and Toprol XL 25 mg bid.  He needs TOC appt next week and to be off of work until that time.  Plan DCCV in 3 weeks.  AFR:  Resolved with hydration.    [name] [name]  12:34 PM  08/14/2017

## 2017-08-14 NOTE — Discharge Instructions (Signed)
Do not return to work until cleared by Cardiology.  Follow your BP at home, call if it is running over 140/90

## 2017-08-17 ENCOUNTER — Telehealth: Payer: Self-pay | Admitting: Physician Assistant

## 2017-08-17 NOTE — Telephone Encounter (Signed)
See telephone encounter from 08/14/17.

## 2017-08-17 NOTE — Telephone Encounter (Addendum)
[facility]  Patient understands to follow up with provider Theodore Demarkhonda Barrett, PA on 08/25/17 at 0900 [facility]. Patient understands discharge instructions? Yes Patient understands medications and regiment? Yes Patient understands to bring all medications to this visit? Yes  Patient has been taking his BP as directed. BP running 160s-180s/120, heart rate fluctuating around 115 bpm. All BP meds were discontinued upon discharge. Patient is taking Pradaxa 150 mg BID and Metoprolol 25 mg BID.  Asked patient to check BP on phone - BP-170/124, heart rate 116 bpm.   Patient reports he took his dog for a walk and went to [employer] yesterday - this wore him out.   Discussed with Theodore Demarkhonda Barrett, PA - okay to START Amlodipine 5 mg QD (patient already has script).  Left message for patient to call back.

## 2017-08-17 NOTE — Telephone Encounter (Signed)
New message    [facility] Please call.

## 2017-08-17 NOTE — Telephone Encounter (Signed)
Patient returned call. Patient advised of medication instructions. He will start the Amlodipine 5 mg QD and continue to monitor his BP and heart rates. Advised patient to bring his record to his follow-up appointment with RB, PA next week. Patient verbalized understanding and agreed with plan.

## 2017-08-25 ENCOUNTER — Encounter: Payer: Self-pay | Admitting: Physician Assistant

## 2017-08-25 ENCOUNTER — Ambulatory Visit (INDEPENDENT_AMBULATORY_CARE_PROVIDER_SITE_OTHER): Payer: BLUE CROSS/BLUE SHIELD | Admitting: Physician Assistant

## 2017-08-25 VITALS — BP 170/106 | HR 165 | Ht 74.0 in | Wt 233.4 lb

## 2017-08-25 DIAGNOSIS — Z7901 Long term (current) use of anticoagulants: Secondary | ICD-10-CM | POA: Diagnosis not present

## 2017-08-25 DIAGNOSIS — I4891 Unspecified atrial fibrillation: Secondary | ICD-10-CM | POA: Diagnosis not present

## 2017-08-25 DIAGNOSIS — I481 Persistent atrial fibrillation: Secondary | ICD-10-CM | POA: Diagnosis not present

## 2017-08-25 DIAGNOSIS — I4819 Other persistent atrial fibrillation: Secondary | ICD-10-CM

## 2017-08-25 DIAGNOSIS — I10 Essential (primary) hypertension: Secondary | ICD-10-CM | POA: Diagnosis not present

## 2017-08-25 MED ORDER — DILTIAZEM HCL ER COATED BEADS 180 MG PO CP24: 180.0000 mg | ORAL_CAPSULE | Freq: Every day | ORAL | 6 refills | Status: DC

## 2017-08-25 NOTE — Progress Notes (Signed)
Cardiology Office Note   Date:  08/25/2017   ID:  [name], DOB 02/09/1961, MRN [id number]  PCP:  Ma Hillock, DO  Cardiologist:  Dr. Percival Spanish, 08/14/2017 in hospital  Barrett, Suanne Marker, PA-C   No chief complaint on file.   History of Present Illness: [name] is a 56 y.o. male with a history of HTN, AF (previously on dabigatran but stopped due to fear of bleeding), COPD/active smoking, ?adrenal tumor.  [facility] admitted 09/05-09/06/2017 with lightheadedness and tachycardia>>rapid afib, also hypovolemic and with ARF. Renal function normalized with hydration and blood pressure improved, heart rate improved as well, low-dose beta blocker added. Lisinopril HCTZ 20/25 discontinued, amlodipine 10 mg discontinued and hydralazine 10 mg discontinued. Early follow-up and add blood pressure meds back as needed recommended 09/10 phone notes regarding high BP, amlodipine restarted at 5 mg.   [name] presents for cardiology follow up.  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 However, he was able to get in touch with HR, he has been taken out for a month.   He has been walking his dog every day, but does not get very far. He will get light-headed, weak and SOB. His HR at the house has been from 75>140. He has not missed any med doses, has been careful to take the Pradaxa twice a day. Most of the time, he has trouble feeling his pulse, so cannot count it easily.  He had chest pain when he tried to have sex, also gets chest tightness when he walks his dog. 8/10 at its worst prior to admission, 3/10 worst episode since d/c.   He had to park about 100 ft away from the elevators, he sat some in the waiting room, and then walked 75 feet to the room. In the exam room, his HR was 165, did not slow down much after he sat for a while.   He took his am meds over an hour ago.   Past Medical History:  Diagnosis Date  . ARF (acute renal failure) ([place]) 08/13/2017  .  Arthritis   . History of hiatal hernia   . Hypertension   . Lung disease    pt states based on breathing tests at work  . Pneumothorax   . Tuberculosis   . Urinary incontinence     Past Surgical History:  Procedure Laterality Date  . ADRENAL GLAND SURGERY     pt reports adrenal gland removed, he is uncertain which side.   Marland Kitchen CARDIAC CATHETERIZATION     pt describes a cardiac cath procedure as being completed, states it was "clear"  . ESOPHAGOGASTRODUODENOSCOPY     pt states it was abnormal, but he does not know why  . HERNIA REPAIR    . NASAL HEMORRHAGE CONTROL N/A 02/22/2016   Procedure: EPISTAXIS CONTROL;  Surgeon: Izora Gala, MD;  Location: [facility];  Service: ENT;  Laterality: N/A;    Current Outpatient Prescriptions  Medication Sig Dispense Refill  . albuterol (PROVENTIL HFA;VENTOLIN HFA) 108 (90 Base) MCG/ACT inhaler Inhale 2 puffs into the lungs every 6 (six) hours as needed for wheezing or shortness of breath. 18 g 0  . amLODipine (NORVASC) 5 MG tablet Take 5 mg by mouth daily.    . Blood Pressure Monitoring (ADULT BLOOD PRESSURE CUFF LG) KIT 1 adult xl BP cuff kit. 1 each 0  . budesonide-formoterol (SYMBICORT) 160-4.5 MCG/ACT inhaler Inhale 2 puffs into the lungs 2 (two) times daily.    Marland Kitchen  dabigatran (PRADAXA) 150 MG CAPS capsule Take 1 capsule (150 mg total) by mouth every 12 (twelve) hours. 60 capsule 11  . ePHEDrine-GuaiFENesin (BRONKAID) 25-400 MG TABS Take 1-2 tablets by mouth every 6 (six) hours as needed (for shortness of breath/congestion).     . metoprolol succinate (TOPROL-XL) 25 MG 24 hr tablet Take 1 tablet (25 mg total) by mouth 2 (two) times daily. 60 tablet 11   No current facility-administered medications for this visit.     Allergies:   Patient has no known allergies.    Social History:  The patient  reports that he has been smoking Cigarettes.  He has a 45.00 pack-year smoking history. He has never used smokeless tobacco. He reports that he drinks about  7.2 oz of alcohol per week . He reports that he does not use drugs.   Family History:  The patient's family history includes Alcohol abuse in his father; Arthritis in his brother and mother; Early death in his father; Heart disease in his brother; Mental illness in his father.    ROS:  Please see the history of present illness. All other systems are reviewed and negative.    PHYSICAL EXAM: VS:  BP (!) 170/106   Pulse (!) 165   Ht '6\' 2"'  (1.88 m)   Wt 233 lb 6.4 oz (105.9 kg)   BMI 29.97 kg/m  , BMI Body mass index is 29.97 kg/m. GEN: Well nourished, well developed, male in no acute distress  HEENT: normal for age  Neck: no JVD, no carotid bruit, no masses Cardiac: Rapid and irreg R&R; no murmur, no rubs, or gallops Respiratory:  Scattered rales bilaterally, normal work of breathing GI: soft, nontender, nondistended, + BS MS: no deformity or atrophy; no edema; distal pulses are 2+ in all 4 extremities   Skin: warm and dry, no rash Neuro:  Strength and sensation are intact Psych: euthymic mood, full affect   EKG:  EKG is ordered today. The ekg ordered today demonstrates atrial fib, HR 165, no acute ischemic changes. Rate is slightly higher than 09/05, otherwise, no change  ECHO: 08/13/2017  - Left ventricle: The cavity size was normal. Wall thickness was increased in a pattern of severe LVH. Systolic function was vigorous. The estimated ejection fraction was in the range of 65%to 70%. - Left atrium: The atrium was mildly dilated. - Right atrium: normal in size   Recent Labs: 08/13/2017: ALT 56 08/14/2017: BUN 19; Creatinine, Ser 1.01; Hemoglobin 14.2; Platelets 155; Potassium 3.5; Sodium 135    Lipid Panel No results found for: CHOL, TRIG, HDL, CHOLHDL, VLDL, LDLCALC, LDLDIRECT   Wt Readings from Last 3 Encounters:  08/25/17 233 lb 6.4 oz (105.9 kg)  08/14/17 236 lb (107 kg)  04/06/17 250 lb (113.4 kg)     Other studies Reviewed: Additional studies/ records that were  reviewed today include: Hospital records and testing.  ASSESSMENT AND PLAN: The patient was reviewed with Dr Oval Linsey who agrees  1.  Persistent atrial fib, rapid: He is very symptomatic from the atrial fib. He needs admission for rate control, with TEE/DCCV scheduled when available.   HOWEVER, the patient adamantly refuses this. He states he needs a couple of days to get social issues stabilized. His wife is present, she agrees with admission, but he still refuses.   When we attempted to schedule the TEE/DCCV as outpt, we were unable to get it until 09/25. I discussed this with the pt and his wife. Since he cannot get the  TEE/DCCV as an outpt until 09/25 and we can do the DCCV without the TEE on 09/27, will schedule just the DCCV. He understands that we are doing this right at 3 weeks and he must absolutely not miss a dose of Pradaxa between now and then. Pt understands to be NPO after midnight.  He should not drive or do anything strenuous. Limit activity as needed to keep the SOB and chest pain at a minimum. If he just can't tolerate it as an outpatient, he will be admitted for rate control in the TEE cardioversion will be scheduled as an inpatient.  2. Anticoag: Chads2vasc=1 (HTN). He is compliant with Pradaxa.  3. Chest pain - he is having exertional symptoms in the setting of a heart rate that is greatly elevated above normal. A stress tes or other ischemic eval t is indicated, but we cannot do this until his heart rate is better controlled. Discussed this with M.D. once he is back in sinus rhythm. A stress test seems preferable since he would not have to stop anticoagulation for this.  4. HTN: His blood pressure is elevated today but he is also under regular stress because of his heart rate. He was restarted on his amlodipine. We will stop the amlodipine and start Cardizem. The metoprolol can be up titrated as needed for better heart rate control.    Current medicines are reviewed at length  with the patient today.  The patient does not have concerns regarding medicines.  The following changes have been made:  Add Cardizem, stop amlodipine  Labs/ tests ordered today include:  No orders of the defined types were placed in this encounter.    Disposition:   FU with Dr. Percival Spanish  Signed, Lenoard Aden  08/25/2017 10:17 AM    [place] Phone: [phone]; Fax: [phone]  This note was written with the assistance of speech recognition software. Please excuse any transcriptional errors.

## 2017-08-25 NOTE — Patient Instructions (Signed)
Medication Instructions:  STOP Norvasc (Amlodopine) START Cardizem  Take 1 tablet once a day CONTINUE Metoprolol BUT contact office if your Systolic(top) number is under 161 or if your heart rate is under 60  Labwork: You will have labs before your procedure.   Testing/Procedures: Your physician has recommended that you have a Cardioversion (DCCV). Electrical Cardioversion uses a jolt of electricity to your heart either through paddles or wired patches attached to your chest. This is a controlled, usually prescheduled, procedure. Defibrillation is done under light anesthesia in the hospital, and you usually go home the day of the procedure. This is done to get your heart back into a normal rhythm. You are not awake for the procedure. Please see the instruction sheet given to you today.  Procedure will be done at [facility] you will need to go to Admissions at the [facility] with the valet parking.  Your procedure is scheduled for September 04, 2017 at 9:00am. You will need to be at the hospital by 7:15am.  Take all your meds the morning of your procedure. Nothing to eat or drink after midnight. You may take you meds in the morning with a few sips of water.   Follow-Up: Your physician recommends that you schedule a follow-up appointment in: After Cardioversion with Dr Antoine Poche.   Any Other Special Instructions Will Be Listed Below (If Applicable).  If you need a refill on your cardiac medications before your next appointment, please call your pharmacy.

## 2017-09-02 ENCOUNTER — Encounter: Payer: Self-pay | Admitting: Cardiology

## 2017-09-04 ENCOUNTER — Encounter (HOSPITAL_COMMUNITY)
Admission: RE | Disposition: A | Discharge status: Home / Self Care | Payer: Self-pay | Source: Ambulatory Visit | Attending: Cardiovascular Disease

## 2017-09-04 ENCOUNTER — Ambulatory Visit (HOSPITAL_COMMUNITY): Payer: BLUE CROSS/BLUE SHIELD | Admitting: Anesthesiology

## 2017-09-04 ENCOUNTER — Encounter (HOSPITAL_COMMUNITY): Payer: Self-pay | Admitting: *Deleted

## 2017-09-04 ENCOUNTER — Ambulatory Visit (HOSPITAL_COMMUNITY)
Admission: RE | Admit: 2017-09-04 | Discharge: 2017-09-04 | Disposition: A | Discharge status: Home / Self Care | Payer: BLUE CROSS/BLUE SHIELD | Source: Ambulatory Visit | Attending: Cardiovascular Disease | Admitting: Cardiovascular Disease

## 2017-09-04 DIAGNOSIS — J449 Chronic obstructive pulmonary disease, unspecified: Secondary | ICD-10-CM | POA: Diagnosis not present

## 2017-09-04 DIAGNOSIS — Z79899 Other long term (current) drug therapy: Secondary | ICD-10-CM | POA: Diagnosis not present

## 2017-09-04 DIAGNOSIS — Z87891 Personal history of nicotine dependence: Secondary | ICD-10-CM | POA: Insufficient documentation

## 2017-09-04 DIAGNOSIS — I4891 Unspecified atrial fibrillation: Secondary | ICD-10-CM | POA: Diagnosis not present

## 2017-09-04 DIAGNOSIS — I481 Persistent atrial fibrillation: Secondary | ICD-10-CM | POA: Diagnosis not present

## 2017-09-04 DIAGNOSIS — M1990 Unspecified osteoarthritis, unspecified site: Secondary | ICD-10-CM | POA: Diagnosis not present

## 2017-09-04 DIAGNOSIS — K449 Diaphragmatic hernia without obstruction or gangrene: Secondary | ICD-10-CM | POA: Diagnosis not present

## 2017-09-04 DIAGNOSIS — R000 Tachycardia, unspecified: Secondary | ICD-10-CM | POA: Diagnosis not present

## 2017-09-04 DIAGNOSIS — I480 Paroxysmal atrial fibrillation: Secondary | ICD-10-CM

## 2017-09-04 DIAGNOSIS — I10 Essential (primary) hypertension: Secondary | ICD-10-CM | POA: Diagnosis not present

## 2017-09-04 DIAGNOSIS — M545 Low back pain: Secondary | ICD-10-CM | POA: Diagnosis not present

## 2017-09-04 DIAGNOSIS — N179 Acute kidney failure, unspecified: Secondary | ICD-10-CM | POA: Diagnosis not present

## 2017-09-04 DIAGNOSIS — E861 Hypovolemia: Secondary | ICD-10-CM | POA: Diagnosis not present

## 2017-09-04 HISTORY — PX: CARDIOVERSION: SHX1299

## 2017-09-04 SURGERY — CARDIOVERSION
Anesthesia: General

## 2017-09-04 MED ORDER — LIDOCAINE HCL (CARDIAC) 20 MG/ML IV SOLN
INTRAVENOUS | Status: DC | PRN
  Administered 2017-09-04: 80 mg via INTRATRACHEAL

## 2017-09-04 MED ORDER — HYDRALAZINE HCL 20 MG/ML IJ SOLN
INTRAMUSCULAR | Status: AC
  Filled 2017-09-04: qty 1

## 2017-09-04 MED ORDER — HYDRALAZINE HCL 20 MG/ML IJ SOLN
10.0000 mg | Freq: Once | INTRAMUSCULAR | Status: AC
  Administered 2017-09-04: 10 mg via INTRAVENOUS

## 2017-09-04 MED ORDER — PROPOFOL 10 MG/ML IV BOLUS
INTRAVENOUS | Status: DC | PRN
  Administered 2017-09-04: 80 mg via INTRAVENOUS

## 2017-09-04 MED ORDER — LACTATED RINGERS IV SOLN: INTRAVENOUS | Status: DC | PRN

## 2017-09-04 MED ORDER — SODIUM CHLORIDE 0.9 % IV SOLN
INTRAVENOUS | Status: DC | PRN
  Administered 2017-09-04: 08:00:00 via INTRAVENOUS

## 2017-09-04 NOTE — Interval H&P Note (Signed)
History and Physical Interval Note:  09/04/2017 7:59 AM  [name]  has presented today for surgery, with the diagnosis of afib  The various methods of treatment have been discussed with the patient and family. After consideration of risks, benefits and other options for treatment, the patient has consented to  Procedure(s): CARDIOVERSION (N/A) as a surgical intervention .  The patient's history has been reviewed, patient examined, no change in status, stable for surgery.  I have reviewed the patient's chart and labs.  Questions were answered to the patient's satisfaction.     Charlton Haws

## 2017-09-04 NOTE — H&P (View-Only) (Signed)
Progress Note  Patient Name: [name] Date of Encounter: 08/14/2017  Primary Cardiologist: New, will be Dr Antoine PocheHochrein  Patient Profile     56 y.o. male w/ hx HTN, AF (previously on dabigatran but stopped due to fear of bleeding), COPD/active smoking, ?adrenal tumor, was admitted 09/05 with lightheadedness and tachycardia>>rapid afib, also hypovolemic.    Subjective   Noticed that when he coughed, the monitor went nuts, no CP/SOB or palpitations w/ ambulation. 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 He wonders about going back on his previous BP meds.  Inpatient Medications    Scheduled Meds: . mometasone-formoterol  2 puff Inhalation BID  . multivitamin with minerals  1 tablet Oral Daily   Continuous Infusions: . heparin 1,700 Units/hr (08/14/17 0615)   PRN Meds: acetaminophen, albuterol, ondansetron (ZOFRAN) IV   Vital Signs    Vitals:   08/13/17 1600 08/13/17 1835 08/13/17 2021 08/14/17 0420  BP: 109/81 (!) 124/96 118/80 (!) 139/98  Pulse:   93 92  Resp:  (!) 21 17 14   Temp: 98.4 F (36.9 C)  98.3 F (36.8 C) 98 F (36.7 C)  TempSrc: Oral  Oral Oral  SpO2:   97% 96%  Weight:    236 lb (107 kg)  Height:        Intake/Output Summary (Last 24 hours) at 08/14/17 0956 Last data filed at 08/13/17 1800  Gross per 24 hour  Intake          1332.87 ml  Output                0 ml  Net          1332.87 ml   Filed Weights   08/13/17 0043 08/13/17 0625 08/14/17 0420  Weight: 260 lb (117.9 kg) 236 lb 1.8 oz (107.1 kg) 236 lb (107 kg)    Telemetry    Atrial fib, rate rapid much of the time, no sig ectopy - Personally Reviewed  ECG    09/04 atrial fib, HR 158 - Personally Reviewed  Physical Exam   General: Well developed, well nourished, male in no acute distress Head: Eyes PERRLA, No xanthomas.   Normocephalic and atraumatic  Lungs: Clear bilaterally to auscultation. Heart: Irreg R&R S1 S2, without MRG.   Pulses are 2+ & equal. No carotid bruit. No JVD. Abdomen: Bowel sounds are present, abdomen soft and non-tender without masses or  hernias noted. Msk: Normal strength and tone for age. Extremities: No clubbing, cyanosis or edema.    Skin:  No rashes or lesions noted. Neuro: Alert and oriented X 3. Psych:  Good affect, responds appropriately   Labs    Hematology  Recent Labs Lab 08/12/17 2001 08/13/17 0329 08/14/17 0351  WBC 17.2* 14.2* 9.8  RBC 5.87* 5.22 4.83  HGB 17.8* 15.9 14.2  HCT 51.5 46.2 43.3  MCV 87.7 88.5 89.6  MCH 30.3 30.5 29.4  MCHC 34.6 34.4 32.8  RDW 14.4 14.6 14.4  PLT 218 173 155    Chemistry  Recent Labs Lab 08/12/17 2001 08/13/17 0329 08/14/17 0351  NA 135 135 135  K 4.1 4.2 3.5  CL 97* 100* 105  CO2 24 24 22   GLUCOSE 110* 81 90  BUN 22* 26* 19  CREATININE 2.63* 1.97* 1.01  CALCIUM 9.6 9.0 9.0  PROT 6.4* <3.0*  --   ALBUMIN 4.1 3.5  --   AST 54* 31  --   ALT 76*  56  --   ALKPHOS 45 40  --   BILITOT 1.5* 0.9  --   GFRNONAA 25* 36* >60  GFRAA 29* 41* >60  ANIONGAP 14 11 8     Cardiac Enzymes   Recent Labs Lab 08/12/17 2040  TROPIPOC 0.01     Radiology    Dg Chest Portable 1 View  Result Date: 08/12/2017 CLINICAL DATA:  Tachycardia EXAM: PORTABLE CHEST 1 VIEW COMPARISON:  11/18/2016 FINDINGS: The heart size and mediastinal contours are within normal limits. Both lungs are clear. The visualized skeletal structures are unremarkable. IMPRESSION: No active disease. Electronically Signed   By: Charles  Clark M.D.   On: 08/12/2017 20:49     Cardiac Studies   ECHO: 08/13/2017  - Left ventricle: The cavity size was normal. Wall thickness was   increased in a pattern of severe LVH. Systolic function was   vigorous. The estimated ejection fraction was in the range of 65%   to 70%. - Left atrium: The atrium was mildly dilated. - Right atrium: normal in size  Patient Profile     56 y.o. male w/ hx HTN, AF (previously on dabigatran  but stopped due to fear of bleeding), COPD/active smoking, ?adrenal tumor, was admitted 09/05 with lightheadedness and tachycardia>>rapid afib, also hypovolemic.   Assessment & Plan     Principal Problem: 1.  Persistent atrial fibrillation with rapid ventricular response (HCC) - hydration completed but HR still elevated most of the time - BP does not give much room. - MD advise if we can add Toprol XL 25 mg qd  Active Problems: 2.  Hypotension - improved, BP much lower than his usual - follow  3.  Hypovolemia - resolved, can d/c IVF  4.  ARF (acute renal failure) (HCC) - resolved - renal function now back to normal   5. Anticoagulation - currently on heparin - had been on Pradaxa, he d/c'd it due to fear of bleeding - restart it today - understands that we cannot get him back in SR w/out blood thinners - CHADS2VASC=1 (HTN)   Plan: Pradaxa restarted, decide on BB/CCB, d/c home later today  Signed, Barrett, Rhonda , PA-C 9:56 AM 08/14/2017 Pager: [phone]  History and all data above reviewed.  Patient examined.  I agree with the findings as above.  He wants to go home.  No acute SOB or light headedness.  The patient exam reveals COR:Irregular  ,  Lungs: Clear  ,  Abd: Positive bowel sounds, no rebound no guarding, Ext No edema  .  All available labs, radiology testing, previous records reviewed. Agree with documented assessment and plan. Atrial fib:   Home on Pradaxa and Toprol XL 25 mg bid.  He needs TOC appt next week and to be off of work until that time.  Plan DCCV in 3 weeks.  AFR:  Resolved with hydration.    [name]  12:34 PM  08/14/2017  

## 2017-09-04 NOTE — Progress Notes (Signed)
Call placed to Dr. Eden Emms to inform him BP unchanged with IV hydralazine. Per Dr. Eden Emms ok to d/c to home, advised patient to continue home medications as ordered and will follow up with cardiology as ordered.

## 2017-09-04 NOTE — Transfer of Care (Signed)
Immediate Anesthesia Transfer of Care Note  Patient: [name]  Procedure(s) Performed: Procedure(s): CARDIOVERSION (N/A)  Patient Location: PACU  Anesthesia Type:General  Level of Consciousness: awake, alert  and oriented  Airway & Oxygen Therapy: Patient Spontanous Breathing and Patient connected to nasal cannula oxygen  Post-op Assessment: Report given to RN, Post -op Vital signs reviewed and stable and Patient moving all extremities X 4  Post vital signs: Reviewed and stable  Last Vitals:  Vitals:   09/04/17 0754 09/04/17 0826  BP: (!) 175/128 (!) 179/129  Pulse:  97  Resp: 15 (!) 21  Temp: 36.8 C   SpO2: 97% 94%    Last Pain:  Vitals:   09/04/17 0754  TempSrc: Oral         Complications: No apparent anesthesia complications

## 2017-09-04 NOTE — Discharge Instructions (Signed)
Electrical Cardioversion, Care After °This sheet gives you information about how to care for yourself after your procedure. Your health care provider may also give you more specific instructions. If you have problems or questions, contact your health care provider. °What can I expect after the procedure? °After the procedure, it is common to have: °· Some redness on the skin where the shocks were given. ° °Follow these instructions at home: °· Do not drive for 24 hours if you were given a medicine to help you relax (sedative). °· Take over-the-counter and prescription medicines only as told by your health care provider. °· Ask your health care provider how to check your pulse. Check it often. °· Rest for 48 hours after the procedure or as told by your health care provider. °· Avoid or limit your caffeine use as told by your health care provider. °Contact a health care provider if: °· You feel like your heart is beating too quickly or your pulse is not regular. °· You have a serious muscle cramp that does not go away. °Get help right away if: °· You have discomfort in your chest. °· You are dizzy or you feel faint. °· You have trouble breathing or you are short of breath. °· Your speech is slurred. °· You have trouble moving an arm or leg on one side of your body. °· Your fingers or toes turn cold or blue. °This information is not intended to replace advice given to you by your health care provider. Make sure you discuss any questions you have with your health care provider. °Document Released: 09/14/2013 Document Revised: 06/27/2016 Document Reviewed: 05/30/2016 °Elsevier Interactive Patient Education © 2018 [employer] ° °

## 2017-09-04 NOTE — CV Procedure (Signed)
DCC: Anesthesia Dr Devra Dopp propofol  Shock x 1 150 J converted from afib rate 110 to NSR rate 78 No immediate neurologic sequelae On Rx DOAC with no missed doses  [name]   Note after 15 minutes prior to d/c patient had coughing spell and reverted to afib Will need AAT.   [name]

## 2017-09-04 NOTE — Anesthesia Postprocedure Evaluation (Signed)
Anesthesia Post Note  Patient: [name]  Procedure(s) Performed: Procedure(s) (LRB): CARDIOVERSION (N/A)     Patient location during evaluation: PACU Anesthesia Type: General Level of consciousness: awake and alert Pain management: pain level controlled Vital Signs Assessment: post-procedure vital signs reviewed and stable Respiratory status: spontaneous breathing, nonlabored ventilation, respiratory function stable and patient connected to nasal cannula oxygen Cardiovascular status: blood pressure returned to baseline and stable Postop Assessment: no apparent nausea or vomiting Anesthetic complications: no    Last Vitals:  Vitals:   09/04/17 0754 09/04/17 0826  BP: (!) 175/128 (!) 179/129  Pulse:  97  Resp: 15 (!) 21  Temp: 36.8 C   SpO2: 97% 94%    Last Pain:  Vitals:   09/04/17 0754  TempSrc: Oral                 Makaylen Thieme

## 2017-09-04 NOTE — Anesthesia Preprocedure Evaluation (Addendum)
Anesthesia Evaluation  Patient identified by MRN, date of birth, ID band Patient awake  General Assessment Comment:Emergency surgery.  Reviewed: Allergy & Precautions, NPO status , Patient's Chart, lab work & pertinent test results  Airway Mallampati: I  TM Distance: >3 FB Neck ROM: Full    Dental  (+) Edentulous Upper, Poor Dentition, Upper Dentures   Pulmonary Current Smoker,    breath sounds clear to auscultation       Cardiovascular hypertension, + dysrhythmias Atrial Fibrillation  Rhythm:Regular Rate:Normal     Neuro/Psych    GI/Hepatic negative GI ROS, Neg liver ROS, hiatal hernia,   Endo/Other  negative endocrine ROS  Renal/GU ARFRenal diseasenegative Renal ROS     Musculoskeletal  (+) Arthritis , Osteoarthritis,    Abdominal   Peds  Hematology   Anesthesia Other Findings Echo 9/18 Study Conclusions  - Left ventricle: The cavity size was normal. Wall thickness was   increased in a pattern of severe LVH. Systolic function was   vigorous. The estimated ejection fraction was in the range of 65%   to 70%. - Left atrium: The atrium was mildly dilated.   Reproductive/Obstetrics                            Anesthesia Physical  Anesthesia Plan  ASA: III  Anesthesia Plan: General   Post-op Pain Management:    Induction: Intravenous  PONV Risk Score and Plan: 0 and Treatment may vary due to age or medical condition  Airway Management Planned: Nasal Cannula, Natural Airway and Simple Face Mask  Additional Equipment:   Intra-op Plan:   Post-operative Plan:   Informed Consent: I have reviewed the patients History and Physical, chart, labs and discussed the procedure including the risks, benefits and alternatives for the proposed anesthesia with the patient or authorized representative who has indicated his/her understanding and acceptance.   Dental advisory given  Plan  Discussed with: CRNA, Anesthesiologist and Surgeon  Anesthesia Plan Comments:        Anesthesia Quick Evaluation

## 2017-09-04 NOTE — Progress Notes (Signed)
Call placed to Dr. Eden Emms, reports patient back in atrial fibrillation post cardioversion to sinus rhythm. BP 169/118, new order received for IV hydralazine  x1 dose. Can d/c following administration.

## 2017-09-06 ENCOUNTER — Encounter (HOSPITAL_COMMUNITY): Payer: Self-pay | Admitting: Cardiovascular Disease

## 2017-09-10 NOTE — Progress Notes (Signed)
Cardiology Office Note   Date:  09/13/2017   ID:  [name], DOB [birth year], MRN [id number]  PCP:  Ma Hillock, DO  Cardiologist:   Minus Breeding, MD    Chief Complaint  Patient presents with  . Atrial Fibrillation      History of Present Illness: [name] is a 56 y.o. male who presents for AF.  He was on Pradaxa but stopped because of fear of bleeding.  [facility] admitted on 09/05-09/06/2017 with lightheadedness with rapid afib, also hypovolemic and with ARF. Renal function normalized with hydration and blood pressure improved, heart rate improved as well.  Low dose beta blocker was added. Lisinopril HCTZ 20/25 was discontinued, amlodipine 10 mg was discontinued and hydralazine 10 mg was discontinued.   At the last visit he had atrial fib with rapid ventricular rate.  He subsequently had DCCV.  He returns for follow up.     He only stayed in NSR for about 5 minutes.  He returns today and he is hypertensive and tachycardic.  He says that he had a fight with his wife last night and he drank a 12 pack of beer.  He is now hung over.  He does not feel well.  He always knows that his heart is racing.  The patient denies any new symptoms such as chest discomfort, neck or arm discomfort. There has been no new shortness of breath, PND or orthopnea. There have been no reported palpitations, presyncope or syncope.  He says that he did take his meds.     Past Medical History:  Diagnosis Date  . ARF (acute renal failure) ([place]) 08/13/2017  . Arthritis   . History of hiatal hernia   . Hypertension   . Lung disease    pt states based on breathing tests at work  . Persistent atrial fibrillation with rapid ventricular response ([place]) 08/12/2017  . Pneumothorax   . Tobacco use 1973  . Tuberculosis   . Urinary incontinence     Past Surgical History:  Procedure Laterality Date  . ADRENAL GLAND SURGERY     pt reports adrenal gland removed, he is uncertain which side.   Marland Kitchen CARDIAC  CATHETERIZATION     pt describes a cardiac cath procedure as being completed, states it was "clear"  . CARDIOVERSION N/A 09/04/2017   Procedure: CARDIOVERSION;  Surgeon: Josue Hector, MD;  Location: [facility] ENDOSCOPY;  Service: Cardiovascular;  Laterality: N/A;  . ESOPHAGOGASTRODUODENOSCOPY     pt states it was abnormal, but he does not know why  . HERNIA REPAIR    . NASAL HEMORRHAGE CONTROL N/A 02/22/2016   Procedure: EPISTAXIS CONTROL;  Surgeon: Izora Gala, MD;  Location: [place];  Service: ENT;  Laterality: N/A;     Current Outpatient Prescriptions  Medication Sig Dispense Refill  . albuterol (PROVENTIL HFA;VENTOLIN HFA) 108 (90 Base) MCG/ACT inhaler Inhale 2 puffs into the lungs every 6 (six) hours as needed for wheezing or shortness of breath. 18 g 0  . Blood Pressure Monitoring (ADULT BLOOD PRESSURE CUFF LG) KIT 1 adult xl BP cuff kit. 1 each 0  . budesonide-formoterol (SYMBICORT) 160-4.5 MCG/ACT inhaler Inhale 2 puffs into the lungs 2 (two) times daily.    . dabigatran (PRADAXA) 150 MG CAPS capsule Take 150 mg by mouth 2 (two) times daily.    Marland Kitchen diltiazem (CARDIZEM CD) 180 MG 24 hr capsule Take 1 capsule (180 mg total) by mouth daily. 30 capsule 6  . ePHEDrine-GuaiFENesin (BRONKAID) 25-400 MG  TABS Take 1-2 tablets by mouth every 6 (six) hours as needed (for shortness of breath/congestion).     . metoprolol succinate (TOPROL-XL) 50 MG 24 hr tablet Take 1 tablet (50 mg total) by mouth 2 (two) times daily. 60 tablet 11  . lisinopril (PRINIVIL,ZESTRIL) 5 MG tablet Take 1 tablet (5 mg total) by mouth daily. 30 tablet 11   No current facility-administered medications for this visit.     Allergies:   Patient has no known allergies.    ROS:  Please see the history of present illness.   Otherwise, review of systems are positive for none.   All other systems are reviewed and negative.    PHYSICAL EXAM: VS:  BP (!) 184/128   Pulse (!) 143   Ht _0  (1.88 m)   Wt 244 lb (110.7 kg)   BMI  31.33 kg/m  , BMI Body mass index is 31.33 kg/m. GENERAL:  Well appearing HEENT:  Pupils equal round and reactive, fundi not visualized, oral mucosa unremarkable NECK:  No jugular venous distention, waveform within normal limits, carotid upstroke brisk and symmetric, no bruits, no thyromegaly LYMPHATICS:  No cervical, inguinal adenopathy LUNGS:  Clear to auscultation bilaterally BACK:  No CVA tenderness CHEST:  Unremarkable HEART:  PMI not displaced or sustained,S1 and S2 within normal limits, no S3, no clicks, no rubs, no murmurs, irregular ABD:  Flat, positive bowel sounds normal in frequency in pitch, no bruits, no rebound, no guarding, no midline pulsatile mass, no hepatomegaly, no splenomegaly EXT:  2 plus pulses throughout, no edema, no cyanosis no clubbing SKIN:  No rashes no nodules NEURO:  Cranial nerves II through XII grossly intact, motor grossly intact throughout PSYCH:  Cognitively intact, oriented to person place and time    EKG:  EKG is ordered today. The ekg ordered today demonstrates atrial fibrillation, rate 143, low voltage, axis within normal limits, no acute ST-T wave changes.   Recent Labs: 08/13/2017: ALT 56 08/14/2017: BUN 19; Creatinine, Ser 1.01; Hemoglobin 14.2; Platelets 155; Potassium 3.5; Sodium 135    Lipid Panel No results found for: CHOL, TRIG, HDL, CHOLHDL, VLDL, LDLCALC, LDLDIRECT    Wt Readings from Last 3 Encounters:  09/11/17 244 lb (110.7 kg)  09/04/17 233 lb (105.7 kg)  08/25/17 233 lb 6.4 oz (105.9 kg)      Other studies Reviewed: Additional studies/ records that were reviewed today include: Hospital cardioversion records. . Review of the above records demonstrates:  Please see elsewhere in the note.     ASSESSMENT AND PLAN:    ATRIAL FIB:  His rate is elevated.  He does not want to be hospitalized.  I am going to increase his beta blocker and see him back on Monday for BP and HR check.  He reports that prior to drinking excessively  last night his BP and HR were better controlled.    HTN:  I suggested that he should let me admit him.  He did not want to do this.   We gave him clonidine 0.1 mg 2 and his blood pressure came down slightly but still elevated.  He prefers to not be admitted.  I will increase the beta blocker and I will add lisinopril 5 mg daily.  He will keep his BP at home.  He says that it has been 140/80 most of the time.   I will check a 24 hour urine for VMA/ and  metanephrines.   ETOH:  He is advised not to drink.  Current medicines are reviewed at length with the patient today.  The patient does not have concerns regarding medicines.  The following changes have been made:  no change  Labs/ tests ordered today include: As above  Orders Placed This Encounter  Procedures  . Metanephrines, Urine, 24 hour  . Vanillylmandelic Acid, 84-CR U  . EKG 12-Lead     Disposition:   FU with nurse visit on Monday.  I will see him if he needs further med adjustment.      Signed, Minus Breeding, MD  09/13/2017 12:27 PM    [facility]

## 2017-09-11 ENCOUNTER — Encounter: Payer: Self-pay | Admitting: Cardiology

## 2017-09-11 ENCOUNTER — Ambulatory Visit (INDEPENDENT_AMBULATORY_CARE_PROVIDER_SITE_OTHER): Payer: BLUE CROSS/BLUE SHIELD | Admitting: Cardiology

## 2017-09-11 VITALS — BP 184/128 | HR 143 | Ht 74.0 in | Wt 244.0 lb

## 2017-09-11 DIAGNOSIS — I10 Essential (primary) hypertension: Secondary | ICD-10-CM

## 2017-09-11 DIAGNOSIS — R825 Elevated urine levels of drugs, medicaments and biological substances: Secondary | ICD-10-CM

## 2017-09-11 DIAGNOSIS — I481 Persistent atrial fibrillation: Secondary | ICD-10-CM

## 2017-09-11 DIAGNOSIS — I159 Secondary hypertension, unspecified: Secondary | ICD-10-CM | POA: Diagnosis not present

## 2017-09-11 DIAGNOSIS — I4819 Other persistent atrial fibrillation: Secondary | ICD-10-CM

## 2017-09-11 MED ORDER — LISINOPRIL 5 MG PO TABS: 5.0000 mg | ORAL_TABLET | Freq: Every day | ORAL | 11 refills | Status: DC

## 2017-09-11 MED ORDER — CLONIDINE HCL 0.1 MG PO TABS
0.1000 mg | ORAL_TABLET | Freq: Once | ORAL | Status: AC
  Administered 2017-09-11: 0.1 mg via ORAL

## 2017-09-11 MED ORDER — CLONIDINE HCL 0.1 MG PO TABS
0.1000 mg | ORAL_TABLET | Freq: Once | ORAL | Status: AC
Start: 2017-09-11 — End: 2017-09-11
  Administered 2017-09-11: 0.1 mg via ORAL

## 2017-09-11 MED ORDER — METOPROLOL SUCCINATE ER 50 MG PO TB24: 50.0000 mg | ORAL_TABLET | Freq: Two times a day (BID) | ORAL | 11 refills | Status: DC

## 2017-09-11 NOTE — Patient Instructions (Signed)
Medication Instructions:  INCREASE- Metoprolol Succinate 50 mg twice a day START- Lisinopril 5 mg daily  If you need a refill on your cardiac medications before your next appointment, please call your pharmacy.  Labwork: None Ordered   Testing/Procedures: None Ordered  Follow-Up: Your physician wants you to follow-up in: Monday for Nurse Visit Blood Pressure Check and Heart rate.    Thank you for choosing [facility] at [facility]!!

## 2017-09-13 ENCOUNTER — Encounter: Payer: Self-pay | Admitting: Cardiology

## 2017-09-14 ENCOUNTER — Ambulatory Visit (INDEPENDENT_AMBULATORY_CARE_PROVIDER_SITE_OTHER): Payer: BLUE CROSS/BLUE SHIELD | Admitting: *Deleted

## 2017-09-14 ENCOUNTER — Encounter: Payer: Self-pay | Admitting: Cardiology

## 2017-09-14 VITALS — BP 175/119 | HR 97

## 2017-09-14 DIAGNOSIS — R825 Elevated urine levels of drugs, medicaments and biological substances: Secondary | ICD-10-CM | POA: Diagnosis not present

## 2017-09-14 DIAGNOSIS — I10 Essential (primary) hypertension: Secondary | ICD-10-CM | POA: Diagnosis not present

## 2017-09-14 DIAGNOSIS — R7989 Other specified abnormal findings of blood chemistry: Secondary | ICD-10-CM | POA: Diagnosis not present

## 2017-09-14 MED ORDER — METOPROLOL SUCCINATE ER 25 MG PO TB24: 75.0000 mg | ORAL_TABLET | Freq: Two times a day (BID) | ORAL | 11 refills | Status: DC

## 2017-09-14 NOTE — Patient Instructions (Signed)
Your physician has recommended you make the following change in your medication:  INCREASE- Metoprolol 75 mg twice a day   Your physician recommends that you schedule a follow-up appointment in: 1 Week for Blood Pressure check and Heart Rate. 

## 2017-09-16 NOTE — Progress Notes (Signed)
Your physician has recommended you make the following change in your medication:  INCREASE- Metoprolol 75 mg twice a day   Your physician recommends that you schedule a follow-up appointment in: 1 Week for Blood Pressure check and Heart Rate.

## 2017-09-17 LAB — METANEPHRINES, URINE, 24 HOUR
Metaneph Total, Ur: 149 ug/L
Metanephrines, 24H Ur: 171 ug/24 hr (ref 45–290)
NORMETANEPHRINE 24H UR: 400 ug/(24.h) (ref 82–500)
NORMETANEPHRINE UR: 348 ug/L

## 2017-09-19 LAB — VANILLYLMANDELIC ACID, 24-HR U
VMA, 24H Ur Adult: 3.1 mg/24 hr (ref 0.0–7.5)
VMA, Urine: 2.7 mg/L

## 2017-09-21 ENCOUNTER — Ambulatory Visit (INDEPENDENT_AMBULATORY_CARE_PROVIDER_SITE_OTHER): Payer: BLUE CROSS/BLUE SHIELD | Admitting: Pharmacist

## 2017-09-21 ENCOUNTER — Telehealth: Payer: Self-pay | Admitting: *Deleted

## 2017-09-21 ENCOUNTER — Encounter: Payer: Self-pay | Admitting: Pharmacist

## 2017-09-21 ENCOUNTER — Encounter: Payer: BLUE CROSS/BLUE SHIELD | Admitting: Pharmacist

## 2017-09-21 VITALS — BP 160/102 | HR 85

## 2017-09-21 DIAGNOSIS — I10 Essential (primary) hypertension: Secondary | ICD-10-CM

## 2017-09-21 DIAGNOSIS — I4819 Other persistent atrial fibrillation: Secondary | ICD-10-CM

## 2017-09-21 DIAGNOSIS — I481 Persistent atrial fibrillation: Secondary | ICD-10-CM | POA: Diagnosis not present

## 2017-09-21 MED ORDER — CLONIDINE HCL 0.1 MG PO TABS: 0.1000 mg | ORAL_TABLET | Freq: Two times a day (BID) | ORAL | 11 refills | Status: DC | PRN

## 2017-09-21 MED ORDER — METOPROLOL SUCCINATE ER 100 MG PO TB24: 100.0000 mg | ORAL_TABLET | Freq: Two times a day (BID) | ORAL | 1 refills | Status: DC

## 2017-09-21 MED ORDER — BUDESONIDE-FORMOTEROL FUMARATE 160-4.5 MCG/ACT IN AERO: 2.0000 | INHALATION_SPRAY | Freq: Two times a day (BID) | RESPIRATORY_TRACT | 5 refills | Status: DC

## 2017-09-21 NOTE — Progress Notes (Signed)
Patient ID: [name]                 DOB: 01-May-1961                      MRN: [id number]     HPI: [name] is a 56 y.o. male patient of DR Hochrein referred by Dr. Martinique to HTN clinic.  PMH includes Afib, hypertension, tobacco abuse, arthritis and lung disease. He had a failed cardioversion on 09/04/2017 and is waiting for cardiologist instructions to schedule next cardioversion. During most recent office visit (09/14/2017) with Dr Percival Spanish his metoprolol succinate was increased to 43m twice daily. Patient reports slight improvement on HR but extremely elevated BP at all times.  Denies increased fatigue, headaches or dizziness. Reports getting short of breath very easily after short walks and physical antivity  Current HTN meds:  Diltiazem ER 1868mdaily Metoprolol succinate 7558mwice daily Lisinopril 5mg37mily  Previously tried:  Amlodipine 10mg84mralazine 25mg 63mHCTZ 25mg d72m Lisinopril 40mg da62mLisinopril/HCTZ 20-25mg (2 21mets) daily - stopped due to dehydration and AKI  BP goal: <130/80  Social History: chronic smoker  Exercise: minimal physical activity. Gets very short of breath after short walks in the backyard  Home BP readings:  5 readings; average 153/108 (pulse 100) 170/126 pulse 105 165/120 pulse 105 155/115 pulse 108 113/85 pulse 93 (immedicatly after waking up) 164/95 pulse 93  Wt Readings from Last 3 Encounters:  09/11/17 244 lb (110.7 kg)  09/04/17 233 lb (105.7 kg)  08/25/17 233 lb 6.4 oz (105.9 kg)   BP Readings from Last 3 Encounters:  09/21/17 (!) 160/102  09/21/17 (!) 160/102  09/14/17 (!) 175/119   Pulse Readings from Last 3 Encounters:  09/21/17 85  09/21/17 85  09/14/17 97    Past Medical History:  Diagnosis Date  . ARF (acute renal failure) (HCC) 9/6/[place]8  . Arthritis   . History of hiatal hernia   . Hypertension   . Lung disease    pt states based on breathing tests at work  . Persistent atrial fibrillation with rapid  ventricular response (H[facility]) 09/0[place]018  . Pneumothorax   . Tobacco use 1973  . Tuberculosis   . Urinary incontinence     Current Outpatient Prescriptions on File Prior to Visit  Medication Sig Dispense Refill  . albuterol (PROVENTIL HFA;VENTOLIN HFA) 108 (90 Base) MCG/ACT inhaler Inhale 2 puffs into the lungs every 6 (six) hours as needed for wheezing or shortness of breath. 18 g 0  . Blood Pressure Monitoring (ADULT BLOOD PRESSURE CUFF LG) KIT 1 adult xl BP cuff kit. 1 each 0  . cloNIDine (CATAPRES) 0.1 MG tablet Take 1 tablet (0.1 mg total) by mouth 2 (two) times daily as needed. Take if blood pressure > 160/110 60 tablet 11  . dabigatran (PRADAXA) 150 MG CAPS capsule Take 150 mg by mouth 2 (two) times daily.    . diltiazMarland Kitchenm (CARDIZEM CD) 180 MG 24 hr capsule Take 1 capsule (180 mg total) by mouth daily. 30 capsule 6  . ePHEDrine-GuaiFENesin (BRONKAID) 25-400 MG TABS Take 1-2 tablets by mouth every 6 (six) hours as needed (for shortness of breath/congestion).     . lisinopMarland Kitchenil (PRINIVIL,ZESTRIL) 5 MG tablet Take 1 tablet (5 mg total) by mouth daily. 30 tablet 11  . metoprolol succinate (TOPROL-XL) 100 MG 24 hr tablet Take 1 tablet (100 mg total) by mouth 2 (two) times daily. 60 tablet 1   No current facility-administered  medications on file prior to visit.     No Known Allergies  Blood pressure (!) 160/102, pulse 85.  Persistent atrial fibrillation with rapid ventricular response ([place]) Patient continues to have elevated HR with poor physical activity tolerability. We discussed need to wait 2 weeks until dose adjustment reach stability and his body develops tolerability before new further adjustment. Will increase metoprolol succinate from 14m twice daily to 1065mtwice daily. Plan to continue dose twice daily until tolerability and HR response know, then may change to metoprolol succinate 20041maily. Metoprolol dose increased expected to help with better BP control as  well.  Hypertension Blood pressure remains elevated during office visit and patient complaining of symptomatic Afib as well. Will increase metoprolol dose today to 100m54mice daily, and start clonidine 0.1mg 51m as needed ONLY for BP above 160/110. Patient to continue BP monitoring at home and follow up with HTN clinic in 3 weeks. Plan to increase lisinopril if needed for better BP control if HR stable; otherwise may change metoprolol to carvedilol for HR control and better BP management.    Hannelore Bova Rodriguez-Guzman PharmD, BCPS, CPP [id number]/2018 4:40 PM

## 2017-09-21 NOTE — Progress Notes (Signed)
This encounter was created in error - please disregard.

## 2017-09-21 NOTE — Telephone Encounter (Signed)
Refilled Symbicort. Patient did not go to pulmonology referral. He will need to follow-up for any additional refills

## 2017-09-21 NOTE — Telephone Encounter (Signed)
Refill request received for Symbicort we have not Rx'd this before. Last office visit 01/22/17. Do you want to Rx this?

## 2017-09-21 NOTE — Telephone Encounter (Signed)
This encounter was created in error - please disregard.

## 2017-09-22 NOTE — Patient Instructions (Signed)
Return for a a follow up appointment in 1 week by phone (3 weeks in clinic)  Your blood pressure today is 160/102 pulse 85  Check your blood pressure at home daily (if able) and keep record of the readings.  Take your BP meds as follows: INCREASE metoprolol succinate to  twice daily START clonidine 0.1mg  twice daily as needed for BP above 160/110 Continue all other medication as prescribed  Bring all of your meds, your BP cuff and your record of home blood pressures to your next appointment.  Exercise as you're able, try to walk approximately 30 minutes per day.  Keep salt intake to a minimum, especially watch canned and prepared boxed foods.  Eat more fresh fruits and vegetables and fewer canned items.  Avoid eating in fast food restaurants.    HOW TO TAKE YOUR BLOOD PRESSURE: . Rest 5 minutes before taking your blood pressure. .  Don't smoke or drink caffeinated beverages for at least 30 minutes before. . Take your blood pressure before (not after) you eat. . Sit comfortably with your back supported and both feet on the floor (don't cross your legs). . Elevate your arm to heart level on a table or a desk. . Use the proper sized cuff. It should fit smoothly and snugly around your bare upper arm. There should be enough room to slip a fingertip under the cuff. The bottom edge of the cuff should be 1 inch above the crease of the elbow. . Ideally, take 3 measurements at one sitting and record the average.

## 2017-09-22 NOTE — Assessment & Plan Note (Signed)
Blood pressure remains elevated during office visit and patient complaining of symptomatic Afib as well. Will increase metoprolol dose today to  twice daily, and start clonidine 0.1mg  BID as needed ONLY for BP above 160/110. Patient to continue BP monitoring at home and follow up with HTN clinic in 3 weeks. Plan to increase lisinopril if needed for better BP control if HR stable; otherwise may change metoprolol to carvedilol for HR control and better BP management.

## 2017-09-22 NOTE — Assessment & Plan Note (Signed)
Patient continues to have elevated HR with poor physical activity tolerability. We discussed need to wait 2 weeks until dose adjustment reach stability and his body develops tolerability before new further adjustment. Will increase metoprolol succinate from  twice daily to  twice daily. Plan to continue dose twice daily until tolerability and HR response know, then may change to metoprolol succinate  daily. Metoprolol dose increased expected to help with better BP control as well.

## 2017-09-28 ENCOUNTER — Telehealth: Payer: Self-pay | Admitting: Pharmacist

## 2017-09-28 MED ORDER — LISINOPRIL 10 MG PO TABS: 10.0000 mg | ORAL_TABLET | Freq: Every day | ORAL | 4 refills | Status: DC

## 2017-09-28 NOTE — Telephone Encounter (Signed)
Patient increased metoprolol to 100mg  BID as instructed , still reporting elevated BP and HR. NO problems with dizziness or increased fatigue reported either.  Recommendation:  Increase lisinopril to 10mg  daily. Repeat BMET in 2 week (during HTN clinic f/u)

## 2017-10-13 ENCOUNTER — Ambulatory Visit (INDEPENDENT_AMBULATORY_CARE_PROVIDER_SITE_OTHER): Payer: BLUE CROSS/BLUE SHIELD | Admitting: Pharmacist Clinician (PhC)/ Clinical Pharmacy Specialist

## 2017-10-13 VITALS — BP 178/116 | HR 104

## 2017-10-13 DIAGNOSIS — I10 Essential (primary) hypertension: Secondary | ICD-10-CM

## 2017-10-13 MED ORDER — DILTIAZEM HCL ER COATED BEADS 240 MG PO CP24: 240.0000 mg | ORAL_CAPSULE | Freq: Every day | ORAL | 6 refills | Status: DC

## 2017-10-13 NOTE — Patient Instructions (Signed)
Return for a a follow up appointment - we will call you once the blood work comes back  Your blood pressure today is 178/116  Check your blood pressure at home daily and keep record of the readings.  Take your BP meds as follows:  Increase diltiazem to 240 mg once daliy  Continue other medication - if the labs look good we will have you increase the lisinopril to 20 mg  Bring all of your meds, your BP cuff and your record of home blood pressures to your next appointment.  Exercise as you're able, try to walk approximately 30 minutes per day.  Keep salt intake to a minimum, especially watch canned and prepared boxed foods.  Eat more fresh fruits and vegetables and fewer canned items.  Avoid eating in fast food restaurants.    HOW TO TAKE YOUR BLOOD PRESSURE: . Rest 5 minutes before taking your blood pressure. .  Don't smoke or drink caffeinated beverages for at least 30 minutes before. . Take your blood pressure before (not after) you eat. . Sit comfortably with your back supported and both feet on the floor (don't cross your legs). . Elevate your arm to heart level on a table or a desk. . Use the proper sized cuff. It should fit smoothly and snugly around your bare upper arm. There should be enough room to slip a fingertip under the cuff. The bottom edge of the cuff should be 1 inch above the crease of the elbow. . Ideally, take 3 measurements at one sitting and record the average.  \

## 2017-10-13 NOTE — Progress Notes (Signed)
Patient ID: [name]                 DOB: January 31, 1961                      MRN: [id number]     HPI: [name] is a 56 y.o. male patient of DR Hochrein referred by Dr. Martinique to HTN clinic.  PMH includes atrial fibrillation, hypertension, tobacco abuse, arthritis and chronic bronchitis. He had a failed cardioversion on 09/04/2017 and is waiting for cardiologist instructions to schedule next cardioversion. In early October his metoprolol was increased to 75 mg bid, then about 2 weeks later to 100 mg bid, as his heart rate was still running 90-110.  BP was also still mostly in the 329-518 systolic range and lisinopril 10 mg daily was added.  He is due for BMET.  Today he returns, noting no significant improvements in his blood pressure or heart rate.  He is a chronic smoker, one pack per day, and admits to having a cigarette in the past 30 minutes.  When asked about this, he notes that he's almost always had a cigarette within 30 minutes of taking his pressure, and once he compared before and after smoking and noted no changes.    Denies increased fatigue, headaches or dizziness. Reports getting short of breath very easily after short walks and physical antivity  Current HTN meds:  Diltiazem ER 180 mg daily Metoprolol succinate 100 mg twice daily Lisinopril 10 daily Clonidine 0.1 mg prn BP > 841 systolic - has been taking most days  Previously tried:  Amlodipine 34m Hydralazine 266mTID HCTZ 2531maily Lisinopril 66m36mily Lisinopril/HCTZ 20-25mg73mtablets) daily - stopped due to dehydration and AKI  BP goal: <130/80  Social History: chronic smoker (1 ppd)  Exercise: minimal physical activity. Gets very short of breath after short walks in the backyard  Home BP readings:  11 readings; average 175/125 (pulse 95)  Wt Readings from Last 3 Encounters:  09/11/17 244 lb (110.7 kg)  09/04/17 233 lb (105.7 kg)  08/25/17 233 lb 6.4 oz (105.9 kg)   BP Readings from Last 3 Encounters:    10/13/17 (!) 178/116  09/21/17 (!) 160/102  09/21/17 (!) 160/102   Pulse Readings from Last 3 Encounters:  10/13/17 (!) 104  09/21/17 85  09/21/17 85    Past Medical History:  Diagnosis Date  . ARF (acute renal failure) (HCC) [place]/2018  . Arthritis   . History of hiatal hernia   . Hypertension   . Lung disease    pt states based on breathing tests at work  . Persistent atrial fibrillation with rapid ventricular response (H[facility]) [place]05/2018  . Pneumothorax   . Tobacco use 1973  . Tuberculosis   . Urinary incontinence     Current Outpatient Medications on File Prior to Visit  Medication Sig Dispense Refill  . albuterol (PROVENTIL HFA;VENTOLIN HFA) 108 (90 Base) MCG/ACT inhaler Inhale 2 puffs into the lungs every 6 (six) hours as needed for wheezing or shortness of breath. 18 g 0  . Blood Pressure Monitoring (ADULT BLOOD PRESSURE CUFF LG) KIT 1 adult xl BP cuff kit. 1 each 0  . budesonide-formoterol (SYMBICORT) 160-4.5 MCG/ACT inhaler Inhale 2 puffs into the lungs 2 (two) times daily. 1 Inhaler 5  . cloNIDine (CATAPRES) 0.1 MG tablet Take 1 tablet (0.1 mg total) by mouth 2 (two) times daily as needed. Take if blood pressure > 160/110 60 tablet 11  .  dabigatran (PRADAXA) 150 MG CAPS capsule Take 150 mg by mouth 2 (two) times daily.    Marland Kitchen ePHEDrine-GuaiFENesin (BRONKAID) 25-400 MG TABS Take 1-2 tablets by mouth every 6 (six) hours as needed (for shortness of breath/congestion).     Marland Kitchen lisinopril (PRINIVIL,ZESTRIL) 10 MG tablet Take 1 tablet (10 mg total) by mouth daily. 30 tablet 4  . metoprolol succinate (TOPROL-XL) 100 MG 24 hr tablet Take 1 tablet (100 mg total) by mouth 2 (two) times daily. 60 tablet 1   No current facility-administered medications on file prior to visit.     No Known Allergies  Blood pressure (!) 178/116, pulse (!) 104.  Hypertension Patient with hypertension and elevated heart rate, still not well controlled.  BMET drawn today shows SCr stable at 1.17, as  lisinopril was recently restarted at 10 mg daily.  Will increase the diltiazem to 240 mg daily and the lisinopril to 20 mg daily.  He is to continue with home BP monitoring.  Patient asking about potential for a repeat DCCV, will send message to Dr. Percival Spanish.  He should be seen again in 3-4 weeks to assess changes in medication.     Raquel Rodriguez-Guzman PharmD, BCPS, [place] [id number] 10/14/2017 8:09 AM

## 2017-10-14 ENCOUNTER — Encounter: Payer: Self-pay | Admitting: Pharmacist Clinician (PhC)/ Clinical Pharmacy Specialist

## 2017-10-14 LAB — BASIC METABOLIC PANEL
BUN/Creatinine Ratio: 10 (ref 9–20)
BUN: 12 mg/dL (ref 6–24)
CALCIUM: 9.7 mg/dL (ref 8.7–10.2)
CHLORIDE: 105 mmol/L (ref 96–106)
CO2: 22 mmol/L (ref 20–29)
Creatinine, Ser: 1.17 mg/dL (ref 0.76–1.27)
GFR calc Af Amer: 79 mL/min/{1.73_m2} (ref >59)
GFR calc non Af Amer: 68 mL/min/{1.73_m2} (ref >59)
GLUCOSE: 95 mg/dL (ref 65–99)
POTASSIUM: 4.6 mmol/L (ref 3.5–5.2)
SODIUM: 144 mmol/L (ref 134–144)

## 2017-10-14 NOTE — Assessment & Plan Note (Signed)
Patient with hypertension and elevated heart rate, still not well controlled.  BMET drawn today shows SCr stable at 1.17, as lisinopril was recently restarted at 10 mg daily.  Will increase the diltiazem to 240 mg daily and the lisinopril to 20 mg daily.  He is to continue with home BP monitoring.  Patient asking about potential for a repeat DCCV, will send message to Dr. Antoine PocheHochrein.  He should be seen again in 3-4 weeks to assess changes in medication.

## 2017-10-15 ENCOUNTER — Telehealth: Payer: Self-pay | Admitting: Cardiology

## 2017-10-15 DIAGNOSIS — Z79899 Other long term (current) drug therapy: Secondary | ICD-10-CM

## 2017-10-15 NOTE — Telephone Encounter (Signed)
New message    Pt is calling about when to follow up and his lab work.

## 2017-10-15 NOTE — Telephone Encounter (Signed)
Returned call to patient-patient states he saw the pharmacist and once his blood work came back we were suppose to send in a new prescription as well as schedule follow up but he is unsure when he needs to be seen again.     OV with Pharmacist 11/6: Patient with hypertension and elevated heart rate, still not well controlled.  BMET drawn today shows SCr stable at 1.17, as lisinopril was recently restarted at 10 mg daily.  Will increase the diltiazem to 240 mg daily and the lisinopril to 20 mg daily.  He is to continue with home BP monitoring.  Patient asking about potential for a repeat DCCV, will send message to Dr. Antoine PocheHochrein.  He should be seen again in 3-4 weeks to assess changes in medication.     Patient made aware lab work was normal and reviewed by Dr. Antoine PocheHochrein.    Advised I would route to pharmacist to see when follow up is needed (3-4 weeks per note) and/or lab work, also if med changes were to be made based on labs?  Patient verbalized understanding.

## 2017-10-16 MED ORDER — LISINOPRIL 20 MG PO TABS: 20.0000 mg | ORAL_TABLET | Freq: Every day | ORAL | 3 refills | Status: DC

## 2017-10-16 NOTE — Telephone Encounter (Signed)
Need repeat BMET in 2 weeks due to lisinopril dose increase from 10mg  to 20mg  daily. (November 19-22).   Should schedule f/u BP monitoring for week of December 3 to Dec/7

## 2017-10-16 NOTE — Telephone Encounter (Signed)
Spoke with patient and made aware of recommendations.   Rx sent to pharmacy (lisinopril that was increased to 20mg ) and lab order placed.   Patient is scheduled to see HTN clinic 12/6 at 1:30pm.     Patient aware and verbalized understanding.

## 2017-11-01 NOTE — Progress Notes (Deleted)
Cardiology Office Note   Date:  11/01/2017   ID:  [name], DOB August 07, 1961, MRN [id number]  PCP:  Ma Hillock, DO  Cardiologist:   Minus Breeding, MD    No chief complaint on file.     History of Present Illness: [name] is a 56 y.o. male who presents for AF.  He was on Pradaxa but stopped because of fear of bleeding.  [facility] admitted on 09/05-09/06/2017 with lightheadedness with rapid afib, also hypovolemic and with ARF. Renal function normalized with hydration and blood pressure improved, heart rate improved as well.  Low dose beta blocker was added. Lisinopril HCTZ 20/25 was discontinued, amlodipine 10 mg was discontinued and hydralazine 10 mg was discontinued.   At the last visit he had atrial fib with rapid ventricular rate.  He subsequently had DCCV.    He only stayed in NSR for about 5 minutes.  He returned today and he is hypertensive and tachycardic.  Since that visit he has had three visits for medication adjustment.  His beta blocker and his calcium channel blocker doses have been increased.  He returns for follow up.  ***    He says that he had a fight with his wife last night and he drank a 12 pack of beer.  He is now hung over.  He does not feel well.  He always knows that his heart is racing.  The patient denies any new symptoms such as chest discomfort, neck or arm discomfort. There has been no new shortness of breath, PND or orthopnea. There have been no reported palpitations, presyncope or syncope.  He says that he did take his meds.     Past Medical History:  Diagnosis Date  . ARF (acute renal failure) ([place]) 08/13/2017  . Arthritis   . History of hiatal hernia   . Hypertension   . Lung disease    pt states based on breathing tests at work  . Persistent atrial fibrillation with rapid ventricular response ([place]) 08/12/2017  . Pneumothorax   . Tobacco use 1973  . Tuberculosis   . Urinary incontinence     Past Surgical History:  Procedure Laterality  Date  . ADRENAL GLAND SURGERY     pt reports adrenal gland removed, he is uncertain which side.   Marland Kitchen CARDIAC CATHETERIZATION     pt describes a cardiac cath procedure as being completed, states it was "clear"  . CARDIOVERSION N/A 09/04/2017   Procedure: CARDIOVERSION;  Surgeon: Josue Hector, MD;  Location: [facility] ENDOSCOPY;  Service: Cardiovascular;  Laterality: N/A;  . ESOPHAGOGASTRODUODENOSCOPY     pt states it was abnormal, but he does not know why  . HERNIA REPAIR    . NASAL HEMORRHAGE CONTROL N/A 02/22/2016   Procedure: EPISTAXIS CONTROL;  Surgeon: Izora Gala, MD;  Location: [facility];  Service: ENT;  Laterality: N/A;     Current Outpatient Medications  Medication Sig Dispense Refill  . albuterol (PROVENTIL HFA;VENTOLIN HFA) 108 (90 Base) MCG/ACT inhaler Inhale 2 puffs into the lungs every 6 (six) hours as needed for wheezing or shortness of breath. 18 g 0  . Blood Pressure Monitoring (ADULT BLOOD PRESSURE CUFF LG) KIT 1 adult xl BP cuff kit. 1 each 0  . budesonide-formoterol (SYMBICORT) 160-4.5 MCG/ACT inhaler Inhale 2 puffs into the lungs 2 (two) times daily. 1 Inhaler 5  . cloNIDine (CATAPRES) 0.1 MG tablet Take 1 tablet (0.1 mg total) by mouth 2 (two) times daily as needed. Take if blood  pressure > 160/110 60 tablet 11  . dabigatran (PRADAXA) 150 MG CAPS capsule Take 150 mg by mouth 2 (two) times daily.    Marland Kitchen diltiazem (CARDIZEM CD) 240 MG 24 hr capsule Take 1 capsule (240 mg total) daily by mouth. 30 capsule 6  . ePHEDrine-GuaiFENesin (BRONKAID) 25-400 MG TABS Take 1-2 tablets by mouth every 6 (six) hours as needed (for shortness of breath/congestion).     Marland Kitchen lisinopril (PRINIVIL,ZESTRIL) 20 MG tablet Take 1 tablet (20 mg total) daily by mouth. 90 tablet 3  . metoprolol succinate (TOPROL-XL) 100 MG 24 hr tablet Take 1 tablet (100 mg total) by mouth 2 (two) times daily. 60 tablet 1   No current facility-administered medications for this visit.     Allergies:   Patient has no known  allergies.    ROS:  Please see the history of present illness.   Otherwise, review of systems are positive for ***.   All other systems are reviewed and negative.    PHYSICAL EXAM: VS:  There were no vitals taken for this visit. , BMI There is no height or weight on file to calculate BMI.  GENERAL:  Well appearing NECK:  No jugular venous distention, waveform within normal limits, carotid upstroke brisk and symmetric, no bruits, no thyromegaly LUNGS:  Clear to auscultation bilaterally CHEST:  Unremarkable HEART:  PMI not displaced or sustained,S1 and S2 within normal limits, no S3,  no clicks, no rubs, *** murmurs, irregular  ABD:  Flat, positive bowel sounds normal in frequency in pitch, no bruits, no rebound, no guarding, no midline pulsatile mass, no hepatomegaly, no splenomegaly EXT:  2 plus pulses throughout, no edema, no cyanosis no clubbing    GENERAL:  Well appearing HEENT:  Pupils equal round and reactive, fundi not visualized, oral mucosa unremarkable NECK:  No jugular venous distention, waveform within normal limits, carotid upstroke brisk and symmetric, no bruits, no thyromegaly LYMPHATICS:  No cervical, inguinal adenopathy LUNGS:  Clear to auscultation bilaterally BACK:  No CVA tenderness CHEST:  Unremarkable HEART:  PMI not displaced or sustained,S1 and S2 within normal limits, no S3, no clicks, no rubs, no murmurs, irregular ABD:  Flat, positive bowel sounds normal in frequency in pitch, no bruits, no rebound, no guarding, no midline pulsatile mass, no hepatomegaly, no splenomegaly EXT:  2 plus pulses throughout, no edema, no cyanosis no clubbing SKIN:  No rashes no nodules NEURO:  Cranial nerves II through XII grossly intact, motor grossly intact throughout PSYCH:  Cognitively intact, oriented to person place and time    EKG:  EKG is *** ordered today. The ekg ordered today demonstrates atrial fibrillation, rate ***, low voltage, axis within normal limits, no acute  ST-T wave changes.   Recent Labs: 08/13/2017: ALT 56 08/14/2017: Hemoglobin 14.2; Platelets 155 10/13/2017: BUN 12; Creatinine, Ser 1.17; Potassium 4.6; Sodium 144    Lipid Panel No results found for: CHOL, TRIG, HDL, CHOLHDL, VLDL, LDLCALC, LDLDIRECT    Wt Readings from Last 3 Encounters:  09/11/17 244 lb (110.7 kg)  09/04/17 233 lb (105.7 kg)  08/25/17 233 lb 6.4 oz (105.9 kg)      Other studies Reviewed: Additional studies/ records that were reviewed today include:  *** Review of the above records demonstrates:  ***   ASSESSMENT AND PLAN:    ATRIAL FIB:   Mr. [name] has a CHA2DS2 - VASc score of 1.  ***    His rate is elevated.  He does not want to be hospitalized.  I am going to increase his beta blocker and see him back on Monday for BP and HR check.  He reports that prior to drinking excessively last night his BP and HR were better controlled.    HTN:  ***  I suggested that he should let me admit him.  He did not want to do this.   We gave him clonidine 0.1 mg 2 and his blood pressure came down slightly but still elevated.  He prefers to not be admitted.  I will increase the beta blocker and I will add lisinopril 5 mg daily.  He will keep his BP at home.  He says that it has been 140/80 most of the time.   I will check a 24 hour urine for VMA/ and  metanephrines.   ETOH:   ***  He is advised not to drink.       Current medicines are reviewed at length with the patient today.  The patient does not have concerns regarding medicines.  The following changes have been made:  ***  Labs/ tests ordered today include: ***  No orders of the defined types were placed in this encounter.    Disposition:   FU with ***   Signed, Minus Breeding, MD  11/01/2017 10:23 AM    [facility]

## 2017-11-02 ENCOUNTER — Ambulatory Visit: Payer: BLUE CROSS/BLUE SHIELD | Admitting: Cardiology

## 2017-11-05 ENCOUNTER — Encounter: Payer: Self-pay | Admitting: *Deleted

## 2017-11-05 ENCOUNTER — Encounter: Payer: Self-pay | Admitting: Cardiology

## 2017-11-05 ENCOUNTER — Ambulatory Visit (INDEPENDENT_AMBULATORY_CARE_PROVIDER_SITE_OTHER): Payer: BLUE CROSS/BLUE SHIELD | Admitting: Cardiology

## 2017-11-05 VITALS — BP 152/102 | HR 110 | Ht 74.0 in | Wt 245.0 lb

## 2017-11-05 DIAGNOSIS — Z87448 Personal history of other diseases of urinary system: Secondary | ICD-10-CM

## 2017-11-05 DIAGNOSIS — I481 Persistent atrial fibrillation: Secondary | ICD-10-CM | POA: Diagnosis not present

## 2017-11-05 DIAGNOSIS — F1010 Alcohol abuse, uncomplicated: Secondary | ICD-10-CM

## 2017-11-05 DIAGNOSIS — F17210 Nicotine dependence, cigarettes, uncomplicated: Secondary | ICD-10-CM

## 2017-11-05 DIAGNOSIS — I10 Essential (primary) hypertension: Secondary | ICD-10-CM

## 2017-11-05 DIAGNOSIS — I4819 Other persistent atrial fibrillation: Secondary | ICD-10-CM

## 2017-11-05 DIAGNOSIS — G4730 Sleep apnea, unspecified: Secondary | ICD-10-CM | POA: Diagnosis not present

## 2017-11-05 DIAGNOSIS — Z7901 Long term (current) use of anticoagulants: Secondary | ICD-10-CM

## 2017-11-05 LAB — TSH: TSH: 2.06 u[IU]/mL (ref 0.450–4.500)

## 2017-11-05 NOTE — Assessment & Plan Note (Signed)
Pt had hypotension and AKI (SCr 2.1) Sept 2018. This has stabilized- SCr 1.1 in Oct 2018

## 2017-11-05 NOTE — Assessment & Plan Note (Signed)
Pt failed DCCV 09/04/17

## 2017-11-05 NOTE — Assessment & Plan Note (Addendum)
Labile- normal LVH with "severe" LVH and mild LAE

## 2017-11-05 NOTE — Assessment & Plan Note (Signed)
CHA2Ds Vasc= 1. He is currently on Pradaxa

## 2017-11-05 NOTE — Progress Notes (Signed)
11/05/2017 [name]   1961-11-14  [id number]  Primary Physician Ma Hillock, DO Primary Cardiologist: Dr Percival Spanish  HPI:  56 y/o male, works as "an Theatre stage manager", who we saw in early Sept 2018 with hypotension and AF with RVR. He had been admitted in Dec 2017 with back pain and was hypertensive then. We think he has had atrial fibrillation in the past, his wife says they wanted him on Pradaxa 6 years ago in [place]  But the pt was concerned about bleeding. In Dec 2017 when he was admitted he was in NSR.  The pt's wife also told her husband had a cath in [place] 6 years ago that showed normal coronaries.  When he came in early Sept his medications were adjusted and he was placed on anticoagulation. He underwent OP DCCV 09/04/17 but quickly went back into AF with RVR. Since then we have been trying to get his B/P under control. The pt has a history of binge ETOH, 1PPD smoking, and he told me today he drinks a gallon of sweat tea a day. He is in the office to f/u his B/P and discuss possible repeat cardioversion. He has occasional palpitations at night but otherwise his main complain is fatigue-"I'm wiped out". His HR is 100, B/P 142/90.    Current Outpatient Medications  Medication Sig Dispense Refill  . albuterol (PROVENTIL HFA;VENTOLIN HFA) 108 (90 Base) MCG/ACT inhaler Inhale 2 puffs into the lungs every 6 (six) hours as needed for wheezing or shortness of breath. 18 g 0  . Blood Pressure Monitoring (ADULT BLOOD PRESSURE CUFF LG) KIT 1 adult xl BP cuff kit. 1 each 0  . budesonide-formoterol (SYMBICORT) 160-4.5 MCG/ACT inhaler Inhale 2 puffs into the lungs 2 (two) times daily. 1 Inhaler 5  . cloNIDine (CATAPRES) 0.1 MG tablet Take 1 tablet (0.1 mg total) by mouth 2 (two) times daily as needed. Take if blood pressure > 160/110 60 tablet 11  . dabigatran (PRADAXA) 150 MG CAPS capsule Take 150 mg by mouth 2 (two) times daily.    Marland Kitchen diltiazem (CARDIZEM CD) 240 MG 24 hr capsule Take 1  capsule (240 mg total) daily by mouth. 30 capsule 6  . ePHEDrine-GuaiFENesin (BRONKAID) 25-400 MG TABS Take 1-2 tablets by mouth every 6 (six) hours as needed (for shortness of breath/congestion).     Marland Kitchen lisinopril (PRINIVIL,ZESTRIL) 20 MG tablet Take 1 tablet (20 mg total) daily by mouth. 90 tablet 3  . metoprolol succinate (TOPROL-XL) 100 MG 24 hr tablet Take 1 tablet (100 mg total) by mouth 2 (two) times daily. 60 tablet 1   No current facility-administered medications for this visit.     No Known Allergies  Past Medical History:  Diagnosis Date  . ARF (acute renal failure) ([place]) 08/13/2017  . Arthritis   . History of hiatal hernia   . Hypertension   . Lung disease    pt states based on breathing tests at work  . Persistent atrial fibrillation with rapid ventricular response ([place]) 08/12/2017  . Pneumothorax   . Tobacco use 1973  . Tuberculosis   . Urinary incontinence     Social History   Socioeconomic History  . Marital status: Married    Spouse name: Burman Nieves  . Number of children: Not on file  . Years of education: 76  . Highest education level: Not on file  Social Needs  . Financial resource strain: Not on file  . Food insecurity - worry: Not on file  .  Food insecurity - inability: Not on file  . Transportation needs - medical: Not on file  . Transportation needs - non-medical: Not on file  Occupational History  . Occupation: Electrical engineer  Tobacco Use  . Smoking status: Current Every Day Smoker    Packs/day: 1.00    Years: 45.00    Pack years: 45.00    Types: Cigarettes  . Smokeless tobacco: Never Used  Substance and Sexual Activity  . Alcohol use: Yes    Alcohol/week: 7.2 oz    Types: 12 Cans of beer per week  . Drug use: No  . Sexual activity: Yes    Partners: Female    Birth control/protection: None    Comment: married  Other Topics Concern  . Not on file  Social History Narrative   married to [place].   15 years education. 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    Drinks caffeine, takes a daily vitamin.   Wears a seatbelt. Smoke detector in the home.   Wears dentures.   Feels safe in relationships.     Family History  Problem Relation Age of Onset  . Arthritis Mother   . Alcohol abuse Father   . Mental illness Father   . Early death Father   . Arthritis Brother   . Heart disease Brother      Review of Systems: General: negative for chills, fever, night sweats or weight changes.  Cardiovascular: negative for chest pain, dyspnea on exertion, edema, orthopnea, palpitations, paroxysmal nocturnal dyspnea or shortness of breath Dermatological: negative for rash Respiratory: negative for cough or wheezing Urologic: negative for hematuria Abdominal: negative for nausea, vomiting, diarrhea, bright red blood per rectum, melena, or hematemesis Neurologic: negative for visual changes, syncope, or dizziness All other systems reviewed and are otherwise negative except as noted above.    Blood pressure (!) 152/102, pulse (!) 110, height 6' 2" (1.88 m), weight 245 lb (111.1 kg), SpO2 98 %.  General appearance: alert, cooperative, no distress, mildly obese and a little anxious Neck: no carotid bruit and no JVD Lungs: clear to auscultation bilaterally Heart: irregularly irregular rhythm Extremities: no edema Skin: cool, moist- his shirt was soaked through Neurologic: Grossly normal  EKG AF with VR- 101  ASSESSMENT AND PLAN:   Persistent atrial fibrillation with rapid ventricular response (HCC) Pt failed DCCV 09/04/17  Heavy cigarette smoker 1 PPD  Hypertension Labile- normal LVH with "severe" LVH and mild LAE  ETOH abuse Currently not drinking  Chronic anticoagulation CHA2Ds Vasc= 1. He is currently on Pradaxa  History of renal insufficiency Pt had hypotension and AKI (SCr 2.1) Sept 2018. This has stabilized- SCr 1.1 in Oct 2018   PLAN  I had a long talk with the pt and his wife and learned some interesting  information-he had normal coronaries in [place] 6 years ago- the fact that has drinks a gallon of sweat tea daily and has "all my adult life", and the pt's wife also told he stops breathing at night and she has to wake him up at times to get him to start breathing again. The pt tell me he has never had a sleep study. He wanted to know about repeat cardioversion, I'm not sure he's there yet. I asked him to stop drinking the tea and cut back on his smoking. His B/P is improving and I did not adjust his medications today. I ordered a sleep study ( almost assured he has sleep apnea) and a TSH today. I'll discuss long term plan for  his atrial fibrillation with Dr Percival Spanish. The pt does want to return to work and does not feel well enough to do that since he has been in AF.    Kerin Ransom PA-C 11/05/2017 4:54 PM

## 2017-11-05 NOTE — Assessment & Plan Note (Signed)
Currently not drinking 

## 2017-11-05 NOTE — Patient Instructions (Signed)
Medication Instructions: Your physician recommends that you continue on your current medications as directed. Please refer to the Current Medication list given to you today.  Labwork: Your physician recommends that you return for lab work today: TSH   Testing/Procedures: Your physician has recommended that you have a sleep study. This test records several body functions during sleep, including: brain activity, eye movement, oxygen and carbon dioxide blood levels, heart rate and rhythm, breathing rate and rhythm, the flow of air through your mouth and nose, snoring, body muscle movements, and chest and belly movement.  Follow-Up: We will call you to set up a follow-up appointment.

## 2017-11-05 NOTE — Assessment & Plan Note (Signed)
1 PPD 

## 2017-11-12 ENCOUNTER — Ambulatory Visit (INDEPENDENT_AMBULATORY_CARE_PROVIDER_SITE_OTHER): Payer: Self-pay | Admitting: Pharmacist Clinician (PhC)/ Clinical Pharmacy Specialist

## 2017-11-12 VITALS — BP 166/102 | HR 114

## 2017-11-12 DIAGNOSIS — I158 Other secondary hypertension: Secondary | ICD-10-CM

## 2017-11-12 MED ORDER — SPIRONOLACTONE 25 MG PO TABS: ORAL_TABLET | ORAL | 3 refills | Status: DC

## 2017-11-12 NOTE — Progress Notes (Signed)
Patient ID: [name]                 DOB: 07-27-1961                      MRN: [id number]     HPI: [name] is a 56 y.o. male patient of DR Percival Spanish here for follow up in the HTN clinic.  PMH includes atrial fibrillation, hypertension, tobacco abuse, arthritis and chronic bronchitis. He had a failed cardioversion on 09/04/2017 and is waiting for cardiologist instructions to schedule next cardioversion. In early October his metoprolol was increased to 75 mg bid, then about 2 weeks later to 100 mg bid, as his heart rate was still running 90-110.  BP was also still mostly in the 144-818 systolic range and lisinopril 10 mg daily was added.    Since I saw him last he saw Kerin Ransom.  In conversation with patient and his wife they noted that he stops breathing during the nights and she has to wake him to get him to breathe again.  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   I advised that it is still best to do the sleep study so they can determine exactly what the problem is.    His home BP readings have been unchanged over the past several months.  He is mostly in AF, so I am not sure about the complete accuracy of those readings.  However in the office today there was a similar result.   Today he mentioned that his blood pressure used to give him problems before he had an adrenal tumor removed.   He said that a tumor was removed from his left kidney, but one on his right kidney was considered non-operable at that time.     Denies increased fatigue, headaches or dizziness. Reports getting short of breath very easily after short walks and physical activity.  He appears to be in chronic atrial fibrillation.     Current HTN meds:  Diltiazem ER 240 mg daily Metoprolol succinate 100 mg twice daily Lisinopril 20 daily Clonidine 0.1 mg prn BP > 563 systolic - has been taking most days  Previously tried:    Amlodipine 60m Hydralazine 26mTID HCTZ 2537maily Lisinopril 49m68mily Lisinopril/HCTZ 20-25mg36mtablets) daily - stopped due to dehydration and AKI  BP goal: <130/80  Social History: chronic smoker (has recently cut back from 1 ppd to 1/2 ppd); alcohol limited to just social, rarely more than 2-3 beers per week; has quit drinking sweet tea completely and switched to lemonade  Exercise: minimal physical activity. Gets very short of breath after short walks in the backyard  Home BP readings:  Most readings on his home meter range 150-190/90-120   Wt Readings from Last 3 Encounters:  11/05/17 245 lb (111.1 kg)  09/11/17 244 lb (110.7 kg)  09/04/17 233 lb (105.7 kg)   BP Readings from Last 3 Encounters:  11/12/17 (!) 166/102  11/05/17 (!) 152/102  10/13/17 (!) 178/116   Pulse Readings from Last 3 Encounters:  11/12/17 (!) 114  11/05/17 (!) 110  10/13/17 (!) 104    Past Medical History:  Diagnosis Date  . ARF (acute renal failure) (HCC) [place]/2018  . Arthritis   . History of hiatal hernia   . Hypertension   . Lung disease    pt states based on  breathing tests at work  . Persistent atrial fibrillation with rapid ventricular response ([place]) 08/12/2017  . Pneumothorax   . Tobacco use 1973  . Tuberculosis   . Urinary incontinence     Current Outpatient Medications on File Prior to Visit  Medication Sig Dispense Refill  . albuterol (PROVENTIL HFA;VENTOLIN HFA) 108 (90 Base) MCG/ACT inhaler Inhale 2 puffs into the lungs every 6 (six) hours as needed for wheezing or shortness of breath. 18 g 0  . Blood Pressure Monitoring (ADULT BLOOD PRESSURE CUFF LG) KIT 1 adult xl BP cuff kit. 1 each 0  . budesonide-formoterol (SYMBICORT) 160-4.5 MCG/ACT inhaler Inhale 2 puffs into the lungs 2 (two) times daily. 1 Inhaler 5  . cloNIDine (CATAPRES) 0.1 MG tablet Take 1 tablet (0.1 mg total) by mouth 2 (two) times daily as needed. Take if blood pressure > 160/110 60 tablet 11  .  dabigatran (PRADAXA) 150 MG CAPS capsule Take 150 mg by mouth 2 (two) times daily.    Marland Kitchen diltiazem (CARDIZEM CD) 240 MG 24 hr capsule Take 1 capsule (240 mg total) daily by mouth. 30 capsule 6  . ePHEDrine-GuaiFENesin (BRONKAID) 25-400 MG TABS Take 1-2 tablets by mouth every 6 (six) hours as needed (for shortness of breath/congestion).     Marland Kitchen lisinopril (PRINIVIL,ZESTRIL) 20 MG tablet Take 1 tablet (20 mg total) daily by mouth. 90 tablet 3  . metoprolol succinate (TOPROL-XL) 100 MG 24 hr tablet Take 1 tablet (100 mg total) by mouth 2 (two) times daily. 60 tablet 1   No current facility-administered medications on file prior to visit.     No Known Allergies  Blood pressure (!) 166/102, pulse (!) 114.  Hypertension Patient with chronically high BP, not affected by current medications.  He mentioned that he was previously diagnosed with an adrenal tumor.   Will start him on spironolactone 25 mg once daily and have him repeat BMET in 2 weeks.   At that time if his potassium is stable, we can consider increasing the spironolactone for better BP control.  He is to call in 3 weeks and report his home BP readings.   He was praised on cutting back on his cigarettes and caffeine.     Tommy Medal PharmD CPP [facility] [address] [place] [address] 11/12/2017 3:28 PM

## 2017-11-12 NOTE — Assessment & Plan Note (Signed)
Patient with chronically high BP, not affected by current medications.  He mentioned that he was previously diagnosed with an adrenal tumor.   Will start him on spironolactone 25 mg once daily and have him repeat BMET in 2 weeks.   At that time if his potassium is stable, we can consider increasing the spironolactone for better BP control.  He is to call in 3 weeks and report his home BP readings.   He was praised on cutting back on his cigarettes and caffeine.

## 2017-11-12 NOTE — Patient Instructions (Addendum)
Call in 2-3 weeks with your home BP readings.  [phone]  Kristin/Raquel  Your blood pressure today is 166/102  Check your blood pressure at home daily and keep record of the readings.  Take your BP meds as follows:  Start spironolactone 25 mg once daily.    Go to the lab in 2 weeks to get your potassium level checked.   Continue with all other medications  Bring all of your meds, your BP cuff and your record of home blood pressures to your next appointment.  Exercise as you're able, try to walk approximately 30 minutes per day.  Keep salt intake to a minimum, especially watch canned and prepared boxed foods.  Eat more fresh fruits and vegetables and fewer canned items.  Avoid eating in fast food restaurants.    HOW TO TAKE YOUR BLOOD PRESSURE: . Rest 5 minutes before taking your blood pressure. .  Don't smoke or drink caffeinated beverages for at least 30 minutes before. . Take your blood pressure before (not after) you eat. . Sit comfortably with your back supported and both feet on the floor (don't cross your legs). . Elevate your arm to heart level on a table or a desk. . Use the proper sized cuff. It should fit smoothly and snugly around your bare upper arm. There should be enough room to slip a fingertip under the cuff. The bottom edge of the cuff should be 1 inch above the crease of the elbow. . Ideally, take 3 measurements at one sitting and record the average.

## 2017-11-17 ENCOUNTER — Other Ambulatory Visit: Payer: Self-pay | Admitting: Cardiology

## 2017-11-18 NOTE — Telephone Encounter (Signed)
Rx(s) sent to pharmacy electronically.  

## 2017-11-27 DIAGNOSIS — I158 Other secondary hypertension: Secondary | ICD-10-CM | POA: Diagnosis not present

## 2017-11-27 LAB — BASIC METABOLIC PANEL
BUN / CREAT RATIO: 14 (ref 9–20)
BUN: 15 mg/dL (ref 6–24)
CO2: 22 mmol/L (ref 20–29)
CREATININE: 1.07 mg/dL (ref 0.76–1.27)
Calcium: 9.4 mg/dL (ref 8.7–10.2)
Chloride: 103 mmol/L (ref 96–106)
GFR, EST AFRICAN AMERICAN: 88 mL/min/{1.73_m2} (ref >59)
GFR, EST NON AFRICAN AMERICAN: 76 mL/min/{1.73_m2} (ref >59)
Glucose: 93 mg/dL (ref 65–99)
Potassium: 4.4 mmol/L (ref 3.5–5.2)
SODIUM: 141 mmol/L (ref 134–144)

## 2017-12-11 ENCOUNTER — Telehealth: Payer: Self-pay | Admitting: Cardiology

## 2017-12-11 NOTE — Telephone Encounter (Signed)
New message   Pt c/o medication issue:  1. Name of Medication: dabigatran (PRADAXA) 150 MG CAPS capsule  2. How are you currently taking this medication (dosage and times per day)? Take 150 mg by mouth 2 (two) times daily  3. Are you having a reaction (difficulty breathing--STAT)? No  4. What is your medication issue? Patient says that he needs a cheaper medication because of insurance. Please call

## 2017-12-11 NOTE — Telephone Encounter (Signed)
Returned the call to the patient. He stated that his Pradaxa will now cost him over $400 a month. He would like to know if he can take a replacement or if he can be off of the medication. Message will be routed to the provider for his recommendation.

## 2017-12-13 NOTE — Telephone Encounter (Signed)
He needs to be on an anticoagulant.  I don't see that he is scheduled for follow up and he should see me this month.  We could talk about a different anticoagulant at that time.  Warfarin would be cheap.

## 2017-12-14 NOTE — Telephone Encounter (Signed)
Left a message to cal back

## 2017-12-17 NOTE — Telephone Encounter (Signed)
Patient has been made aware that he has an appointment on 1/31 to discuss the option of Warfarin. He verbalized his understanding.

## 2018-01-04 IMAGING — CR DG LUMBAR SPINE COMPLETE 4+V
5 series · 5 of 5 positions shown · non-contrast
Comparison: No recent prior .

CLINICAL DATA: Injury.

EXAM:
LUMBAR SPINE - COMPLETE 4+ VIEW

[t lumbar spine ap]
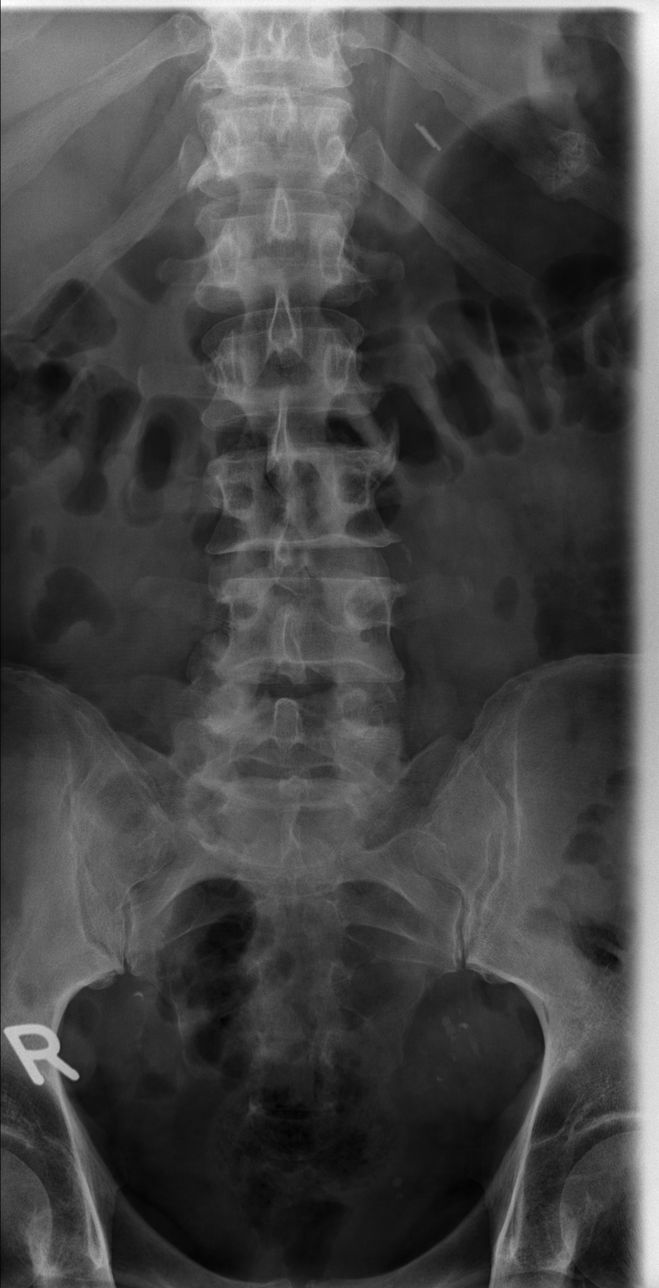

[t lumbar spine obl (1 of 2)]
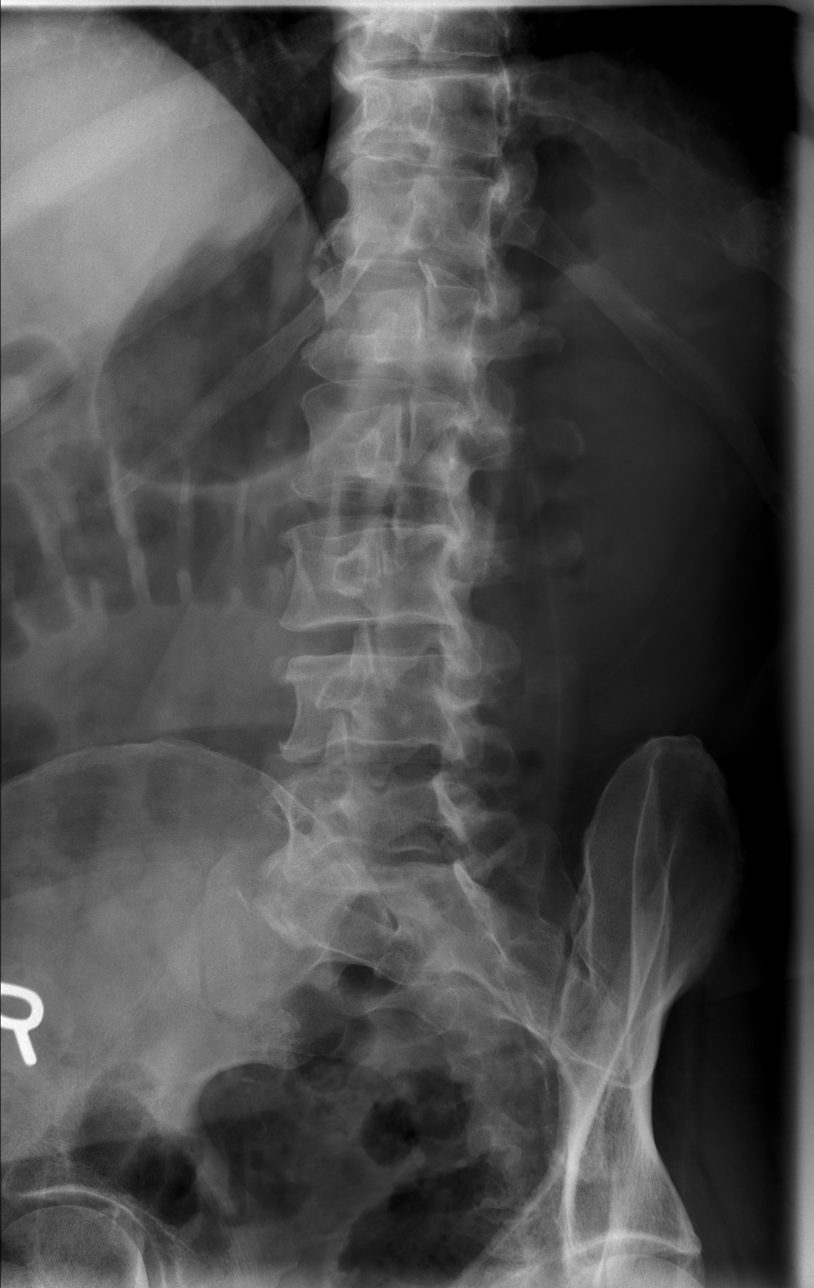

[t lumbar spine obl (2 of 2)]
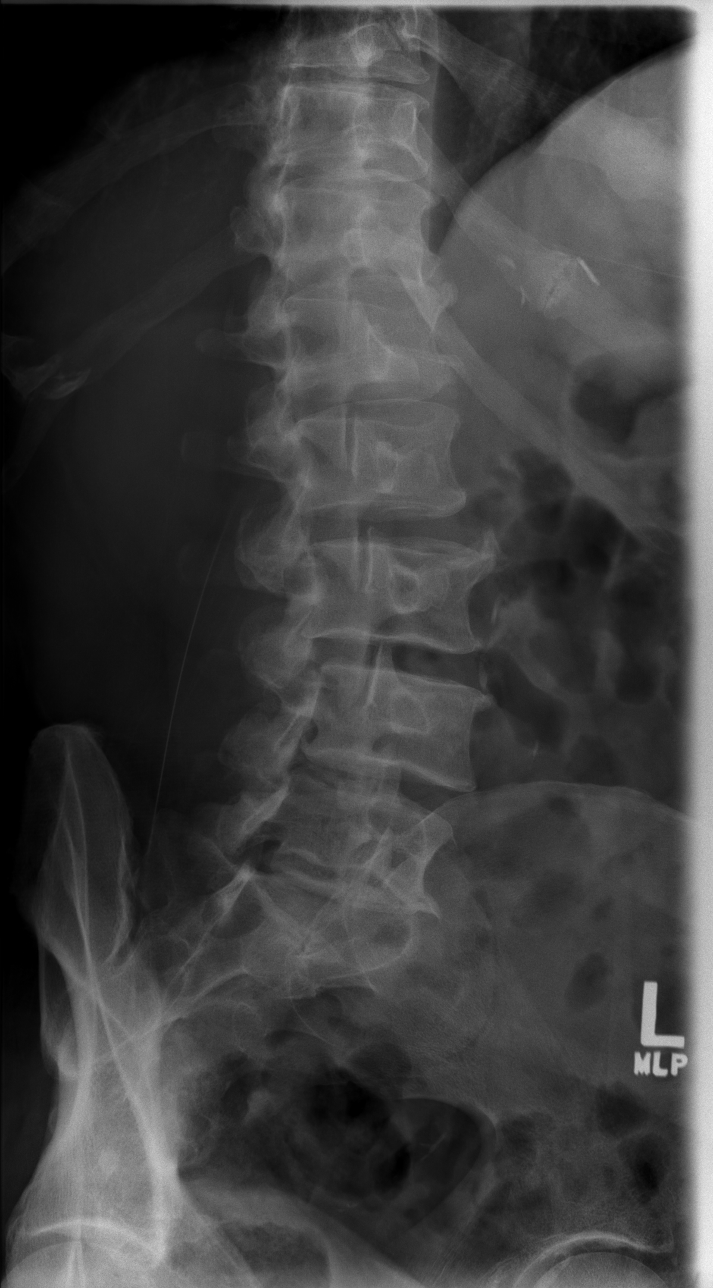

[t lumbar spine lat]
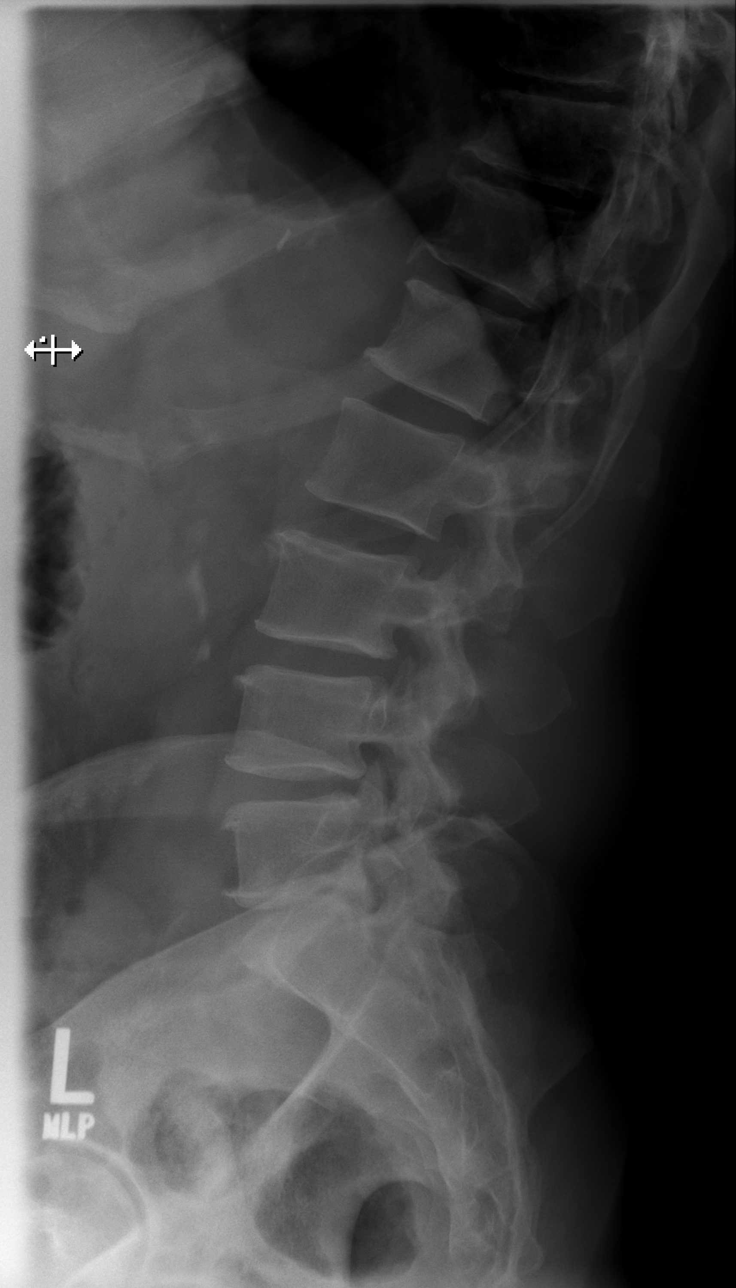

[t lumbar l-5 s-1 spot]
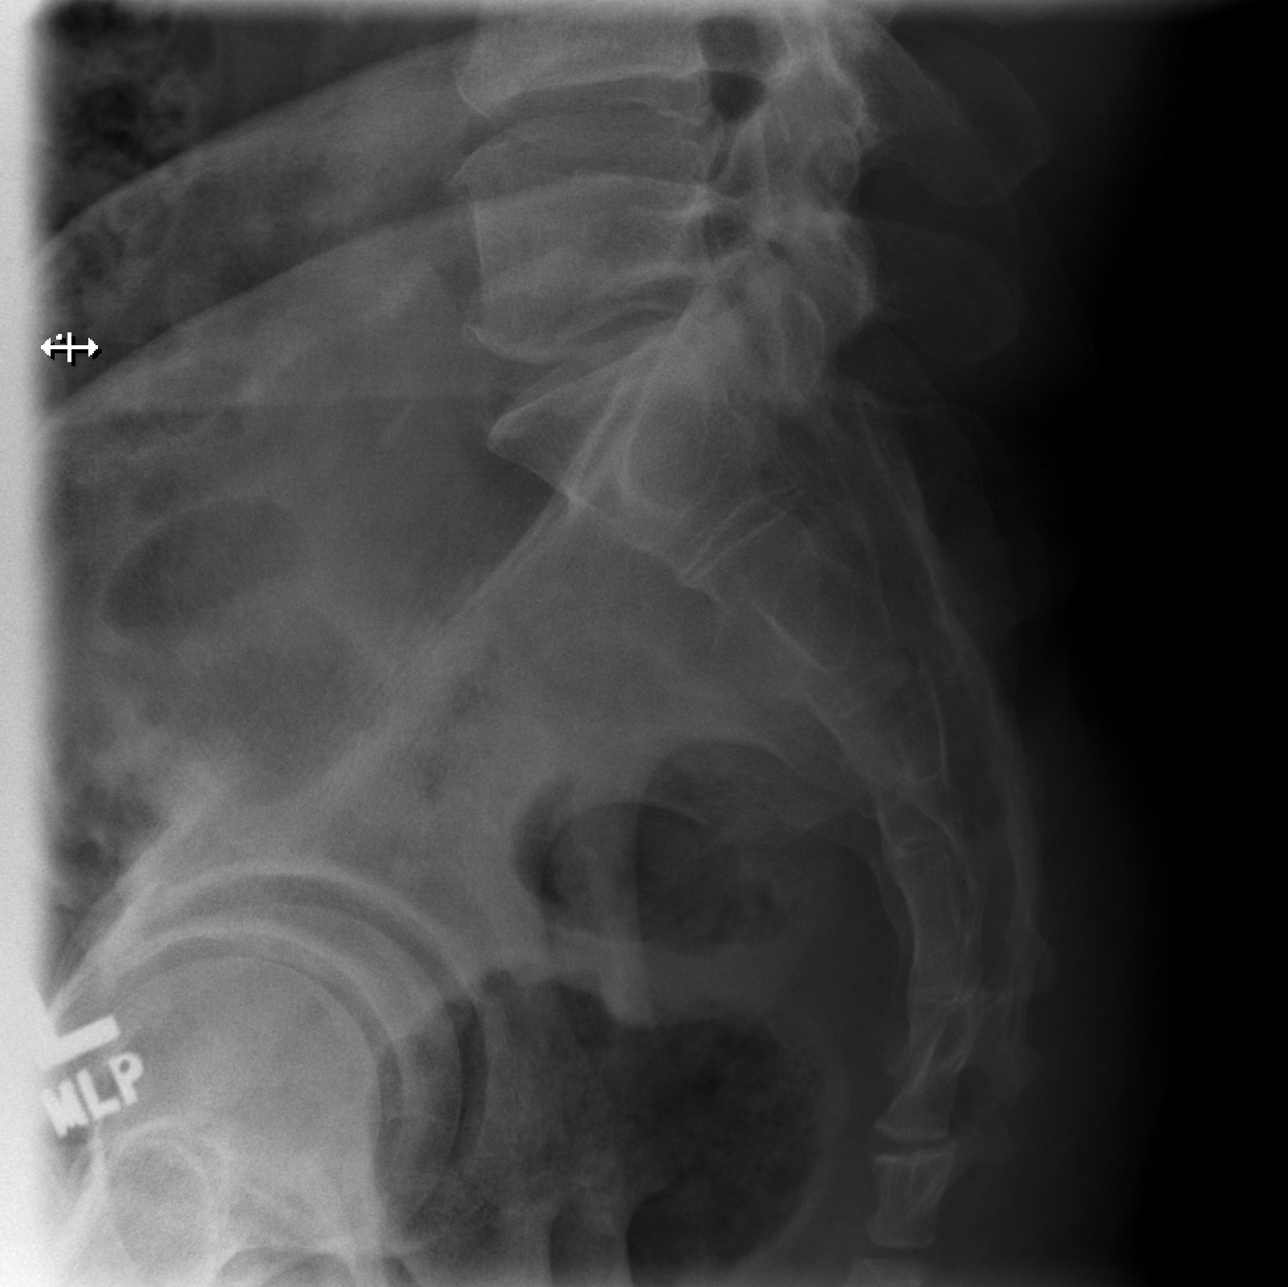

[5 of 5 positions shown; findings below may reference images not displayed]

FINDINGS: Diffuse multilevel degenerative change. Mild scoliosis. No acute
abnormality identified. Normal alignment and mineralization. Aortic
atherosclerotic vascular calcification.
IMPRESSION: Diffuse multilevel degenerative change. Mild scoliosis. No acute
abnormality identified.

## 2018-01-07 ENCOUNTER — Encounter: Payer: Self-pay | Admitting: Cardiology

## 2018-01-07 ENCOUNTER — Ambulatory Visit (INDEPENDENT_AMBULATORY_CARE_PROVIDER_SITE_OTHER): Payer: BLUE CROSS/BLUE SHIELD | Admitting: Cardiology

## 2018-01-07 VITALS — BP 175/118 | HR 73 | Ht 74.0 in | Wt 254.8 lb

## 2018-01-07 DIAGNOSIS — I481 Persistent atrial fibrillation: Secondary | ICD-10-CM

## 2018-01-07 DIAGNOSIS — G4719 Other hypersomnia: Secondary | ICD-10-CM | POA: Diagnosis not present

## 2018-01-07 DIAGNOSIS — G4733 Obstructive sleep apnea (adult) (pediatric): Secondary | ICD-10-CM

## 2018-01-07 DIAGNOSIS — I4819 Other persistent atrial fibrillation: Secondary | ICD-10-CM

## 2018-01-07 MED ORDER — CLONIDINE HCL 0.1 MG PO TABS: 0.1000 mg | ORAL_TABLET | Freq: Two times a day (BID) | ORAL | 3 refills | Status: DC

## 2018-01-07 MED ORDER — FUROSEMIDE 40 MG PO TABS: ORAL_TABLET | ORAL | 0 refills | Status: DC

## 2018-01-07 MED ORDER — WARFARIN SODIUM 5 MG PO TABS: 5.0000 mg | ORAL_TABLET | Freq: Every day | ORAL | 0 refills | Status: DC

## 2018-01-07 NOTE — Patient Instructions (Addendum)
Medication Instructions:  INCREASE Clonidine 0.1mg  take 1 tablet twice a day  START Lasix 40 mg take 1 tablet for 4 days ONLY START Warfarin 5mg  take 1 tablet daily in the evenings  Labwork: None   Testing/Procedures: Your physician has recommended that you have a sleep study. This test records several body functions during sleep, including: brain activity, eye movement, oxygen and carbon dioxide blood levels, heart rate and rhythm, breathing rate and rhythm, the flow of air through your mouth and nose, snoring, body muscle movements, and chest and belly movement.  Follow-Up: Your physician recommends that you schedule a follow-up appointment in: 2 months with Corine ShelterLuke Kilroy, PA-C   Any Other Special Instructions Will Be Listed Below (If Applicable). If you need a refill on your cardiac medications before your next appointment, please call your pharmacy.

## 2018-01-07 NOTE — Progress Notes (Signed)
01/07/2018 [name]   18-Oct-1961  [id number]  Primary Physician Natalia Leatherwood, DO Primary Cardiologist: Dr Antoine Poche  HPI:  57 y/o male, works as "an Naval architect", lived in [place] until recently, who we saw in early Sept 2018 with hypotension and AF with RVR. He had been admitted in Dec 2017 with back pain and was hypertensive then. We think he has had atrial fibrillation in the past, his wife says they wanted him on Pradaxa 6 years ago in [place]  But the pt was concerned about bleeding. In Dec 2017 when he was admitted he was in NSR.  The pt's wife also told me her husband had a cath in [place] 6 years ago that showed normal coronaries.  When he came in early Sept 2018 his medications were adjusted and he was placed on anticoagulation. He underwent OP DCCV 09/04/17 but quickly went back into AF with RVR. Since then we have been trying to get his B/P under control. The pt has a history of past binge ETOH, 1PPD smoking, and he had been drinking a gallon of sweat tea a day. I saw him in the office in Nov 2018 and felt he needed mor tuning up before another attempt at cardioversion. I suggested he stop drinking sweat tea, stop smoking, stop ETOH, and get a sleep study. He is seeing me today in follow up.  Since I saw him last he has had trouble affording his medications. He has been out of work. He admits he has only been taking one Pradaxa a day for the past few weeks. He has also only been taking one clonidine a day. In the office today he did tell me he stopped drinking sweet tea and cut his smoking back to 1/2 pack a day. He remains in AF with VR 105. He tells me he is more SOB, his weight is up 10 lbs. He has not scheduled his sleep study because of finances.     Current Outpatient Medications  Medication Sig Dispense Refill  . albuterol (PROVENTIL HFA;VENTOLIN HFA) 108 (90 Base) MCG/ACT inhaler Inhale 2 puffs into the lungs every 6 (six) hours as needed for wheezing or shortness  of breath. 18 g 0  . budesonide-formoterol (SYMBICORT) 160-4.5 MCG/ACT inhaler Inhale 2 puffs into the lungs 2 (two) times daily. 1 Inhaler 5  . cloNIDine (CATAPRES) 0.1 MG tablet Take 1 tablet (0.1 mg total) by mouth 2 (two) times daily. 60 tablet 3  . diltiazem (CARDIZEM CD) 240 MG 24 hr capsule Take 1 capsule (240 mg total) daily by mouth. 30 capsule 6  . ePHEDrine-GuaiFENesin (BRONKAID) 25-400 MG TABS Take 1-2 tablets by mouth every 6 (six) hours as needed (for shortness of breath/congestion).     Marland Kitchen lisinopril (PRINIVIL,ZESTRIL) 20 MG tablet Take 1 tablet (20 mg total) daily by mouth. 90 tablet 3  . metoprolol succinate (TOPROL-XL) 100 MG 24 hr tablet TAKE 1 TABLET BY MOUTH TWICE A DAY 60 tablet 6  . spironolactone (ALDACTONE) 25 MG tablet Take 1-2 tablets by mouth daily as directed 60 tablet 3  . furosemide (LASIX) 40 MG tablet Take 1 tablet once a day for 4 days ONLY 20 tablet 0  . warfarin (COUMADIN) 5 MG tablet Take 1 tablet (5 mg total) by mouth daily. 30 tablet 0   No current facility-administered medications for this visit.     No Known Allergies  Past Medical History:  Diagnosis Date  . ARF (acute renal failure) (HCC) 08/13/2017  .  Arthritis   . History of hiatal hernia   . Hypertension   . Lung disease    pt states based on breathing tests at work  . Persistent atrial fibrillation with rapid ventricular response (HCC) 08/12/2017  . Pneumothorax   . Tobacco use 1973  . Tuberculosis   . Urinary incontinence     Social History   Socioeconomic History  . Marital status: Married    Spouse name: [name]  . Number of children: Not on file  . Years of education: 7815  . Highest education level: Not on file  Social Needs  . Financial resource strain: Not on file  . Food insecurity - worry: Not on file  . Food insecurity - inability: Not on file  . Transportation needs - medical: Not on file  . Transportation needs - non-medical: Not on file  Occupational History  .  Occupation: Retail bankerAircraft Mechanic  Tobacco Use  . Smoking status: Current Every Day Smoker    Packs/day: 1.00    Years: 45.00    Pack years: 45.00    Types: Cigarettes  . Smokeless tobacco: Never Used  Substance and Sexual Activity  . Alcohol use: Yes    Alcohol/week: 7.2 oz    Types: 12 Cans of beer per week  . Drug use: No  . Sexual activity: Yes    Partners: Female    Birth control/protection: None    Comment: married  Other Topics Concern  . Not on file  Social History Narrative   married to [place].   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    Drinks caffeine, takes a daily vitamin.   Wears a seatbelt. Smoke detector in the home.   Wears dentures.   Feels safe in relationships.     Family History  Problem Relation Age of Onset  . Arthritis Mother   . Alcohol abuse Father   . Mental illness Father   . Early death Father   . Arthritis Brother   . Heart disease Brother   . Lymphoma Brother      Review of Systems: General: negative for chills, fever, night sweats or weight changes.  Cardiovascular: negative for chest pain, dyspnea on exertion, edema, orthopnea, palpitations, paroxysmal nocturnal dyspnea or shortness of breath Dermatological: negative for rash Respiratory: negative for cough or wheezing Urologic: negative for hematuria Abdominal: negative for nausea, vomiting, diarrhea, bright red blood per rectum, melena, or hematemesis Neurologic: negative for visual changes, syncope, or dizziness All other systems reviewed and are otherwise negative except as noted above.    Blood pressure (!) 175/118, pulse 73, height 6\' 2"  (1.88 m), weight 254 lb 12.8 oz (115.6 kg).  General appearance: alert, cooperative, appears stated age, no distress and mildly obese Neck: no carotid bruit and no JVD Lungs: clear to auscultation bilaterally Heart: irregularly irregular rhythm Extremities: extremities normal, atraumatic, no cyanosis or edema Skin: Skin color,  texture, turgor normal. No rashes or lesions Neurologic: Grossly normal  EKG AF-VR 105  ASSESSMENT AND PLAN:    Persistent atrial fibrillation with rapid ventricular response (HCC) Pt failed DCCV 09/04/17  Heavy cigarette smoker 1/2 PPD  Hypertension Labile- normal LVF with "severe" LVH and mild LAE on echo Sept 2018  ETOH abuse Currently not drinking  Chronic anticoagulation CHA2Ds Vasc= 1. He is currently on Pradaxa, switch to Warfarin  History of renal insufficiency Pt had hypotension and AKI (SCr 2.1) Sept 2018. This has stabilized- SCr 1.1 in Oct 2018  Sleep apnea Clearly has sleep apnea  by history  Normal coronaries Per his history in [place]  Bilateral adrenal tumor He had Lt adrenal tumor removed in [place]. He tells me his PCP knows about his Rt adrenal tumor.   PLAN  I strongly urged him to get his sleep study scheduled. I also asked that he continue to work on smoking cessation. Belenda Cruise saw him today and we'll switch him to Warfarin. I increased his Clonidine to 01.mg BID and gave him an Rx for Lasix 40 mg QD x 3. I told him we would see him back after he gets his sleep study.   Corine Shelter PA-C 01/07/2018 4:59 PM

## 2018-01-12 ENCOUNTER — Ambulatory Visit (INDEPENDENT_AMBULATORY_CARE_PROVIDER_SITE_OTHER): Payer: BLUE CROSS/BLUE SHIELD | Admitting: Pharmacist

## 2018-01-12 DIAGNOSIS — Z7901 Long term (current) use of anticoagulants: Secondary | ICD-10-CM | POA: Diagnosis not present

## 2018-01-12 DIAGNOSIS — I481 Persistent atrial fibrillation: Secondary | ICD-10-CM

## 2018-01-12 DIAGNOSIS — I4819 Other persistent atrial fibrillation: Secondary | ICD-10-CM

## 2018-01-12 LAB — POCT INR: INR: 1.3

## 2018-01-12 MED ORDER — ALBUTEROL SULFATE HFA 108 (90 BASE) MCG/ACT IN AERS: 2.0000 | INHALATION_SPRAY | Freq: Four times a day (QID) | RESPIRATORY_TRACT | 0 refills | Status: DC | PRN

## 2018-01-12 MED ORDER — LISINOPRIL 20 MG PO TABS: 20.0000 mg | ORAL_TABLET | Freq: Every day | ORAL | 3 refills | Status: DC

## 2018-01-19 ENCOUNTER — Ambulatory Visit (INDEPENDENT_AMBULATORY_CARE_PROVIDER_SITE_OTHER): Payer: BLUE CROSS/BLUE SHIELD | Admitting: Pharmacist Clinician (PhC)/ Clinical Pharmacy Specialist

## 2018-01-19 DIAGNOSIS — Z7901 Long term (current) use of anticoagulants: Secondary | ICD-10-CM | POA: Diagnosis not present

## 2018-01-19 DIAGNOSIS — I4819 Other persistent atrial fibrillation: Secondary | ICD-10-CM

## 2018-01-19 DIAGNOSIS — I481 Persistent atrial fibrillation: Secondary | ICD-10-CM | POA: Diagnosis not present

## 2018-01-19 LAB — POCT INR: INR: 2.4

## 2018-01-22 ENCOUNTER — Encounter: Payer: Self-pay | Admitting: *Deleted

## 2018-01-26 ENCOUNTER — Ambulatory Visit (INDEPENDENT_AMBULATORY_CARE_PROVIDER_SITE_OTHER): Payer: BLUE CROSS/BLUE SHIELD | Admitting: Pharmacist

## 2018-01-26 DIAGNOSIS — I4819 Other persistent atrial fibrillation: Secondary | ICD-10-CM

## 2018-01-26 DIAGNOSIS — Z7901 Long term (current) use of anticoagulants: Secondary | ICD-10-CM

## 2018-01-26 LAB — POCT INR: INR: 2.2

## 2018-02-02 ENCOUNTER — Other Ambulatory Visit: Payer: Self-pay | Admitting: Cardiology

## 2018-02-02 ENCOUNTER — Encounter (HOSPITAL_BASED_OUTPATIENT_CLINIC_OR_DEPARTMENT_OTHER): Payer: BLUE CROSS/BLUE SHIELD

## 2018-02-14 ENCOUNTER — Ambulatory Visit (HOSPITAL_BASED_OUTPATIENT_CLINIC_OR_DEPARTMENT_OTHER): Payer: BLUE CROSS/BLUE SHIELD

## 2018-02-27 ENCOUNTER — Encounter (HOSPITAL_BASED_OUTPATIENT_CLINIC_OR_DEPARTMENT_OTHER): Payer: BLUE CROSS/BLUE SHIELD

## 2018-03-03 ENCOUNTER — Other Ambulatory Visit: Payer: Self-pay | Admitting: Pharmacist Clinician (PhC)/ Clinical Pharmacy Specialist

## 2018-03-08 ENCOUNTER — Encounter: Payer: Self-pay | Admitting: Cardiology

## 2018-03-08 ENCOUNTER — Ambulatory Visit (HOSPITAL_BASED_OUTPATIENT_CLINIC_OR_DEPARTMENT_OTHER): Payer: BLUE CROSS/BLUE SHIELD | Attending: Cardiology | Admitting: Cardiovascular Disease

## 2018-03-08 ENCOUNTER — Ambulatory Visit (INDEPENDENT_AMBULATORY_CARE_PROVIDER_SITE_OTHER): Payer: BLUE CROSS/BLUE SHIELD | Admitting: Cardiology

## 2018-03-08 ENCOUNTER — Ambulatory Visit (INDEPENDENT_AMBULATORY_CARE_PROVIDER_SITE_OTHER): Payer: BLUE CROSS/BLUE SHIELD | Admitting: Pharmacist

## 2018-03-08 VITALS — Ht 74.0 in | Wt 250.0 lb

## 2018-03-08 VITALS — BP 136/100 | HR 87 | Ht 74.0 in | Wt 255.0 lb

## 2018-03-08 DIAGNOSIS — Z7901 Long term (current) use of anticoagulants: Secondary | ICD-10-CM

## 2018-03-08 DIAGNOSIS — I480 Paroxysmal atrial fibrillation: Secondary | ICD-10-CM

## 2018-03-08 DIAGNOSIS — G4730 Sleep apnea, unspecified: Secondary | ICD-10-CM | POA: Diagnosis not present

## 2018-03-08 DIAGNOSIS — I481 Persistent atrial fibrillation: Secondary | ICD-10-CM

## 2018-03-08 DIAGNOSIS — R072 Precordial pain: Secondary | ICD-10-CM

## 2018-03-08 DIAGNOSIS — G4719 Other hypersomnia: Secondary | ICD-10-CM | POA: Diagnosis not present

## 2018-03-08 DIAGNOSIS — I10 Essential (primary) hypertension: Secondary | ICD-10-CM

## 2018-03-08 DIAGNOSIS — G4733 Obstructive sleep apnea (adult) (pediatric): Secondary | ICD-10-CM

## 2018-03-08 DIAGNOSIS — I4819 Other persistent atrial fibrillation: Secondary | ICD-10-CM

## 2018-03-08 DIAGNOSIS — G478 Other sleep disorders: Secondary | ICD-10-CM

## 2018-03-08 LAB — POCT INR: INR: 2.6

## 2018-03-08 NOTE — Assessment & Plan Note (Signed)
He remains in AF though his rate is controlled

## 2018-03-08 NOTE — Assessment & Plan Note (Signed)
Recent chest pain-r/o CAD

## 2018-03-08 NOTE — Assessment & Plan Note (Signed)
Under better control.

## 2018-03-08 NOTE — Assessment & Plan Note (Signed)
Suspected sleep apnea by history- he has finally gotten his sleep study scheduled- it's tonight

## 2018-03-08 NOTE — Assessment & Plan Note (Signed)
CHA2Ds Vasc= 1. He is currently on Warfarin but hasn't had an INR in 6 weeks

## 2018-03-08 NOTE — Progress Notes (Signed)
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 He had been admitted in Dec 2017 with back pain and was hypertensive then. We think he has had atrial fibrillation in the past, his wife says they wanted him on Pradaxa 6 years ago in [place] but the pt was concerned about bleeding. In Dec 2017 when he was admitted he was in NSR.  The pt's wife also told us her  husband had a cath in [place] 6 years ago that showed normal coronaries.    When he came in early Sept 2018 his medications were adjusted and he was placed on anticoagulation. He underwent OP DCCV 09/04/17 but quickly went back into AF with RVR. The pt has a history of binge ETOH, 1PPD smoking, and uncontrolled B/P. He also reported he had been drinking a gallon of sweet tea a day.   We saw him in Nov and wanted to set him up for a sleep study, he reportedly "stops breathing several times a night". Unfortunately he had lost his insurance coverage. He is in the office now for follow up. He now has his [employer]. He was placed on Warfarin but hasn't had an INR since February. He is still smoking but has switched to decaf tea. His HR is under pretty good control for him- 118/88 by me. He also related to me a history of chest tightness that he had last week. He describes this as a "tightness" while he was cooking breakfast. He had to lay down. He admits to some exertional "tighness" since. No radiation down his ar or associated nausea vomiting or diaphoresis. He initially thought it was from resuming his "total body gym" but his symptoms don't seem to be positional.      Current Outpatient Medications  Medication Sig Dispense Refill  . albuterol (PROVENTIL HFA;VENTOLIN HFA) 108 (90 Base) MCG/ACT inhaler Inhale 2 puffs into the lungs every 6 (six) hours  as needed for wheezing or shortness of breath. 18 g 0  . cloNIDine (CATAPRES) 0.1 MG tablet Take 1 tablet (0.1 mg total) by mouth 2 (two) times daily. 60 tablet 3  . ePHEDrine-GuaiFENesin (BRONKAID) 25-400 MG TABS Take 1-2 tablets by mouth every 6 (six) hours as needed (for shortness of breath/congestion).     . metoprolol succinate (TOPROL-XL) 100 MG 24 hr tablet TAKE 1 TABLET BY MOUTH TWICE A DAY 60 tablet 6  . spironolactone (ALDACTONE) 25 MG tablet TAKE 1-2 TABLETS BY MOUTH DAILY AS DIRECTED 60 tablet 3  . warfarin (COUMADIN) 5 MG tablet Take 1 to 1 and 1/2 daily as ordered by coumadin clinic 60 tablet 0  . diltiazem (CARDIZEM CD) 240 MG 24 hr capsule Take 1 capsule (240 mg total) daily by mouth. 30 capsule 6   No current facility-administered medications for this visit.     No Known Allergies  Past Medical History:  Diagnosis Date  . ARF (acute renal failure) (HCC) 08/13/2017  . Arthritis   . History of hiatal hernia   . Hypertension   . Lung disease    pt states based on breathing tests at work  . Persistent atrial fibrillation with rapid ventricular response (HCC) 08/12/2017  . Pneumothorax   . Tobacco use 1973  . Tuberculosis   . Urinary incontinence  Social History   Socioeconomic History  . Marital status: Married    Spouse name: [name]  . Number of children: Not on file  . Years of education: 2615  . Highest education level: Not on file  Occupational History  . Occupation: Retail bankerAircraft Mechanic  Social Needs  . Financial resource strain: Not on file  . Food insecurity:    Worry: Not on file    Inability: Not on file  . Transportation needs:    Medical: Not on file    Non-medical: Not on file  Tobacco Use  . Smoking status: Current Every Day Smoker    Packs/day: 1.00    Years: 45.00    Pack years: 45.00    Types: Cigarettes  . Smokeless tobacco: Never Used  Substance and Sexual Activity  . Alcohol use: Yes    Alcohol/week: 7.2 oz    Types: 12 Cans of beer  per week  . Drug use: No  . Sexual activity: Yes    Partners: Female    Birth control/protection: None    Comment: married  Lifestyle  . Physical activity:    Days per week: Not on file    Minutes per session: Not on file  . Stress: Not on file  Relationships  . Social connections:    Talks on phone: Not on file    Gets together: Not on file    Attends religious service: Not on file    Active member of club or organization: Not on file    Attends meetings of clubs or organizations: Not on file    Relationship status: Not on file  . Intimate partner violence:    Fear of current or ex partner: Not on file    Emotionally abused: Not on file    Physically abused: Not on file    Forced sexual activity: Not on file  Other Topics Concern  . Not on file  Social History Narrative   married to [place].   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    Drinks caffeine, takes a daily vitamin.   Wears a seatbelt. Smoke detector in the home.   Wears dentures.   Feels safe in relationships.     Family History  Problem Relation Age of Onset  . Arthritis Mother   . Alcohol abuse Father   . Mental illness Father   . Early death Father   . Arthritis Brother   . Heart disease Brother   . Lymphoma Brother      Review of Systems: General: negative for chills, fever, night sweats or weight changes.  Cardiovascular: negative for chest pain, dyspnea on exertion, edema, orthopnea, palpitations, paroxysmal nocturnal dyspnea or shortness of breath Dermatological: negative for rash Respiratory: negative for cough or wheezing Urologic: negative for hematuria Abdominal: negative for nausea, vomiting, diarrhea, bright red blood per rectum, melena, or hematemesis Neurologic: negative for visual changes, syncope, or dizziness All other systems reviewed and are otherwise negative except as noted above.    Blood pressure (!) 136/100, pulse 87, height 6\' 2"  (1.88 m), weight 255 lb (115.7 kg).    General appearance: alert, cooperative, no distress and smells strongly of smoke Neck: no carotid bruit and no JVD Lungs: clear to auscultation bilaterally Heart: regular rate and rhythm Abdomen: obese, non tender Extremities: extremities normal, atraumatic, no cyanosis or edema Skin: Skin color, texture, turgor normal. No rashes or lesions Neurologic: Grossly normal  EKG AF with VR86  ASSESSMENT AND PLAN:   Chest pain with  moderate risk of acute coronary syndrome Recent chest pain-r/o CAD  Paroxysmal atrial fibrillation (HCC) He remains in AF though his rate is controlled   Hypertension Under better control  Chronic anticoagulation CHA2Ds Vasc= 1. He is currently on Warfarin but hasn't had an INR in 6 weeks  Sleep apnea Suspected sleep apnea by history- he has finally gotten his sleep study scheduled- it's tonight  Heavy cigarette smoker He says he is down to 1/2/ pack a day   PLAN  Check INR today and weekly for 3-4 weeks. . Schedule Lexiscan Myoview.  F/U 3-4 weeks. If he get on C-pap and his Myoview is low risk consider another attempt at OP DCCV.   Corine Shelter PA-C 03/08/2018 3:55 PM

## 2018-03-08 NOTE — Patient Instructions (Addendum)
Medication Instructions: Your physician recommends that you continue on your current medications as directed. Please refer to the Current Medication list given to you today.  STOP Lisinopril   Testing/Procedures: Your physician has requested that you have a lexiscan myoview. For further information please visit [email or web address]. Please follow instruction sheet, as given.  Follow-Up: Your physician recommends that you schedule a follow-up appointment in: 3-4 weeks with Dr. Antoine PocheHochrein or Corine ShelterLuke Kilroy, PA when Dr. Antoine PocheHochrein is in the office.

## 2018-03-08 NOTE — Assessment & Plan Note (Signed)
He says he is down to 1/2/ pack a day

## 2018-03-13 ENCOUNTER — Other Ambulatory Visit: Payer: Self-pay | Admitting: Cardiology

## 2018-03-18 ENCOUNTER — Telehealth (HOSPITAL_COMMUNITY): Payer: Self-pay

## 2018-03-18 NOTE — Telephone Encounter (Signed)
Encounter complete. 

## 2018-03-22 ENCOUNTER — Ambulatory Visit (INDEPENDENT_AMBULATORY_CARE_PROVIDER_SITE_OTHER): Payer: BLUE CROSS/BLUE SHIELD | Admitting: Pharmacist Clinician (PhC)/ Clinical Pharmacy Specialist

## 2018-03-22 DIAGNOSIS — Z7901 Long term (current) use of anticoagulants: Secondary | ICD-10-CM | POA: Diagnosis not present

## 2018-03-22 DIAGNOSIS — I481 Persistent atrial fibrillation: Secondary | ICD-10-CM

## 2018-03-22 DIAGNOSIS — I4819 Other persistent atrial fibrillation: Secondary | ICD-10-CM

## 2018-03-22 LAB — POCT INR: INR: 2.4

## 2018-03-22 NOTE — Patient Instructions (Signed)
Description   Continue taking 1.5 tables every Monday and Thursday and 5mg  all other days. Repeat INR in 1 week (weekly x 4 for cardioversion)

## 2018-03-23 ENCOUNTER — Ambulatory Visit (HOSPITAL_COMMUNITY)
Admission: RE | Admit: 2018-03-23 | Discharge: 2018-03-23 | Disposition: A | Discharge status: Home / Self Care | Payer: BLUE CROSS/BLUE SHIELD | Source: Ambulatory Visit | Attending: Cardiology | Admitting: Cardiology

## 2018-03-23 DIAGNOSIS — J438 Other emphysema: Secondary | ICD-10-CM | POA: Diagnosis not present

## 2018-03-23 DIAGNOSIS — F17200 Nicotine dependence, unspecified, uncomplicated: Secondary | ICD-10-CM | POA: Insufficient documentation

## 2018-03-23 DIAGNOSIS — E669 Obesity, unspecified: Secondary | ICD-10-CM | POA: Insufficient documentation

## 2018-03-23 DIAGNOSIS — E785 Hyperlipidemia, unspecified: Secondary | ICD-10-CM | POA: Diagnosis not present

## 2018-03-23 DIAGNOSIS — Z8249 Family history of ischemic heart disease and other diseases of the circulatory system: Secondary | ICD-10-CM | POA: Diagnosis not present

## 2018-03-23 DIAGNOSIS — R072 Precordial pain: Secondary | ICD-10-CM | POA: Diagnosis not present

## 2018-03-23 DIAGNOSIS — Z6832 Body mass index (BMI) 32.0-32.9, adult: Secondary | ICD-10-CM | POA: Diagnosis not present

## 2018-03-23 DIAGNOSIS — Z8611 Personal history of tuberculosis: Secondary | ICD-10-CM | POA: Insufficient documentation

## 2018-03-23 DIAGNOSIS — I10 Essential (primary) hypertension: Secondary | ICD-10-CM | POA: Insufficient documentation

## 2018-03-23 DIAGNOSIS — I4891 Unspecified atrial fibrillation: Secondary | ICD-10-CM | POA: Insufficient documentation

## 2018-03-23 LAB — MYOCARDIAL PERFUSION IMAGING
Peak HR: 100 {beats}/min
Rest HR: 70 {beats}/min
SDS: 2
SRS: 5
SSS: 7
TID: 1.09

## 2018-03-23 MED ORDER — AMINOPHYLLINE 25 MG/ML IV SOLN
75.0000 mg | Freq: Once | INTRAVENOUS | Status: AC
  Administered 2018-03-23: 75 mg via INTRAVENOUS

## 2018-03-23 MED ORDER — TECHNETIUM TC 99M TETROFOSMIN IV KIT
32.1000 | PACK | Freq: Once | INTRAVENOUS | Status: AC | PRN
  Administered 2018-03-23: 32.1 via INTRAVENOUS
  Filled 2018-03-23: qty 33

## 2018-03-23 MED ORDER — TECHNETIUM TC 99M TETROFOSMIN IV KIT
10.4000 | PACK | Freq: Once | INTRAVENOUS | Status: AC | PRN
  Administered 2018-03-23: 10.4 via INTRAVENOUS
  Filled 2018-03-23: qty 11

## 2018-03-23 MED ORDER — REGADENOSON 0.4 MG/5ML IV SOLN
0.4000 mg | Freq: Once | INTRAVENOUS | Status: AC
  Administered 2018-03-23: 0.4 mg via INTRAVENOUS

## 2018-03-26 ENCOUNTER — Telehealth: Payer: Self-pay | Admitting: Cardiology

## 2018-03-26 NOTE — Telephone Encounter (Signed)
New Message   Pt returning call for Lifescapeuke Kilroy

## 2018-03-26 NOTE — Telephone Encounter (Signed)
Called to discuss stress test- left message TCB  Chevie Birkhead PA-C 03/26/2018 1:01 PM

## 2018-03-26 NOTE — H&P (Addendum)
Cardiology Admission History and Physical:   Patient ID: [name]; MRN: [id number]; DOB: 07/11/1961   Admission date: (Not on file)  Primary Care Provider: Natalia Leatherwood, DO Primary Cardiologist: Dr Antoine Poche  Chief Complaint:  Chest pain  Patient Profile:   [name] is a 57 y.o. male, works as "an Naval architect", who we saw in early Sept 2018 with hypotension and AF with RVR. He had been admitted in Dec 2017 with back pain and was hypertensive then. We think he has had atrial fibrillation in the past, his wife says his doctors wanted him on Pradaxa 6 years ago in [place] but the pt was concerned about bleeding.The pt's wife also told us her  husband had a cath in [place] 6 years ago that showed normal coronaries.   When he came in early Sept 2018 his medications were adjusted and he was placed on anticoagulation. He underwent OP DCCV 09/04/17 but quickly went back into AF with RVR. The pt has a history of binge ETOH, 1PPD smoking, and uncontrolled B/P. He also reported he had been drinking a gallon of sweet tea a day.   We saw him in Nov 2018 and wanted to set him up for a sleep study, he reportedly "stops breathing several times a night". I saw him 03/08/18 and related a history of chest tightness that he had the previous week. He described this as a "tightness" while he was cooking breakfast. He had to lay down. He admits to some exertional "tighness" since. No radiation down his ar or associated nausea vomiting or diaphoresis. He initially thought it was from resuming his "total body gym" but his symptoms don't seem to be positional.  He had a Myoview 03/23/18 that was abnormal- showing a fairly large inferior defect. This was reviewed with Dr Antoine Poche. I discussed the results with the patient and the plan is to proceed with diagnostic cath. I suggested he hold his Coumadin starting 4/20. He is set up for OP cath 4/23.    Past Medical History:  Diagnosis Date  . ARF (acute  renal failure) (HCC) 08/13/2017  . Arthritis   . History of hiatal hernia   . Hypertension   . Lung disease    pt states based on breathing tests at work  . Persistent atrial fibrillation with rapid ventricular response (HCC) 08/12/2017  . Pneumothorax   . Tobacco use 1973  . Tuberculosis   . Urinary incontinence     Past Surgical History:  Procedure Laterality Date  . ADRENAL GLAND SURGERY     pt reports adrenal gland removed, he is uncertain which side.   Marland Kitchen CARDIAC CATHETERIZATION     pt describes a cardiac cath procedure as being completed, states it was "clear"  . CARDIOVERSION N/A 09/04/2017   Procedure: CARDIOVERSION;  Surgeon: Wendall Stade, MD;  Location: [facility] ENDOSCOPY;  Service: Cardiovascular;  Laterality: N/A;  . ESOPHAGOGASTRODUODENOSCOPY     pt states it was abnormal, but he does not know why  . HERNIA REPAIR    . NASAL HEMORRHAGE CONTROL N/A 02/22/2016   Procedure: EPISTAXIS CONTROL;  Surgeon: Serena Colonel, MD;  Location: [facility] OR;  Service: ENT;  Laterality: N/A;     Medications Prior to Admission: Prior to Admission medications   Medication Sig Start Date End Date Taking? Authorizing Provider  albuterol (PROVENTIL HFA;VENTOLIN HFA) 108 (90 Base) MCG/ACT inhaler Inhale 2 puffs into the lungs every 6 (six) hours as needed for wheezing or shortness of breath. 01/12/18  Rollene Rotunda, MD  cloNIDine (CATAPRES) 0.1 MG tablet Take 1 tablet (0.1 mg total) by mouth 2 (two) times daily. 01/07/18   Abelino Derrick, PA-C  diltiazem (CARDIZEM CD) 240 MG 24 hr capsule Take 1 capsule (240 mg total) daily by mouth. 10/13/17 01/11/18  Alv[place], [place], RPH-CPP  ePHEDrine-GuaiFENesin (BRONKAID) 25-400 MG TABS Take 1-2 tablets by mouth every 6 (six) hours as needed (for shortness of breath/congestion).     [provider]  metoprolol succinate (TOPROL-XL) 100 MG 24 hr tablet TAKE 1 TABLET BY MOUTH TWICE A DAY 11/18/17   Rollene Rotunda, MD  spironolactone (ALDACTONE) 25 MG tablet  TAKE 1-2 TABLETS BY MOUTH DAILY AS DIRECTED 03/03/18   Rollene Rotunda, MD  warfarin (COUMADIN) 5 MG tablet Take 1 to 1 and 1/2 tablets daily as ordered by coumadin clinic 03/15/18   Rollene Rotunda, MD     Allergies:   No Known Allergies  Social History:   Social History   Socioeconomic History  . Marital status: Married    Spouse name: Seward Grater  . Number of children: Not on file  . Years of education: 7  . Highest education level: Not on file  Occupational History  . Occupation: Retail banker  Social Needs  . Financial resource strain: Not on file  . Food insecurity:    Worry: Not on file    Inability: Not on file  . Transportation needs:    Medical: Not on file    Non-medical: Not on file  Tobacco Use  . Smoking status: Current Every Day Smoker    Packs/day: 1.00    Years: 45.00    Pack years: 45.00    Types: Cigarettes  . Smokeless tobacco: Never Used  Substance and Sexual Activity  . Alcohol use: Yes    Alcohol/week: 7.2 oz    Types: 12 Cans of beer per week  . Drug use: No  . Sexual activity: Yes    Partners: Female    Birth control/protection: None    Comment: married  Lifestyle  . Physical activity:    Days per week: Not on file    Minutes per session: Not on file  . Stress: Not on file  Relationships  . Social connections:    Talks on phone: Not on file    Gets together: Not on file    Attends religious service: Not on file    Active member of club or organization: Not on file    Attends meetings of clubs or organizations: Not on file    Relationship status: Not on file  . Intimate partner violence:    Fear of current or ex partner: Not on file    Emotionally abused: Not on file    Physically abused: Not on file    Forced sexual activity: Not on file  Other Topics Concern  . Not on file  Social History Narrative   married to [place].   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    Drinks caffeine, takes a daily vitamin.   Wears a seatbelt.  Smoke detector in the home.   Wears dentures.   Feels safe in relationships.    Family History:   The patient's family history includes Alcohol abuse in his father; Arthritis in his brother and mother; Early death in his father; Heart disease in his brother; Lymphoma in his brother; Mental illness in his father.    ROS:  Please see the history of present illness.  All other ROS reviewed  and negative.     Physical Exam/Data:   From 03/08/18- Blood pressure (!) 136/100, pulse 87, height 6\' 2"  (1.88 m), weight 255 lb (115.7 kg).  General appearance: alert, cooperative, no distress and smells strongly of smoke Neck: no carotid bruit and no JVD Lungs: clear to auscultation bilaterally Heart: regular rate and rhythm Abdomen: obese, non tender Extremities: extremities normal, atraumatic, no cyanosis or edema Skin: Skin color, texture, turgor normal. No rashes or lesions Neurologic: Grossly normal  EKG AF with VR86  Relevant CV Studies: Myoview 03/23/18-  There was no ST segment deviation noted during stress.  Defect 1: There is a large defect of moderate severity present in the basal inferior, mid inferior and apical inferior location. This defect is slightly worse at stress than at rest and may represent inferior infarct with peri-infarct ischemia.  Non-gated study, no ejection fraction calculated. Echocardiogram however in 2018 demonstrated EF of 75% with severe LVH.  Abnormal study, consider inferior defect.    Assessment and Plan:   Chest pain with moderate risk of acute coronary syndrome Recent chest pain-r/o CAD-abnormal Myoview 03/23/18  Paroxysmal atrial fibrillation (HCC) He remains in AF though his rate is controlled   Hypertension Under better control  Chronic anticoagulation CHA2Ds Vasc= 1. He is currently on Warfarin. To be held starting 03/27/18.  Sleep apnea Suspected sleep apnea by history- he has finally gotten his sleep study scheduled- it's  tonight  Heavy cigarette smoker He says he is down to 1/2/ pack a day  Plan: diagnostic cath 03/30/18. Unfortunately this will entail an interuption in his Coumadin so his cardioversion will have to be put off pending his cath results. STAT labs to be ordered on admission.   Severity of Illness: The appropriate patient status for this patient is OBSERVATION. Observation status is judged to be reasonable and necessary in order to provide the required intensity of service to ensure the patient's safety. The patient's presenting symptoms, physical exam findings, and initial radiographic and laboratory data in the context of their medical condition is felt to place them at decreased risk for further clinical deterioration. Furthermore, it is anticipated that the patient will be medically stable for discharge from the hospital within 2 midnights of admission. The following factors support the patient status of observation.   " The patient's presenting symptoms include chest pain. " The physical exam findings include clear. " The initial radiographic and laboratory data are abnormal Myoview.     For questions or updates, please contact [facility] Please consult [email or web address] for contact info under Cardiology/STEMI.    Jolene ProvostSigned, Luke Kilroy, PA-C  03/26/2018 4:50 PM

## 2018-03-26 NOTE — Telephone Encounter (Signed)
Pt schedule for left heart cath 04/23 @ 2:00 pm

## 2018-03-26 NOTE — Telephone Encounter (Signed)
Discussed coronary angiogram with patient. He is agreeable to proceed. The patient understands that risks included but are not limited to stroke (1 in 1000), death (1 in 1000), kidney failure [usually temporary] (1 in 500), bleeding (1 in 200), allergic reaction [possibly serious] (1 in 200).    I suggested he take his Coumadin tonight but hold it Saturday and Sunday evening. We can contact him Monday about timing of cath.  Corine ShelterLUKE Cayman Brogden PA-C 03/26/2018 4:27 PM

## 2018-03-29 ENCOUNTER — Telehealth: Payer: Self-pay | Admitting: *Deleted

## 2018-03-29 NOTE — Telephone Encounter (Addendum)
[facility]: Tuesday April 23,2019 2 PM [facility] and [facility] at: 12 noon No solid food after midnight, clear liquids until 5 AM.  Hold: Warfarin 03/27/18 until post cath Spironolactone AM of procedure.  Except hold medications AM meds can be  taken pre-cath with sip of water including: ASA 81 mg  Confirm patient has responsible person to drive home post procedure and observe patient for 24 hours:yes

## 2018-03-30 ENCOUNTER — Other Ambulatory Visit: Payer: Self-pay

## 2018-03-30 ENCOUNTER — Encounter (HOSPITAL_COMMUNITY): Payer: Self-pay | Admitting: General Practice

## 2018-03-30 ENCOUNTER — Ambulatory Visit (HOSPITAL_COMMUNITY)
Admission: RE | Disposition: A | Discharge status: Home / Self Care | Payer: Self-pay | Source: Ambulatory Visit | Attending: Cardiovascular Disease

## 2018-03-30 ENCOUNTER — Ambulatory Visit (HOSPITAL_COMMUNITY)
Admission: RE | Admit: 2018-03-30 | Discharge: 2018-03-31 | Disposition: A | Discharge status: Home / Self Care | Payer: BLUE CROSS/BLUE SHIELD | Source: Ambulatory Visit | Attending: Cardiovascular Disease | Admitting: Cardiovascular Disease

## 2018-03-30 DIAGNOSIS — Z955 Presence of coronary angioplasty implant and graft: Secondary | ICD-10-CM

## 2018-03-30 DIAGNOSIS — I200 Unstable angina: Secondary | ICD-10-CM | POA: Diagnosis present

## 2018-03-30 DIAGNOSIS — I10 Essential (primary) hypertension: Secondary | ICD-10-CM | POA: Diagnosis not present

## 2018-03-30 DIAGNOSIS — Z8249 Family history of ischemic heart disease and other diseases of the circulatory system: Secondary | ICD-10-CM | POA: Insufficient documentation

## 2018-03-30 DIAGNOSIS — I481 Persistent atrial fibrillation: Secondary | ICD-10-CM | POA: Diagnosis not present

## 2018-03-30 DIAGNOSIS — Z7901 Long term (current) use of anticoagulants: Secondary | ICD-10-CM | POA: Insufficient documentation

## 2018-03-30 DIAGNOSIS — G4730 Sleep apnea, unspecified: Secondary | ICD-10-CM | POA: Diagnosis not present

## 2018-03-30 DIAGNOSIS — I2510 Atherosclerotic heart disease of native coronary artery without angina pectoris: Secondary | ICD-10-CM

## 2018-03-30 DIAGNOSIS — F17210 Nicotine dependence, cigarettes, uncomplicated: Secondary | ICD-10-CM | POA: Insufficient documentation

## 2018-03-30 DIAGNOSIS — Z9861 Coronary angioplasty status: Secondary | ICD-10-CM

## 2018-03-30 DIAGNOSIS — I25110 Atherosclerotic heart disease of native coronary artery with unstable angina pectoris: Secondary | ICD-10-CM | POA: Diagnosis not present

## 2018-03-30 HISTORY — DX: Emphysema, unspecified: J43.9

## 2018-03-30 HISTORY — PX: CORONARY STENT INTERVENTION: CATH118234

## 2018-03-30 HISTORY — DX: Personal history of other diseases of the musculoskeletal system and connective tissue: Z87.39

## 2018-03-30 HISTORY — PX: LEFT HEART CATH AND CORONARY ANGIOGRAPHY: CATH118249

## 2018-03-30 HISTORY — DX: Unspecified chronic bronchitis: J42

## 2018-03-30 HISTORY — PX: CORONARY ANGIOPLASTY WITH STENT PLACEMENT: SHX49

## 2018-03-30 HISTORY — DX: Nonspecific reaction to tuberculin skin test without active tuberculosis: R76.11

## 2018-03-30 HISTORY — DX: Anemia, unspecified: D64.9

## 2018-03-30 LAB — BASIC METABOLIC PANEL
Anion gap: 9 (ref 5–15)
BUN: 11 mg/dL (ref 6–20)
CO2: 23 mmol/L (ref 22–32)
Calcium: 9.1 mg/dL (ref 8.9–10.3)
Chloride: 107 mmol/L (ref 101–111)
Creatinine, Ser: 0.98 mg/dL (ref 0.61–1.24)
GFR calc Af Amer: >60 mL/min (ref >60)
GFR calc non Af Amer: >60 mL/min (ref >60)
Glucose, Bld: 93 mg/dL (ref 65–99)
Potassium: 4.3 mmol/L (ref 3.5–5.1)
Sodium: 139 mmol/L (ref 135–145)

## 2018-03-30 LAB — CBC
HCT: 44.7 % (ref 39.0–52.0)
Hemoglobin: 14.9 g/dL (ref 13.0–17.0)
MCH: 30.7 pg (ref 26.0–34.0)
MCHC: 33.3 g/dL (ref 30.0–36.0)
MCV: 92 fL (ref 78.0–100.0)
Platelets: 158 10*3/uL (ref 150–400)
RBC: 4.86 MIL/uL (ref 4.22–5.81)
RDW: 14.4 % (ref 11.5–15.5)
WBC: 8.7 10*3/uL (ref 4.0–10.5)

## 2018-03-30 LAB — PROTIME-INR
INR: 1.27
Prothrombin Time: 15.7 seconds — ABNORMAL HIGH (ref 11.4–15.2)

## 2018-03-30 LAB — POCT ACTIVATED CLOTTING TIME
ACTIVATED CLOTTING TIME: 400 s
Activated Clotting Time: 263 seconds

## 2018-03-30 SURGERY — LEFT HEART CATH AND CORONARY ANGIOGRAPHY
Anesthesia: LOCAL

## 2018-03-30 MED ORDER — FENTANYL CITRATE (PF) 100 MCG/2ML IJ SOLN
INTRAMUSCULAR | Status: AC
  Filled 2018-03-30: qty 2

## 2018-03-30 MED ORDER — VERAPAMIL HCL 2.5 MG/ML IV SOLN
INTRAVENOUS | Status: DC | PRN
  Administered 2018-03-30: 15:00:00 via INTRA_ARTERIAL

## 2018-03-30 MED ORDER — FENTANYL CITRATE (PF) 100 MCG/2ML IJ SOLN
INTRAMUSCULAR | Status: DC | PRN
  Administered 2018-03-30: 50 ug via INTRAVENOUS
  Administered 2018-03-30: 25 ug via INTRAVENOUS

## 2018-03-30 MED ORDER — LIDOCAINE HCL (PF) 1 % IJ SOLN
INTRAMUSCULAR | Status: DC | PRN
  Administered 2018-03-30: 2 mL

## 2018-03-30 MED ORDER — DILTIAZEM HCL ER COATED BEADS 240 MG PO CP24
240.0000 mg | ORAL_CAPSULE | Freq: Every day | ORAL | Status: DC
  Administered 2018-03-30: 240 mg via ORAL
  Filled 2018-03-30: qty 1

## 2018-03-30 MED ORDER — LABETALOL HCL 5 MG/ML IV SOLN: 10.0000 mg | INTRAVENOUS | Status: AC | PRN

## 2018-03-30 MED ORDER — CLOPIDOGREL BISULFATE 75 MG PO TABS
75.0000 mg | ORAL_TABLET | Freq: Every day | ORAL | Status: DC
  Administered 2018-03-31: 10:00:00 75 mg via ORAL
  Filled 2018-03-30: qty 1

## 2018-03-30 MED ORDER — FAMOTIDINE IN NACL 20-0.9 MG/50ML-% IV SOLN
INTRAVENOUS | Status: AC
  Filled 2018-03-30: qty 50

## 2018-03-30 MED ORDER — CLOPIDOGREL BISULFATE 300 MG PO TABS
ORAL_TABLET | ORAL | Status: DC | PRN
  Administered 2018-03-30: 600 mg via ORAL

## 2018-03-30 MED ORDER — LIDOCAINE HCL (PF) 1 % IJ SOLN
INTRAMUSCULAR | Status: AC
  Filled 2018-03-30: qty 30

## 2018-03-30 MED ORDER — WARFARIN - PHYSICIAN DOSING INPATIENT
Freq: Every day | Status: DC
  Administered 2018-03-30: 20:00:00

## 2018-03-30 MED ORDER — ASPIRIN 81 MG PO CHEW
81.0000 mg | CHEWABLE_TABLET | ORAL | Status: DC
Start: 2018-03-31 — End: 2018-03-30

## 2018-03-30 MED ORDER — CLOPIDOGREL BISULFATE 300 MG PO TABS
ORAL_TABLET | ORAL | Status: AC
  Filled 2018-03-30: qty 2

## 2018-03-30 MED ORDER — FAMOTIDINE IN NACL 20-0.9 MG/50ML-% IV SOLN
INTRAVENOUS | Status: AC | PRN
  Administered 2018-03-30: 20 mg via INTRAVENOUS

## 2018-03-30 MED ORDER — HEPARIN SODIUM (PORCINE) 1000 UNIT/ML IJ SOLN
INTRAMUSCULAR | Status: AC
  Filled 2018-03-30: qty 1

## 2018-03-30 MED ORDER — SODIUM CHLORIDE 0.9% FLUSH: 3.0000 mL | INTRAVENOUS | Status: DC | PRN

## 2018-03-30 MED ORDER — MIDAZOLAM HCL 2 MG/2ML IJ SOLN
INTRAMUSCULAR | Status: AC
  Filled 2018-03-30: qty 2

## 2018-03-30 MED ORDER — HEPARIN (PORCINE) IN NACL 1000-0.9 UT/500ML-% IV SOLN
INTRAVENOUS | Status: AC
  Filled 2018-03-30: qty 500

## 2018-03-30 MED ORDER — ACETAMINOPHEN 325 MG PO TABS: 650.0000 mg | ORAL_TABLET | ORAL | Status: DC | PRN

## 2018-03-30 MED ORDER — SODIUM CHLORIDE 0.9 % WEIGHT BASED INFUSION
3.0000 mL/kg/h | INTRAVENOUS | Status: DC
  Administered 2018-03-30: 3 mL/kg/h via INTRAVENOUS

## 2018-03-30 MED ORDER — SPIRONOLACTONE 25 MG PO TABS
25.0000 mg | ORAL_TABLET | Freq: Two times a day (BID) | ORAL | Status: DC
  Administered 2018-03-30 – 2018-03-31 (×2): 25 mg via ORAL
  Filled 2018-03-30 (×2): qty 1

## 2018-03-30 MED ORDER — HEPARIN SODIUM (PORCINE) 1000 UNIT/ML IJ SOLN
INTRAMUSCULAR | Status: DC | PRN
  Administered 2018-03-30: 4000 [IU] via INTRAVENOUS
  Administered 2018-03-30: 6000 [IU] via INTRAVENOUS
  Administered 2018-03-30: 3000 [IU] via INTRAVENOUS

## 2018-03-30 MED ORDER — ASPIRIN 81 MG PO CHEW
81.0000 mg | CHEWABLE_TABLET | Freq: Every day | ORAL | Status: DC
  Administered 2018-03-31: 81 mg via ORAL
  Filled 2018-03-30: qty 1

## 2018-03-30 MED ORDER — METOPROLOL SUCCINATE ER 25 MG PO TB24
100.0000 mg | ORAL_TABLET | Freq: Two times a day (BID) | ORAL | Status: DC
Start: 2018-03-30 — End: 2018-03-31
  Administered 2018-03-30 – 2018-03-31 (×2): 100 mg via ORAL
  Filled 2018-03-30 (×2): qty 4

## 2018-03-30 MED ORDER — CLONIDINE HCL 0.1 MG PO TABS
0.1000 mg | ORAL_TABLET | Freq: Two times a day (BID) | ORAL | Status: DC
  Administered 2018-03-30: 0.1 mg via ORAL
  Filled 2018-03-30 (×2): qty 1

## 2018-03-30 MED ORDER — SODIUM CHLORIDE 0.9 % IV SOLN: 250.0000 mL | INTRAVENOUS | Status: DC | PRN

## 2018-03-30 MED ORDER — SODIUM CHLORIDE 0.9 % IV SOLN
INTRAVENOUS | Status: AC
  Administered 2018-03-30: 17:00:00 via INTRAVENOUS

## 2018-03-30 MED ORDER — EPHEDRINE-GUAIFENESIN 25-400 MG PO TABS
1.0000 | ORAL_TABLET | Freq: Four times a day (QID) | ORAL | Status: DC | PRN
  Filled 2018-03-30: qty 1

## 2018-03-30 MED ORDER — MIDAZOLAM HCL 2 MG/2ML IJ SOLN
INTRAMUSCULAR | Status: DC | PRN
  Administered 2018-03-30: 1 mg via INTRAVENOUS
  Administered 2018-03-30: 2 mg via INTRAVENOUS

## 2018-03-30 MED ORDER — CLONIDINE HCL 0.2 MG PO TABS
0.1000 mg | ORAL_TABLET | Freq: Two times a day (BID) | ORAL | Status: DC
Start: 2018-03-30 — End: 2018-03-30
  Filled 2018-03-30: qty 0.5

## 2018-03-30 MED ORDER — HYDRALAZINE HCL 20 MG/ML IJ SOLN: 5.0000 mg | INTRAMUSCULAR | Status: AC | PRN

## 2018-03-30 MED ORDER — HEPARIN (PORCINE) IN NACL 2-0.9 UNITS/ML
INTRAMUSCULAR | Status: AC | PRN
  Administered 2018-03-30 (×2): 500 mL via INTRA_ARTERIAL

## 2018-03-30 MED ORDER — WARFARIN SODIUM 5 MG PO TABS
5.0000 mg | ORAL_TABLET | Freq: Every day | ORAL | Status: AC
  Administered 2018-03-30: 20:00:00 5 mg via ORAL
  Filled 2018-03-30: qty 1

## 2018-03-30 MED ORDER — ONDANSETRON HCL 4 MG/2ML IJ SOLN
4.0000 mg | Freq: Four times a day (QID) | INTRAMUSCULAR | Status: DC | PRN
Start: 2018-03-30 — End: 2018-03-31

## 2018-03-30 MED ORDER — ANGIOPLASTY BOOK
Freq: Once | Status: AC
  Administered 2018-03-31: 06:00:00
  Filled 2018-03-30: qty 1

## 2018-03-30 MED ORDER — VERAPAMIL HCL 2.5 MG/ML IV SOLN
INTRAVENOUS | Status: AC
  Filled 2018-03-30: qty 2

## 2018-03-30 MED ORDER — IOHEXOL 350 MG/ML SOLN
INTRAVENOUS | Status: DC | PRN
  Administered 2018-03-30: 160 mL via INTRA_ARTERIAL

## 2018-03-30 MED ORDER — SODIUM CHLORIDE 0.9% FLUSH
3.0000 mL | Freq: Two times a day (BID) | INTRAVENOUS | Status: DC
  Administered 2018-03-30: 21:00:00 3 mL via INTRAVENOUS

## 2018-03-30 MED ORDER — SODIUM CHLORIDE 0.9 % WEIGHT BASED INFUSION: 1.0000 mL/kg/h | INTRAVENOUS | Status: DC

## 2018-03-30 MED ORDER — ASPIRIN EC 81 MG PO TBEC: 81.0000 mg | DELAYED_RELEASE_TABLET | Freq: Once | ORAL | Status: DC

## 2018-03-30 MED ORDER — ALBUTEROL SULFATE (2.5 MG/3ML) 0.083% IN NEBU
3.0000 mL | INHALATION_SOLUTION | Freq: Four times a day (QID) | RESPIRATORY_TRACT | Status: DC | PRN
  Administered 2018-03-30: 3 mL via RESPIRATORY_TRACT
  Filled 2018-03-30: qty 3

## 2018-03-30 SURGICAL SUPPLY — 20 items
BALLN EUPHORA RX 2.5X12 (BALLOONS) ×2
BALLOON EUPHORA RX 2.5X12 (BALLOONS) IMPLANT
CATH INFINITI 5 FR JL3.5 (CATHETERS) ×2 IMPLANT
CATH INFINITI 5FR ANG PIGTAIL (CATHETERS) ×1 IMPLANT
CATH INFINITI JR4 5F (CATHETERS) ×2 IMPLANT
CATH VISTA GUIDE 6FR XB3 (CATHETERS) ×2 IMPLANT
DEVICE RAD COMP TR BAND LRG (VASCULAR PRODUCTS) ×2 IMPLANT
GUIDEWIRE INQWIRE 1.5J.035X260 (WIRE) ×1 IMPLANT
INQWIRE 1.5J .035X260CM (WIRE) ×2
KIT ENCORE 26 ADVANTAGE (KITS) ×2 IMPLANT
KIT HEART LEFT (KITS) ×2 IMPLANT
KIT HEMO VALVE WATCHDOG (MISCELLANEOUS) ×1 IMPLANT
NEEDLE PERC 21GX4CM (NEEDLE) ×2 IMPLANT
PACK CARDIAC CATHETERIZATION (CUSTOM PROCEDURE TRAY) ×2 IMPLANT
SHEATH RAIN RADIAL 21G 6FR (SHEATH) ×2 IMPLANT
STENT RESOLUTE ONYX 2.75X15 (Permanent Stent) ×2 IMPLANT
TRANSDUCER W/STOPCOCK (MISCELLANEOUS) ×2 IMPLANT
TUBING CIL FLEX 10 FLL-RA (TUBING) ×2 IMPLANT
WIRE COUGAR XT STRL 190CM (WIRE) ×2 IMPLANT
WIRE HI TORQ VERSACORE-J 145CM (WIRE) ×2 IMPLANT

## 2018-03-30 NOTE — Plan of Care (Signed)
  Problem: Education: Goal: Knowledge of General Education information will improve Outcome: Progressing   Problem: Education: Goal: Understanding of CV disease, CV risk reduction, and recovery process will improve Outcome: Progressing   Problem: Activity: Goal: Ability to return to baseline activity level will improve Outcome: Progressing   Problem: Cardiovascular: Goal: Ability to achieve and maintain adequate cardiovascular perfusion will improve Outcome: Progressing Goal: Vascular access site(s) Level 0-1 will be maintained Outcome: Progressing   Problem: Health Behavior/Discharge Planning: Goal: Ability to safely manage health-related needs after discharge will improve Outcome: Progressing   

## 2018-03-30 NOTE — Interval H&P Note (Signed)
History and Physical Interval Note:  03/30/2018 3:02 PM  [name]  has presented today for surgery, with the diagnosis of abnormal stress test  The various methods of treatment have been discussed with the patient and family. After consideration of risks, benefits and other options for treatment, the patient has consented to  Procedure(s): LEFT HEART CATH AND CORONARY ANGIOGRAPHY (N/A) as a surgical intervention .  The patient's history has been reviewed, patient examined, no change in status, stable for surgery.  I have reviewed the patient's chart and labs.  Questions were answered to the patient's satisfaction.    Cath Lab Visit (complete for each Cath Lab visit)  Clinical Evaluation Leading to the Procedure:   ACS: No.  Non-ACS:    Anginal Classification: CCS III  Anti-ischemic medical therapy: Maximal Therapy (2 or more classes of medications)  Non-Invasive Test Results: Intermediate-risk stress test findings: cardiac mortality 1-3%/year  Prior CABG: No previous CABG          Verne Carrowhristopher McAlhany

## 2018-03-31 ENCOUNTER — Telehealth: Payer: Self-pay | Admitting: *Deleted

## 2018-03-31 ENCOUNTER — Encounter (HOSPITAL_COMMUNITY): Payer: Self-pay | Admitting: Cardiovascular Disease

## 2018-03-31 DIAGNOSIS — G4730 Sleep apnea, unspecified: Secondary | ICD-10-CM | POA: Diagnosis not present

## 2018-03-31 DIAGNOSIS — Z7901 Long term (current) use of anticoagulants: Secondary | ICD-10-CM | POA: Diagnosis not present

## 2018-03-31 DIAGNOSIS — I200 Unstable angina: Secondary | ICD-10-CM

## 2018-03-31 DIAGNOSIS — E785 Hyperlipidemia, unspecified: Secondary | ICD-10-CM

## 2018-03-31 DIAGNOSIS — I481 Persistent atrial fibrillation: Secondary | ICD-10-CM

## 2018-03-31 DIAGNOSIS — F17210 Nicotine dependence, cigarettes, uncomplicated: Secondary | ICD-10-CM | POA: Diagnosis not present

## 2018-03-31 DIAGNOSIS — I10 Essential (primary) hypertension: Secondary | ICD-10-CM | POA: Diagnosis not present

## 2018-03-31 DIAGNOSIS — Z8249 Family history of ischemic heart disease and other diseases of the circulatory system: Secondary | ICD-10-CM | POA: Diagnosis not present

## 2018-03-31 DIAGNOSIS — I25110 Atherosclerotic heart disease of native coronary artery with unstable angina pectoris: Secondary | ICD-10-CM | POA: Diagnosis not present

## 2018-03-31 LAB — BASIC METABOLIC PANEL
Anion gap: 10 (ref 5–15)
BUN: 15 mg/dL (ref 6–20)
CALCIUM: 9.1 mg/dL (ref 8.9–10.3)
CO2: 23 mmol/L (ref 22–32)
Chloride: 104 mmol/L (ref 101–111)
Creatinine, Ser: 1.07 mg/dL (ref 0.61–1.24)
Glucose, Bld: 96 mg/dL (ref 65–99)
Potassium: 4.1 mmol/L (ref 3.5–5.1)
SODIUM: 137 mmol/L (ref 135–145)

## 2018-03-31 LAB — PROTIME-INR
INR: 1.17
PROTHROMBIN TIME: 14.8 s (ref 11.4–15.2)

## 2018-03-31 LAB — CBC
HCT: 45.5 % (ref 39.0–52.0)
HEMOGLOBIN: 15 g/dL (ref 13.0–17.0)
MCH: 30.4 pg (ref 26.0–34.0)
MCHC: 33 g/dL (ref 30.0–36.0)
MCV: 92.3 fL (ref 78.0–100.0)
Platelets: 154 10*3/uL (ref 150–400)
RBC: 4.93 MIL/uL (ref 4.22–5.81)
RDW: 14.4 % (ref 11.5–15.5)
WBC: 8.6 10*3/uL (ref 4.0–10.5)

## 2018-03-31 LAB — HEPATIC FUNCTION PANEL
ALBUMIN: 3.6 g/dL (ref 3.5–5.0)
ALT: 23 U/L (ref 17–63)
AST: 18 U/L (ref 15–41)
Alkaline Phosphatase: 47 U/L (ref 38–126)
Bilirubin, Direct: 0.1 mg/dL (ref 0.1–0.5)
Indirect Bilirubin: 0.5 mg/dL (ref 0.3–0.9)
Total Bilirubin: 0.6 mg/dL (ref 0.3–1.2)
Total Protein: 6.1 g/dL — ABNORMAL LOW (ref 6.5–8.1)

## 2018-03-31 MED ORDER — LOSARTAN POTASSIUM 50 MG PO TABS
50.0000 mg | ORAL_TABLET | Freq: Every day | ORAL | Status: DC
  Administered 2018-03-31: 10:00:00 50 mg via ORAL
  Filled 2018-03-31: qty 1

## 2018-03-31 MED ORDER — ACETAMINOPHEN 500 MG PO TABS: 500.0000 mg | ORAL_TABLET | Freq: Every day | ORAL | 0 refills | Status: AC | PRN

## 2018-03-31 MED ORDER — PANTOPRAZOLE SODIUM 40 MG PO TBEC: 40.0000 mg | DELAYED_RELEASE_TABLET | Freq: Every day | ORAL | 5 refills | Status: DC

## 2018-03-31 MED ORDER — LOSARTAN POTASSIUM 50 MG PO TABS: 50.0000 mg | ORAL_TABLET | Freq: Every day | ORAL | 5 refills | Status: DC

## 2018-03-31 MED ORDER — ASPIRIN EC 81 MG PO TBEC: 81.0000 mg | DELAYED_RELEASE_TABLET | Freq: Once | ORAL | 0 refills | Status: AC

## 2018-03-31 MED ORDER — ATORVASTATIN CALCIUM 80 MG PO TABS: 80.0000 mg | ORAL_TABLET | Freq: Every day | ORAL | Status: DC

## 2018-03-31 MED ORDER — ATORVASTATIN CALCIUM 80 MG PO TABS: 80.0000 mg | ORAL_TABLET | Freq: Every day | ORAL | 5 refills | Status: DC

## 2018-03-31 MED ORDER — CLOPIDOGREL BISULFATE 75 MG PO TABS: 75.0000 mg | ORAL_TABLET | Freq: Every day | ORAL | 11 refills | Status: DC

## 2018-03-31 MED ORDER — NITROGLYCERIN 0.4 MG SL SUBL: 0.4000 mg | SUBLINGUAL_TABLET | SUBLINGUAL | 2 refills | Status: DC | PRN

## 2018-03-31 MED ORDER — NITROGLYCERIN 0.4 MG SL SUBL: 0.4000 mg | SUBLINGUAL_TABLET | SUBLINGUAL | Status: DC | PRN

## 2018-03-31 MED ORDER — PANTOPRAZOLE SODIUM 40 MG PO TBEC
40.0000 mg | DELAYED_RELEASE_TABLET | Freq: Every day | ORAL | Status: DC
  Administered 2018-03-31: 40 mg via ORAL
  Filled 2018-03-31: qty 1

## 2018-03-31 MED FILL — Heparin Sod (Porcine)-NaCl IV Soln 1000 Unit/500ML-0.9%: INTRAVENOUS | Qty: 1000 | Status: AC

## 2018-03-31 NOTE — Progress Notes (Signed)
CARDIAC REHAB PHASE I   PRE:  Rate/Rhythm: 72 afib    BP: sitting 161/113    SaO2:   MODE:  Ambulation: 460 ft   POST:  Rate/Rhythm: 81 afib    BP: sitting 163/110     SaO2:   Pt HR ok, no CP however he was SOB with distance. He sts this is normal for him, prob due to his emphysema. BP very elevated. Ed completed with pt and wife. Voiced understanding regarding Plavix/ASA/Coumadin. We discussed smoking cessation importance but he seems to want to make his own plan, which I encouraged. Gave information. Wife smokes too and they concur that they need to quit together. Wife accepted fake cigarette for the pt.  Discussed CRPII, pt is not interested however I will put in referral.  [id number]  Harriet Massonandi Kristan Persia Lintner CES, ACSM 03/31/2018 10:01 AM

## 2018-03-31 NOTE — Procedures (Signed)
Patient Name: [name], [name] Date: 03/08/2018 Gender: Male D.O.B: February 25, 1961 Age (years): 56 Referring Provider: Corine Shelter Height (inches): 74 Interpreting Physician: Nicki Guadalajara MD, ABSM Weight (lbs): 250 RPSGT: Armen Pickup BMI: 32 MRN: [id number] Neck Size: 17.50  CLINICAL INFORMATION Sleep Study Type: NPSG  Indication for sleep study: Excessive Daytime Sleepiness, OSA ??????? Epworth Sleepiness Score: 7  SLEEP STUDY TECHNIQUE As per the AASM Manual for the Scoring of Sleep and Associated Events v2.3 (April 2016) with a hypopnea requiring 4% desaturations.  The channels recorded and monitored were frontal, central and occipital EEG, electrooculogram (EOG), submentalis EMG (chin), nasal and oral airflow, thoracic and abdominal wall motion, anterior tibialis EMG, snore microphone, electrocardiogram, and pulse oximetry.  MEDICATIONS     0.9 % sodium chloride infusion         acetaminophen (TYLENOL) tablet 650 mg         albuterol (PROVENTIL) (2.5 MG/3ML) 0.083% nebulizer solution 3 mL         aspirin chewable tablet 81 mg         cloNIDine (CATAPRES) tablet 0.1 mg         clopidogrel (PLAVIX) tablet 75 mg         diltiazem (CARDIZEM CD) 24 hr capsule 240 mg         ePHEDrine-guaiFENesin 25-400 MG TABS 1 tablet         metoprolol succinate (TOPROL-XL) 24 hr tablet 100 mg         ondansetron (ZOFRAN) injection 4 mg         sodium chloride flush (NS) 0.9 % injection 3 mL         sodium chloride flush (NS) 0.9 % injection 3 mL         spironolactone (ALDACTONE) tablet 25 mg         Warfarin - Physician Dosing Inpatient     Outpatient Medications     acetaminophen (TYLENOL) 500 MG tablet         albuterol (PROVENTIL HFA;VENTOLIN HFA) 108 (90 Base) MCG/ACT inhaler         aspirin EC 81 MG tablet         cloNIDine (CATAPRES) 0.1 MG tablet         diltiazem (CARDIZEM CD) 240 MG 24 hr capsule (Expired)         ePHEDrine-GuaiFENesin (BRONKAID) 25-400 MG TABS           metoprolol succinate (TOPROL-XL) 100 MG 24 hr tablet         spironolactone (ALDACTONE) 25 MG tablet         warfarin (COUMADIN) 5 MG tablet      Medications self-administered by patient taken the night of the study : CATAPRES, DILTIAZEM, TOPROL-XL, ALDACTONE, WARFARIN  SLEEP ARCHITECTURE The study was initiated at 10:39:02 PM and ended at 4:29:55 AM.  Sleep onset time was 5.2 minutes and the sleep efficiency was 73.5%%. The total sleep time was 258.0 minutes.  Stage REM latency was 89.5 minutes.  The patient spent 11.6%% of the night in stage N1 sleep, 66.1%% in stage N2 sleep, 0.0%% in stage N3 and 22.29% in REM.  Alpha intrusion was absent.  Supine sleep was 13.17%.  RESPIRATORY PARAMETERS The overall apnea/hypopnea index (AHI) was 0.7 per hour. The respiratory index (RDI) was 12.8/h. There were 0 total apneas, including 0 obstructive, 0 central and 0 mixed apneas. There were 3 hypopneas and 52 RERAs.  The AHI during Stage REM sleep  was 1.0 per hour.  AHI while supine was 0.0 per hour.  The mean oxygen saturation was 93.7%. The minimum SpO2 during sleep was 88.0%.  Soft snoring was noted during this study.  CARDIAC DATA The 2 lead EKG demonstrated sinus rhythm. The mean heart rate was 86.7 beats per minute. Other EKG findings include: Atrial Fibrillation . LEG MOVEMENT DATA The total PLMS were 0 with a resulting PLMS index of 0.0. Associated arousal with leg movement index was 0.0 .  IMPRESSIONS - No significant obstructive sleep apnea (AHI 0.7/h), however, the arousal index was abnormal and RDI was 12.8/h. - No significant central sleep apnea occurred during this study (CAI = 0.0/h). - The patient had minimal oxygen desaturation to a nadir of  88%. - Mildly reduced sleep efficiency. - The patient snored with soft snoring volume. - EKG findings include Atrial Fibrillation. - Clinically significant periodic limb movements did not occur during sleep. No significant  associated arousals.  DIAGNOSIS - Increased upper airway resistance  - Nocturnal Hypoxemia (327.26 [G47.36 ICD-10])  RECOMMENDATIONS - At present patient does not meet criteria for CPAP.  - Efforts should be made to maximize nasal and oropharyngeal patency. - Avoid alcohol, sedatives and other CNS depressants that may worsen sleep apnea and disrupt normal sleep architecture. - Sleep hygiene should be reviewed to assess factors that may improve sleep quality. - Weight management (BMI 32) and regular exercise should be initiated or continued if appropriate.  [Electronically signed] 03/31/2018 07:25 AM  Nicki Guadalajarahomas Kelly MD, [facility] Hospital[facility]ACC, ABSM Diplomate, American Board of Sleep Medicine   NPI: [id number] [facility] PH: [phone]   FX: [phone] ACCREDITED BY THE AMERICAN ACADEMY OF SLEEP MEDICINE

## 2018-03-31 NOTE — Progress Notes (Signed)
03/31/18 left message to return a call to discuss.

## 2018-03-31 NOTE — Telephone Encounter (Signed)
Left message to return a call to discuss sleep study. 

## 2018-03-31 NOTE — Discharge Summary (Signed)
Discharge Summary    Patient ID: [name],  MRN: [id number], DOB/AGE: 10-Jun-1961 57 y.o.  Admit date: 03/30/2018 Discharge date: 03/31/2018  Primary Care Provider: Felix PaciniKuneff, Renee A Primary Cardiologist: Rollene RotundaJames Hochrein, MD  Discharge Diagnoses    Active Problems:   Coronary artery disease involving native coronary artery of native heart with unstable angina pectoris [facility](HCC)   Unstable angina (HCC)   Allergies No Known Allergies  Diagnostic Studies/Procedures    NST 03/23/18 Study Highlights     There was no ST segment deviation noted during stress.  Defect 1: There is a large defect of moderate severity present in the basal inferior, mid inferior and apical inferior location. This defect is slightly worse at stress than at rest and may represent inferior infarct with peri-infarct ischemia.  Non-gated study, no ejection fraction calculated. Echocardiogram however in 2018 demonstrated EF of 75% with severe LVH.  Abnormal study, consider inferior defect.  Donato SchultzMark Skains, MD     Procedures   CORONARY STENT INTERVENTION  LEFT HEART CATH AND CORONARY ANGIOGRAPHY 03/30/18  Conclusion     Prox RCA lesion is 50% stenosed.  Dist RCA lesion is 20% stenosed.  Mid Cx lesion is 90% stenosed.  Ost Cx to Prox Cx lesion is 20% stenosed.  Ost LAD to Prox LAD lesion is 20% stenosed.  A drug-eluting stent was successfully placed using a STENT RESOLUTE ONYX [id number].  Post intervention, there is a 0% residual stenosis.  1. Moderate non-obstructive disease in the proximal RCA 2. Severe stenosis distal Circumflex leading into the obtuse marginal branch.  3. Successful PTCA/DES x 1 distal Circumflex extending into the OM branch  Recommendations: Will continue DAPT with ASA and Plavix for one month along with coumadin. I would stop ASA in one month and continue Plavix along with coumadin for at least one year.     History of Present Illness     57 y.o. male with  h/o atrial fibrillation, on chronic anticoagulation w/ coumadin, HTN, h/o adrenal adenoma s/p resection, tobacco abuse, recent chest pain and fatigue. Admitted for elective LHC after abnormal nuclear stress test.   Hospital Course     Pt was admitted for elective LHC on 03/30/18. Procedure was performed by Dr. Clifton JamesMcAlhany. Access was obtained via the right radial artery. Cath confirmed severe CAD involving the distal Cx (treated w/ PCI +DES) and mild to moderate nonobstructive disease in the proximal LAD and RCA (medical management). He tolerated the procedure well and left the cath lab in stable condition. Dr. Clifton JamesMcAlhany recommended DAPT w/ ASA + Plavix for 1 month, along with coumadin, then stop ASA in 1 month and continue Plavix along with coumadin for at least 1 year. Protonix added for GI protection. He was continued on metoprolol and Cardizem. He remained in atrial fibrillation w/ a controlled ventricular response. His BP was poorly controlled, thus Losartan 25 mg was added to regimen (plan to further tirtate as outpatient). Dr. Tresa EndoKelly recommended clonidine be discontinued. Statin therapy was also added, 80 mg daily. He was monitored overnight and had no post cath complications. His renal function, H/H and right radial cath site remained stable. He ambulated with cardiac rehab w/o difficulty. He was last seen and examined by Dr. Tresa EndoKelly, who determined he was stable for discharge home. He has a f/u INR check on 04/05/18 and post hospital f/u with Dr. Antoine PocheHochrein on 04/12/18. He will also need a f/u BMP at time of office f/u.   Consultants: none  Discharge Vitals Blood pressure (!) 115/115, pulse 80, temperature 98.1 F (36.7 C), temperature source Oral, resp. rate 18, height 6\' 2"  (1.88 m), weight 251 lb 5.2 oz (114 kg), SpO2 100 %.  Filed Weights   03/30/18 1213 03/31/18 0620  Weight: 250 lb (113.4 kg) 251 lb 5.2 oz (114 kg)    Labs & Radiologic Studies    CBC Recent Labs    03/30/18 1242  03/31/18 0301  WBC 8.7 8.6  HGB 14.9 15.0  HCT 44.7 45.5  MCV 92.0 92.3  PLT 158 154   Basic Metabolic Panel Recent Labs    16/10/96 1242 03/31/18 0301  NA 139 137  K 4.3 4.1  CL 107 104  CO2 23 23  GLUCOSE 93 96  BUN 11 15  CREATININE 0.98 1.07  CALCIUM 9.1 9.1   Liver Function Tests Recent Labs    03/31/18 0849  AST 18  ALT 23  ALKPHOS 47  BILITOT 0.6  PROT 6.1*  ALBUMIN 3.6   No results for input(s): LIPASE, AMYLASE in the last 72 hours. Cardiac Enzymes No results for input(s): CKTOTAL, CKMB, CKMBINDEX, TROPONINI in the last 72 hours. BNP Invalid input(s): POCBNP D-Dimer No results for input(s): DDIMER in the last 72 hours. Hemoglobin A1C No results for input(s): HGBA1C in the last 72 hours. Fasting Lipid Panel No results for input(s): CHOL, HDL, LDLCALC, TRIG, CHOLHDL, LDLDIRECT in the last 72 hours. Thyroid Function Tests No results for input(s): TSH, T4TOTAL, T3FREE, THYROIDAB in the last 72 hours.  Invalid input(s): FREET3 _____________  No results found. Disposition   Pt is being discharged home today in good condition.  Follow-up Plans & Appointments    Follow-up Information    [facility] Follow up on 04/05/2018.   Specialty:  Cardiology Why:  for coumadin INR check @1 :30 pm  Contact information: [address] [place] [address] [phone]       Rollene Rotunda, MD Follow up on 04/12/2018.   Specialty:  Cardiology Why:  10:00 am  Contact information: [address] [place] [address] [phone]          Discharge Instructions    Amb Referral to Cardiac Rehabilitation   Complete by:  As directed    Diagnosis:   Coronary Stents PTCA     Diet - low sodium heart healthy   Complete by:  As directed    Discharge instructions   Complete by:  As directed    You will take low dose baby aspirin, 81 mg, for 1 month. You will need to stop taking 81 mg of Aspirin on Apr 30, 2018   Increase activity slowly   Complete by:  As directed       Discharge Medications   Allergies as of 03/31/2018   No Known Allergies     Medication List    STOP taking these medications   cloNIDine 0.1 MG tablet Commonly known as:  CATAPRES     TAKE these medications   acetaminophen 500 MG tablet Commonly known as:  TYLENOL Take 1 tablet (500 mg total) by mouth daily as needed for moderate pain or headache. What changed:  how much to take   albuterol 108 (90 Base) MCG/ACT inhaler Commonly known as:  PROVENTIL HFA;VENTOLIN HFA Inhale 2 puffs into the lungs every 6 (six) hours as needed for wheezing or shortness of breath. What changed:    when to take this  additional instructions   aspirin  EC 81 MG tablet Take 1 tablet (81 mg total) by mouth once for 1 dose.   atorvastatin 80 MG tablet Commonly known as:  LIPITOR Take 1 tablet (80 mg total) by mouth daily at 6 PM.   BRONKAID 25-400 MG Tabs Generic drug:  ePHEDrine-guaiFENesin Take 1 tablet by mouth every 6 (six) hours as needed (for shortness of breath/congestion).   clopidogrel 75 MG tablet Commonly known as:  PLAVIX Take 1 tablet (75 mg total) by mouth daily with breakfast.   diltiazem 240 MG 24 hr capsule Commonly known as:  CARDIZEM CD Take 1 capsule (240 mg total) daily by mouth. What changed:  when to take this   losartan 50 MG tablet Commonly known as:  COZAAR Take 1 tablet (50 mg total) by mouth daily.   metoprolol succinate 100 MG 24 hr tablet Commonly known as:  TOPROL-XL TAKE 1 TABLET BY MOUTH TWICE A DAY   nitroGLYCERIN 0.4 MG SL tablet Commonly known as:  NITROSTAT Place 1 tablet (0.4 mg total) under the tongue every 5 (five) minutes as needed for chest pain.   pantoprazole 40 MG tablet Commonly known as:  PROTONIX Take 1 tablet (40 mg total) by mouth daily.   spironolactone 25 MG tablet Commonly known as:  ALDACTONE TAKE 1-2 TABLETS BY MOUTH DAILY AS DIRECTED What changed:   See the new instructions.   warfarin 5 MG tablet Commonly known as:  COUMADIN Take as directed. If you are unsure how to take this medication, talk to your nurse or doctor. Original instructions:  Take 1 to 1 and 1/2 tablets daily as ordered by coumadin clinic What changed:  additional instructions        Aspirin prescribed at discharge?  Yes High Intensity Statin Prescribed? (Lipitor 40-80mg  or Crestor 20-40mg ): Yes Beta Blocker Prescribed? Yes For EF <40%, was ACEI/ARB Prescribed? Yes ADP Receptor Inhibitor Prescribed? (i.e. Plavix etc.-Includes Medically Managed Patients): Yes For EF <40%, Aldosterone Inhibitor Prescribed? Yes Was EF assessed during THIS hospitalization? No:  Was Cardiac Rehab II ordered? (Included Medically managed Patients): Yes   Outstanding Labs/Studies   BMP at time of hospital F/u and Lipid panel + HFTs in 8 weeks   Duration of Discharge Encounter   Greater than 30 minutes including physician time.  Signed,  Robbie Lis, PA-C 03/31/2018, 2:23 PM

## 2018-03-31 NOTE — Progress Notes (Addendum)
Progress Note  Patient Name: [name] L Fay Date of Encounter: 03/31/2018  Primary Cardiologist: Rollene RotundaJames Hochrein, MD   Subjective   No complaints this morning. He denies CP and dyspnea. No issues with radial cath site. He notes that his BP has "always been high".   Inpatient Medications    Scheduled Meds: . aspirin  81 mg Oral Daily  . cloNIDine  0.1 mg Oral BID  . clopidogrel  75 mg Oral Q breakfast  . diltiazem  240 mg Oral QHS  . metoprolol succinate  100 mg Oral BID  . sodium chloride flush  3 mL Intravenous Q12H  . spironolactone  25 mg Oral BID  . Warfarin - Physician Dosing Inpatient   Does not apply q1800   Continuous Infusions: . sodium chloride     PRN Meds: sodium chloride, acetaminophen, albuterol, ePHEDrine-guaiFENesin, ondansetron (ZOFRAN) IV, sodium chloride flush   Vital Signs    Vitals:   03/30/18 1900 03/30/18 2000 03/31/18 0620 03/31/18 0743  BP: (!) 126/95 (!) 141/101 (!) 141/104 (!) 115/115  Pulse: 88  70 80  Resp: 20 (!) 21 19 18   Temp: 98.2 F (36.8 C)  98.1 F (36.7 C) 98.1 F (36.7 C)  TempSrc: Oral  Oral Oral  SpO2: 99% 99% 96% 100%  Weight:   251 lb 5.2 oz (114 kg)   Height:        Intake/Output Summary (Last 24 hours) at 03/31/2018 0758 Last data filed at 03/31/2018 0746 Gross per 24 hour  Intake 1447.5 ml  Output 400 ml  Net 1047.5 ml   Filed Weights   03/30/18 1213 03/31/18 0620  Weight: 250 lb (113.4 kg) 251 lb 5.2 oz (114 kg)    Telemetry    Atrial fibrillation w/ CVR in the 60s - Personally Reviewed  ECG    Atrial fibrillation - Personally Reviewed  Physical Exam   GEN: No acute distress.  Obese  Neck: No JVD Cardiac: irreguarrly irregullar, no murmurs, rubs, or gallops.  Respiratory: Clear to auscultation bilaterally. GI: Soft, nontender, non-distended  MS: No edema; No deformity. Neuro:  Nonfocal  Psych: Normal affect   Labs    Chemistry Recent Labs  Lab 03/30/18 1242 03/31/18 0301  NA 139 137  K  4.3 4.1  CL 107 104  CO2 23 23  GLUCOSE 93 96  BUN 11 15  CREATININE 0.98 1.07  CALCIUM 9.1 9.1  GFRNONAA >60 >60  GFRAA >60 >60  ANIONGAP 9 10     Hematology Recent Labs  Lab 03/30/18 1242 03/31/18 0301  WBC 8.7 8.6  RBC 4.86 4.93  HGB 14.9 15.0  HCT 44.7 45.5  MCV 92.0 92.3  MCH 30.7 30.4  MCHC 33.3 33.0  RDW 14.4 14.4  PLT 158 154    Cardiac EnzymesNo results for input(s): TROPONINI in the last 168 hours. No results for input(s): TROPIPOC in the last 168 hours.   BNPNo results for input(s): BNP, PROBNP in the last 168 hours.   DDimer No results for input(s): DDIMER in the last 168 hours.   Radiology    No results found.  Cardiac Studies   NST 03/23/18 Study Highlights     There was no ST segment deviation noted during stress.  Defect 1: There is a large defect of moderate severity present in the basal inferior, mid inferior and apical inferior location. This defect is slightly worse at stress than at rest and may represent inferior infarct with peri-infarct ischemia.  Non-gated study, no  ejection fraction calculated. Echocardiogram however in 2018 demonstrated EF of 75% with severe LVH.  Abnormal study, consider inferior defect.   Donato Schultz, MD     Procedures   CORONARY STENT INTERVENTION  LEFT HEART CATH AND CORONARY ANGIOGRAPHY 03/30/18  Conclusion     Prox RCA lesion is 50% stenosed.  Dist RCA lesion is 20% stenosed.  Mid Cx lesion is 90% stenosed.  Ost Cx to Prox Cx lesion is 20% stenosed.  Ost LAD to Prox LAD lesion is 20% stenosed.  A drug-eluting stent was successfully placed using a STENT RESOLUTE ONYX [id number].  Post intervention, there is a 0% residual stenosis.   1. Moderate non-obstructive disease in the proximal RCA 2. Severe stenosis distal Circumflex leading into the obtuse marginal branch.  3. Successful PTCA/DES x 1 distal Circumflex extending into the OM branch  Recommendations: Will continue DAPT with ASA and  Plavix for one month along with coumadin. I would stop ASA in one month and continue Plavix along with coumadin for at least one year.      Patient Profile     57 y.o. male with h/o atrial fibrillation, on chronic anticoagulation w/ coumadin, HTN, h/o adrenal adenoma s/p resection, tobacco abuse, recent chest pain and fatigue. Admitted for elective LHC after abnormal nuclear stress test. Cath 03/30/18 confirmed severe CAD involving the distal Cx (treated w/ PCI) and mild to moderate nonobstructive disease in the proximal LAD and RCA (medical management).   Assessment & Plan    1. CAD: LHC showed moderate non-obstructive disease in the proximal RCA and severe stenosis distal Circumflex leading into the obtuse marginal branch. He underwent successful PTCA/DES x 1 distal Circumflex extending into the OM branch. Recommendations are for DAPT w/ ASA + Plavix for 1 month, along with coumadin, then stop ASA in 1 month and continue Plavix along with coumadin for at least 1 year. Will add Protonix for GI protection. Continue BB. He is not on a statin. Will start Lipitor 80 mg. Will check baseline hepatic function. Goal LDL is < 70 mg/dL. Will follow lipid studies in the office. BP poorly controlled. He would benefit from addition of an ACE/ARB.   2. Post Cath: right radial cath site stable. Renal function and H/H stable.  3. Hypertension: poorly controlled. Pt reports that his BP is "always high", typically in the 140s-160s systolic and low 161W diastolic. He is on metoprolol succinate 100 mg BID, spironolactone 25 mg BID, Cardizem 240 mg and PO clonidine 0.1mg  BID. Renal function is stable. Need to consider addition of an ACE/ARB or thiazide diuretic.   4. Atrial Fibrillation: failed cardioversion in the past. Rate is controlled with cardizem and metoprolol. He is on chronic coumadin for a/c.    For questions or updates, please contact [facility] Please consult [email or web address] for contact info under  Cardiology/STEMI.      Signed, Robbie Lis, PA-C  03/31/2018, 7:58 AM     Patient seen and examined. Agree with assessment and plan. Day 1 s/p PCI to Lcx; no recurrent chest pain. R Radial site stable. Will add losartan 50 mg for more optimal BP control with plans for titration as outpatient to 100 mg if renal and K stable; on spironolactone, cardizem and metoprolol.  Add statin with LDL goal <70.  Warfarin resumed, INR check on Monday. AF rate controlled. Sleep study just completed. DC today.   Lennette Bihari, MD, [facility] 03/31/2018 9:06 AM

## 2018-04-01 ENCOUNTER — Telehealth (HOSPITAL_COMMUNITY): Payer: Self-pay

## 2018-04-01 NOTE — Telephone Encounter (Signed)
Per Phase I - Do not call patient. Closed referral.

## 2018-04-02 ENCOUNTER — Other Ambulatory Visit: Payer: Self-pay | Admitting: Cardiology

## 2018-04-05 ENCOUNTER — Ambulatory Visit (INDEPENDENT_AMBULATORY_CARE_PROVIDER_SITE_OTHER): Payer: BLUE CROSS/BLUE SHIELD | Admitting: Pharmacist

## 2018-04-05 DIAGNOSIS — Z7901 Long term (current) use of anticoagulants: Secondary | ICD-10-CM | POA: Diagnosis not present

## 2018-04-05 DIAGNOSIS — I4819 Other persistent atrial fibrillation: Secondary | ICD-10-CM

## 2018-04-05 LAB — POCT INR: INR: 1.8

## 2018-04-05 NOTE — Progress Notes (Signed)
Patient informed of sleep study results when he came for coumadin check. Copy of the report provided to the patient per his request.

## 2018-04-06 NOTE — Telephone Encounter (Signed)
Discussed patient's sleep study results while in the office having PT/INR.

## 2018-04-11 ENCOUNTER — Encounter: Payer: Self-pay | Admitting: Cardiology

## 2018-04-11 NOTE — Progress Notes (Signed)
Cardiology Office Note   Date:  04/12/2018   ID:  [name], DOB March 13, 1961, MRN [id number]  PCP:  Natalia Leatherwood, DO  Cardiologist:   Rollene Rotunda, MD Referring:    Chief Complaint  Patient presents with  . Atrial Fibrillation      History of Present Illness: [name] is a 57 y.o. male who presents follow up of atrial fib with cardioversion in Sept last year.  However, he only maintained sinus rhythm for about 5 minutes.    He had an abnormal stress test and had cardiac cath.  He had a mid 90% circ stenosis and he had DES placement.  He is trying to change habits and he is reduced his tobacco to 1/2 pack/day.  He is no longer drinking caffeinated sweet tea and is drinking decaf.  He still having shortness of breath with activities.  He is fatigued.  He feels his heart racing but this is occasionally at night.  He is not having presyncope or syncope.  He is not having any new PND or orthopnea.   Past Medical History:  Diagnosis Date  . Anemia    "when I was a little boy" (03/30/2018)  . ARF (acute renal failure) (HCC) 08/13/2017  . Arthritis    "hands" (03/30/2018)  . Chronic bronchitis (HCC)   . Emphysema of lung (HCC)    "early onset" (03/30/2018)  . History of gout   . History of hiatal hernia   . Hypertension   . Persistent atrial fibrillation with rapid ventricular response (HCC) 08/12/2017  . Pneumothorax ~ 2002  . Positive TB test   . Sleep apnea   . Tobacco use 1973    Past Surgical History:  Procedure Laterality Date  . ADRENAL GLAND SURGERY     pt reports adrenal gland removed, he is uncertain which side.   Marland Kitchen CARDIAC CATHETERIZATION     pt describes a cardiac cath procedure as being completed, states it was "clear"  . CARDIOVERSION N/A 09/04/2017   Procedure: CARDIOVERSION;  Surgeon: Wendall Stade, MD;  Location: [facility] ENDOSCOPY;  Service: Cardiovascular;  Laterality: N/A;  . CHEST TUBE INSERTION  ~ 2002   "collapsed lung"  . CORONARY ANGIOPLASTY  WITH STENT PLACEMENT  03/30/2018  . CORONARY STENT INTERVENTION N/A 03/30/2018   Procedure: CORONARY STENT INTERVENTION;  Surgeon: Kathleene Hazel, MD;  Location: [facility] INVASIVE CV LAB;  Service: Cardiovascular;  Laterality: N/A;  . ESOPHAGOGASTRODUODENOSCOPY     pt states it was abnormal, but he does not know why  . FINGER SURGERY Right    "severed all the tendons in my right thumb; did OR to reattach"  . INGUINAL HERNIA REPAIR Left 1996  . LEFT HEART CATH AND CORONARY ANGIOGRAPHY N/A 03/30/2018   Procedure: LEFT HEART CATH AND CORONARY ANGIOGRAPHY;  Surgeon: Kathleene Hazel, MD;  Location: [facility] INVASIVE CV LAB;  Service: Cardiovascular;  Laterality: N/A;  . NASAL HEMORRHAGE CONTROL N/A 02/22/2016   Procedure: EPISTAXIS CONTROL;  Surgeon: Serena Colonel, MD;  Location[facility] OR;  Service: ENT;  Laterality: N/A;     Current Outpatient Medications  Medication Sig Dispense Refill  . acetaminophen (TYLENOL) 500 MG tablet Take 1 tablet (500 mg total) by mouth daily as needed for moderate pain or headache. 30 tablet 0  . albuterol (PROVENTIL HFA;VENTOLIN HFA) 108 (90 Base) MCG/ACT inhaler Inhale 2 puffs into the lungs every 6 (six) hours as needed for wheezing or shortness of breath. (Patient taking differently:  Inhale 2 puffs into the lungs 2 (two) times daily. May inhale 2 additional puffs for a third dose as needed for shortness of breath) 18 g 0  . atorvastatin (LIPITOR) 80 MG tablet Take 1 tablet (80 mg total) by mouth daily at 6 PM. 30 tablet 5  . clopidogrel (PLAVIX) 75 MG tablet Take 1 tablet (75 mg total) by mouth daily with breakfast. 30 tablet 11  . ePHEDrine-GuaiFENesin (BRONKAID) 25-400 MG TABS Take 1 tablet by mouth daily as needed (for shortness of breath/congestion).     Marland Kitchen losartan (COZAAR) 50 MG tablet Take 1 tablet (50 mg total) by mouth daily. 30 tablet 5  . metoprolol succinate (TOPROL-XL) 100 MG 24 hr tablet TAKE 1 TABLET BY MOUTH TWICE A DAY 60 tablet 6  . nitroGLYCERIN  (NITROSTAT) 0.4 MG SL tablet Place 1 tablet (0.4 mg total) under the tongue every 5 (five) minutes as needed for chest pain. 25 tablet 2  . pantoprazole (PROTONIX) 40 MG tablet Take 1 tablet (40 mg total) by mouth daily. 30 tablet 5  . spironolactone (ALDACTONE) 25 MG tablet TAKE 1-2 TABLETS BY MOUTH DAILY AS DIRECTED (Patient taking differently: Take 25 mg by mouth twice daily) 60 tablet 3  . warfarin (COUMADIN) 5 MG tablet Take 1 to 1 and 1/2 tablets daily as ordered by coumadin clinic (Patient taking differently: Take 5 mg nightly on Tue, Wed, Fri, Sat, and Sun.  Take 7.5 mg nightly on Mon and Thu.) 135 tablet 0  . diltiazem (CARDIZEM CD) 240 MG 24 hr capsule Take 1 capsule (240 mg total) daily by mouth. 30 capsule 6   No current facility-administered medications for this visit.     Allergies:   Patient has no known allergies.    ROS:  Please see the history of present illness.   Otherwise, review of systems are positive for none.   All other systems are reviewed and negative.    PHYSICAL EXAM: VS:  BP (!) 157/104   Pulse 68   Ht  (1.88 m)   Wt 262 lb 3.2 oz (118.9 kg)   BMI 33.66 kg/m  , BMI Body mass index is 33.66 kg/m. GENERAL:  Well appearing NECK:  No jugular venous distention, waveform within normal limits, carotid upstroke brisk and symmetric, no bruits, no thyromegaly LUNGS:  Clear to auscultation bilaterally BACK:  No CVA tenderness CHEST:  Unremarkable HEART:  PMI not displaced or sustained,S1 and S2 within normal limits, no S3, no clicks, no rubs, no murmurs ABD:  Flat, positive bowel sounds normal in frequency in pitch, no bruits, no rebound, no guarding, no midline pulsatile mass, no hepatomegaly, no splenomegaly EXT:  2 plus pulses throughout, no edema, no cyanosis no clubbing    EKG:  EKG is not ordered today.    Recent Labs: 11/05/2017: TSH 2.060 03/31/2018: ALT 23; BUN 15; Creatinine, Ser 1.07; Hemoglobin 15.0; Platelets 154; Potassium 4.1; Sodium 137      Lipid Panel No results found for: CHOL, TRIG, HDL, CHOLHDL, VLDL, LDLCALC, LDLDIRECT    Wt Readings from Last 3 Encounters:  04/12/18 262 lb 3.2 oz (118.9 kg)  03/31/18 251 lb 5.2 oz (114 kg)  03/23/18 250 lb (113.4 kg)      Other studies Reviewed: Additional studies/ records that were reviewed today include: None. Review of the above records demonstrates:  Please see elsewhere in the note.     ASSESSMENT AND PLAN:  CAD He continues to have sporadic chest pain is really unchanged  from prior to his stent so it means that the pain may not have been related to his coronary disease.  Is not limiting.  There is no reason to strongly suspect failure of the stent.  He needs continued risk reduction.  Paroxysmal atrial fibrillation (HCC) We had a long discussion about this andd we could attempt cardioversion after Tikosyn but there is no guarantee that this would make him feel better.  He is not at this point inclined to commit to a hospitalization for this and will continue with rate control and anticoagulation.    Hypertension His blood pressure has been under much better control although it is elevated today.  He will continue the meds as listed.  Sleep apnea He did have a sleep study and was told that he did not need CPAP.  Heavy cigarette smoker He is down to 1/2 pack per day and plans to quit.   Dyspnea He has shortness of breath that is probably multifactorial with some suggestion somewhere of previous emphysema.  I am going to send him to see a pulmonologist.   Current medicines are reviewed at length with the patient today.  The patient does not have concerns regarding medicines.  The following changes have been made:  no change  Labs/ tests ordered today include:   Orders Placed This Encounter  Procedures  . Ambulatory referral to Pulmonology     Disposition:   FU with [facility] in 4 months.   Signed, Rollene Rotunda, MD  04/12/2018 10:53 AM    [facility]

## 2018-04-12 ENCOUNTER — Ambulatory Visit (INDEPENDENT_AMBULATORY_CARE_PROVIDER_SITE_OTHER): Payer: BLUE CROSS/BLUE SHIELD | Admitting: Pharmacist Clinician (PhC)/ Clinical Pharmacy Specialist

## 2018-04-12 ENCOUNTER — Ambulatory Visit (INDEPENDENT_AMBULATORY_CARE_PROVIDER_SITE_OTHER): Payer: BLUE CROSS/BLUE SHIELD | Admitting: Cardiology

## 2018-04-12 ENCOUNTER — Encounter: Payer: Self-pay | Admitting: Cardiology

## 2018-04-12 VITALS — BP 157/104 | HR 68 | Ht 74.0 in | Wt 262.2 lb

## 2018-04-12 DIAGNOSIS — Z7901 Long term (current) use of anticoagulants: Secondary | ICD-10-CM | POA: Diagnosis not present

## 2018-04-12 DIAGNOSIS — R0602 Shortness of breath: Secondary | ICD-10-CM | POA: Diagnosis not present

## 2018-04-12 DIAGNOSIS — I10 Essential (primary) hypertension: Secondary | ICD-10-CM

## 2018-04-12 DIAGNOSIS — I481 Persistent atrial fibrillation: Secondary | ICD-10-CM | POA: Diagnosis not present

## 2018-04-12 DIAGNOSIS — F17210 Nicotine dependence, cigarettes, uncomplicated: Secondary | ICD-10-CM

## 2018-04-12 DIAGNOSIS — I4819 Other persistent atrial fibrillation: Secondary | ICD-10-CM

## 2018-04-12 DIAGNOSIS — I25110 Atherosclerotic heart disease of native coronary artery with unstable angina pectoris: Secondary | ICD-10-CM

## 2018-04-12 LAB — POCT INR: INR: 3.5

## 2018-04-12 NOTE — Patient Instructions (Signed)
Description   Take another 1/2 tablet today Monday May 6, then decrease dose to 1 tablet daily except 1.5 tablets each Thursday.  Repeat INR in 1 week.

## 2018-04-12 NOTE — Patient Instructions (Signed)
Medication Instructions:  Your physician recommends that you continue on your current medications as directed. Please refer to the Current Medication list given to you today.   Labwork: None   Testing/Procedures: none  Follow-Up: We request that you follow-up in: 4 months with [facility].    You have been referred to Pulmonology.    Any Other Special Instructions Will Be Listed Below (If Applicable).     If you need a refill on your cardiac medications before your next appointment, please call your pharmacy.

## 2018-04-16 ENCOUNTER — Telehealth: Payer: Self-pay | Admitting: Cardiology

## 2018-04-16 NOTE — Telephone Encounter (Signed)
Next 2 INR appointment changed to 7:30am.   Confirmed with Mrs Radilla today

## 2018-04-16 NOTE — Telephone Encounter (Signed)
New Message:      Pt's wife is wanting to know is there so where outside of our office that her husband can go do coumadin. Pt's wife is unable to bring husband b/w the hours of 8-5

## 2018-04-20 ENCOUNTER — Ambulatory Visit (INDEPENDENT_AMBULATORY_CARE_PROVIDER_SITE_OTHER): Payer: BLUE CROSS/BLUE SHIELD | Admitting: Pharmacist Clinician (PhC)/ Clinical Pharmacy Specialist

## 2018-04-20 DIAGNOSIS — I481 Persistent atrial fibrillation: Secondary | ICD-10-CM

## 2018-04-20 DIAGNOSIS — I4819 Other persistent atrial fibrillation: Secondary | ICD-10-CM

## 2018-04-20 DIAGNOSIS — Z7901 Long term (current) use of anticoagulants: Secondary | ICD-10-CM

## 2018-04-20 LAB — POCT INR: INR: 2.6

## 2018-05-03 ENCOUNTER — Other Ambulatory Visit: Payer: Self-pay | Admitting: Pharmacist Clinician (PhC)/ Clinical Pharmacy Specialist

## 2018-05-04 ENCOUNTER — Other Ambulatory Visit: Payer: Self-pay

## 2018-05-04 MED ORDER — ALBUTEROL SULFATE HFA 108 (90 BASE) MCG/ACT IN AERS: 2.0000 | INHALATION_SPRAY | Freq: Four times a day (QID) | RESPIRATORY_TRACT | 0 refills | Status: DC | PRN

## 2018-05-04 NOTE — Telephone Encounter (Signed)
Rx sent to pharmacy   

## 2018-05-04 NOTE — Telephone Encounter (Signed)
°*  STAT* If patient is at the pharmacy, call can be transferred to refill team.   1. Which medications need to be refilled? (please list name of each medication and dose if known) Albuterol Inhaler  2. Which pharmacy/location (including street and city if local pharmacy) is medication to be sent to?[facility] [facility].[place]  3. Do they need a 30 day or 90 day supply?

## 2018-05-05 ENCOUNTER — Ambulatory Visit (INDEPENDENT_AMBULATORY_CARE_PROVIDER_SITE_OTHER): Payer: BLUE CROSS/BLUE SHIELD | Admitting: Pharmacist

## 2018-05-05 DIAGNOSIS — I4819 Other persistent atrial fibrillation: Secondary | ICD-10-CM

## 2018-05-05 DIAGNOSIS — Z7901 Long term (current) use of anticoagulants: Secondary | ICD-10-CM | POA: Diagnosis not present

## 2018-05-05 LAB — POCT INR: INR: 1.5 — AB (ref 2.0–3.0)

## 2018-05-24 ENCOUNTER — Ambulatory Visit (INDEPENDENT_AMBULATORY_CARE_PROVIDER_SITE_OTHER): Payer: BLUE CROSS/BLUE SHIELD | Admitting: Pharmacist Clinician (PhC)/ Clinical Pharmacy Specialist

## 2018-05-24 DIAGNOSIS — I480 Paroxysmal atrial fibrillation: Secondary | ICD-10-CM

## 2018-05-24 DIAGNOSIS — I4819 Other persistent atrial fibrillation: Secondary | ICD-10-CM

## 2018-05-24 DIAGNOSIS — Z7901 Long term (current) use of anticoagulants: Secondary | ICD-10-CM

## 2018-05-24 LAB — POCT INR: INR: 2.5 (ref 2.0–3.0)

## 2018-05-24 NOTE — Patient Instructions (Signed)
Description   Continue with 1 tablet daily except 1.5 tablets each Wednesday and Friday.  Repeat INR in 4 weeks.

## 2018-05-25 ENCOUNTER — Ambulatory Visit: Payer: Self-pay | Admitting: *Deleted

## 2018-05-25 NOTE — Telephone Encounter (Signed)
Pt calling with concerns of having a stroke due to right arm weakness. Pt states when he woke up on last Wed night he began to experience weakness in the arm. Pt states he is unable to get his fingers to stay stiff, but is able to ball his hand up in a fist. Pt states when he attempts to straighten fingers out his pinky and index finger curl up. Pt states he is unable to straighten fingers while pushing down on knife or fork.Pt states he does experience tingling in his arm at times, but has experienced this before due to sleeping on his arm at times. Pt denies any pain to the arm or hand at this time. Pt denies having any other symptoms at this time. Pt requesting to have appt on Friday 6/21. Pt offered sooner appt time but states that he has to check with his wife because she works Monday-Friday until [employer]5pm. Advised pt to call office back if symptoms become worse before seen for appt. Pt verbalized understanding and states he will call the office back if he needs to change his appt.  Reason for Disposition . [1] Weakness of the face, arm / hand, or leg / foot on one side of the body AND [2] gradual onset (e.g., days to weeks) AND [3] present now  Answer Assessment - Initial Assessment Questions 1. SYMPTOM: "What is the main symptom you are concerned about?" (e.g., weakness, numbness)    Weakness to right arm 2. ONSET: "When did this start?" (minutes, hours, days; while sleeping)     Last Thursday 3. LAST NORMAL: "When was the last time you were normal (no symptoms)?"     Last Thursday 4. PATTERN "Does this come and go, or has it been constant since it started?"  "Is it present now?"     Constant 5. CARDIAC SYMPTOMS: "Have you had any of the following symptoms: chest pain, difficulty breathing, palpitations?"     Hx of A-fib and is currently on Coumadin 6. NEUROLOGIC SYMPTOMS: "Have you had any of the following symptoms: headache, dizziness, vision loss, double vision, changes in speech, unsteady on  your feet?"     No 7. OTHER SYMPTOMS: "Do you have any other symptoms?"     No  Protocols used: NEUROLOGIC DEFICIT-A-AH

## 2018-05-27 ENCOUNTER — Other Ambulatory Visit: Payer: Self-pay | Admitting: Cardiology

## 2018-05-28 ENCOUNTER — Ambulatory Visit: Payer: BLUE CROSS/BLUE SHIELD | Admitting: Family Medicine

## 2018-05-31 ENCOUNTER — Institutional Professional Consult (permissible substitution): Payer: BLUE CROSS/BLUE SHIELD | Admitting: Pulmonary Disease

## 2018-06-14 ENCOUNTER — Other Ambulatory Visit: Payer: Self-pay | Admitting: Cardiology

## 2018-06-15 ENCOUNTER — Ambulatory Visit: Payer: BLUE CROSS/BLUE SHIELD | Admitting: Family Medicine

## 2018-06-21 ENCOUNTER — Ambulatory Visit (INDEPENDENT_AMBULATORY_CARE_PROVIDER_SITE_OTHER): Payer: BLUE CROSS/BLUE SHIELD | Admitting: Pharmacist

## 2018-06-21 DIAGNOSIS — Z7901 Long term (current) use of anticoagulants: Secondary | ICD-10-CM

## 2018-06-21 DIAGNOSIS — I4819 Other persistent atrial fibrillation: Secondary | ICD-10-CM

## 2018-06-21 LAB — POCT INR: INR: 2.2 (ref 2.0–3.0)

## 2018-06-23 ENCOUNTER — Ambulatory Visit (INDEPENDENT_AMBULATORY_CARE_PROVIDER_SITE_OTHER): Payer: BLUE CROSS/BLUE SHIELD | Admitting: Family Medicine

## 2018-06-23 ENCOUNTER — Ambulatory Visit (INDEPENDENT_AMBULATORY_CARE_PROVIDER_SITE_OTHER)
Admission: RE | Admit: 2018-06-23 | Discharge: 2018-06-23 | Disposition: A | Discharge status: Home / Self Care | Payer: BLUE CROSS/BLUE SHIELD | Source: Ambulatory Visit | Attending: Family Medicine | Admitting: Family Medicine

## 2018-06-23 ENCOUNTER — Encounter: Payer: Self-pay | Admitting: Family Medicine

## 2018-06-23 VITALS — BP 127/88 | HR 105 | Temp 98.2°F | Resp 20 | Ht 74.0 in | Wt 258.0 lb

## 2018-06-23 DIAGNOSIS — Z7901 Long term (current) use of anticoagulants: Secondary | ICD-10-CM

## 2018-06-23 DIAGNOSIS — R200 Anesthesia of skin: Secondary | ICD-10-CM | POA: Insufficient documentation

## 2018-06-23 DIAGNOSIS — R0602 Shortness of breath: Secondary | ICD-10-CM

## 2018-06-23 DIAGNOSIS — I10 Essential (primary) hypertension: Secondary | ICD-10-CM

## 2018-06-23 DIAGNOSIS — I481 Persistent atrial fibrillation: Secondary | ICD-10-CM | POA: Diagnosis not present

## 2018-06-23 DIAGNOSIS — J418 Mixed simple and mucopurulent chronic bronchitis: Secondary | ICD-10-CM

## 2018-06-23 DIAGNOSIS — I4819 Other persistent atrial fibrillation: Secondary | ICD-10-CM

## 2018-06-23 DIAGNOSIS — I25110 Atherosclerotic heart disease of native coronary artery with unstable angina pectoris: Secondary | ICD-10-CM | POA: Diagnosis not present

## 2018-06-23 DIAGNOSIS — M47812 Spondylosis without myelopathy or radiculopathy, cervical region: Secondary | ICD-10-CM | POA: Diagnosis not present

## 2018-06-23 DIAGNOSIS — R202 Paresthesia of skin: Secondary | ICD-10-CM | POA: Diagnosis not present

## 2018-06-23 MED ORDER — BUDESONIDE-FORMOTEROL FUMARATE 160-4.5 MCG/ACT IN AERO: 2.0000 | INHALATION_SPRAY | Freq: Two times a day (BID) | RESPIRATORY_TRACT | 11 refills | Status: DC

## 2018-06-23 MED ORDER — ALBUTEROL SULFATE HFA 108 (90 BASE) MCG/ACT IN AERS: 2.0000 | INHALATION_SPRAY | Freq: Four times a day (QID) | RESPIRATORY_TRACT | 11 refills | Status: DC | PRN

## 2018-06-23 MED ORDER — ALBUTEROL SULFATE HFA 108 (90 BASE) MCG/ACT IN AERS: 2.0000 | INHALATION_SPRAY | Freq: Four times a day (QID) | RESPIRATORY_TRACT | 0 refills | Status: DC | PRN

## 2018-06-23 NOTE — Progress Notes (Signed)
Patient ID: [name], male  DOB: 1961/01/06, 57 y.o.   MRN: [id number] Patient Care Team    Relationship Specialty Notifications Start End  Natalia LeatherwoodKuneff, Albaro Deviney A, DO PCP - General Family Medicine  11/24/16   Rollene RotundaHochrein, James, MD PCP - Cardiology Cardiology  03/31/18     Subjective:  [name] is a 57 y.o.  male present for follow up on chronic conditions with new complaint.  Mixed simple and mucopurulent chronic bronchitis (HCC)/SOB Could not afford Symbicort after 1 year offer ended. Only using albuterol (daily). He reports good control on Symbicort. He has another PULM referral placed and he does have an appt. In 2-3 weeks with Dr. Isaiah SergeMannam. He also reports normal sleep study completed through cardiology. He is reporting nighttime symptoms routinely, that he awakens gasping for air and needs to use albuterol. He reports he sleeps on an incline.   Essential hypertension/Morbid obesity (HCC)/Persistent atrial fibrillation with rapid ventricular response (HCC)/Chronic anticoagulation/Coronary artery disease involving native coronary artery of native heart with unstable angina pectoris (HCC)/s/p cardiac stent Viewing of electronic medical records, patient is now established with Dr. Antoine PocheHochrein cardiology.  He has been diagnosed with atrial fib with cardioversion in Sept 2018-had not maintained sinus rhythm for more than a couple minutes.  He had an abnormal stress test and a had cardiac cath.  He had a mid 90% circ stenosis and he had DES placement.  He is currently on Coumadin, following with the Coumadin clinic.  Denies chest pain but admits to intermittent shortness of breath that occurs frequently throughout the night.  Denies any recurrent dizziness or syncope.  Right hand numbness/tingling and weakness: He reports he has had numbness in his right hand for 2 years.  Over the last month is noticed increased weakness in his grip strength.  He has noticed he is dropping objects when picking up.   He endorses numbness and tingling within all fingers.  Endorses a back/shoulder/neck injury that mostly affected his left side in 2001.  There is no immunization history on file for this patient.  Echocardiogram 08/13/2017: Study Conclusions - Left ventricle: The cavity size was normal. Wall thickness was   increased in a pattern of severe LVH. Systolic function was   vigorous. The estimated ejection fraction was in the range of 65%   to 70%. - Left atrium: The atrium was mildly dilated  Past Medical History:  Diagnosis Date  . Anemia    "when I was a little boy" (03/30/2018)  . ARF (acute renal failure) (HCC) 08/13/2017  . Arthritis    "hands" (03/30/2018)  . Chronic bronchitis (HCC)   . Emphysema of lung (HCC)    "early onset" (03/30/2018)  . History of gout   . History of hiatal hernia   . Hypertension   . Persistent atrial fibrillation with rapid ventricular response (HCC) 08/12/2017  . Pneumothorax ~ 2002  . Positive TB test   . Sleep apnea   . Tobacco use 1973   No Known Allergies Past Surgical History:  Procedure Laterality Date  . ADRENAL GLAND SURGERY     pt reports adrenal gland removed, he is uncertain which side.   Marland Kitchen. CARDIAC CATHETERIZATION     pt describes a cardiac cath procedure as being completed, states it was "clear"  . CARDIOVERSION N/A 09/04/2017   Procedure: CARDIOVERSION;  Surgeon: Wendall StadeNishan, Peter C, MD;  [facility];  Service: Cardiovascular;  Laterality: N/A;  . CHEST TUBE INSERTION  ~ 2002   "  collapsed lung"  . CORONARY ANGIOPLASTY WITH STENT PLACEMENT  03/30/2018  . CORONARY STENT INTERVENTION N/A 03/30/2018   Procedure: CORONARY STENT INTERVENTION;  Surgeon: Kathleene Hazel, MD;  Location: [facility] INVASIVE CV LAB;  Service: Cardiovascular;  Laterality: N/A;  . ESOPHAGOGASTRODUODENOSCOPY     pt states it was abnormal, but he does not know why  . FINGER SURGERY Right    "severed all the tendons in my right thumb; did OR to reattach"  .  INGUINAL HERNIA REPAIR Left 1996  . LEFT HEART CATH AND CORONARY ANGIOGRAPHY N/A 03/30/2018   Procedure: LEFT HEART CATH AND CORONARY ANGIOGRAPHY;  Surgeon: Kathleene Hazel, MD;  Location: [facility] INVASIVE CV LAB;  Service: Cardiovascular;  Laterality: N/A;  . NASAL HEMORRHAGE CONTROL N/A 02/22/2016   Procedure: EPISTAXIS CONTROL;  Surgeon: Serena Colonel, MD;  Location: [facility];  Service: ENT;  Laterality: N/A;   Family History  Problem Relation Age of Onset  . Arthritis Mother   . Alcohol abuse Father   . Mental illness Father   . Early death Father   . Arthritis Brother   . Heart disease Brother   . Lymphoma Brother    Social History   Socioeconomic History  . Marital status: Married    Spouse name: Seward Grater  . Number of children: Not on file  . Years of education: 73  . Highest education level: Not on file  Occupational History  . Occupation: Retail banker  Social Needs  . Financial resource strain: Not on file  . Food insecurity:    Worry: Not on file    Inability: Not on file  . Transportation needs:    Medical: Not on file    Non-medical: Not on file  Tobacco Use  . Smoking status: Current Every Day Smoker    Packs/day: 1.00    Years: 45.00    Pack years: 45.00    Types: Cigarettes  . Smokeless tobacco: Never Used  Substance and Sexual Activity  . Alcohol use: Yes    Alcohol/week: 3.6 oz    Types: 6 Cans of beer per week  . Drug use: No  . Sexual activity: Yes    Partners: Female    Birth control/protection: None    Comment: married  Lifestyle  . Physical activity:    Days per week: Not on file    Minutes per session: Not on file  . Stress: Not on file  Relationships  . Social connections:    Talks on phone: Not on file    Gets together: Not on file    Attends religious service: Not on file    Active member of club or organization: Not on file    Attends meetings of clubs or organizations: Not on file    Relationship status: Not on file  . Intimate  partner violence:    Fear of current or ex partner: Not on file    Emotionally abused: Not on file    Physically abused: Not on file    Forced sexual activity: Not on file  Other Topics Concern  . Not on file  Social History Narrative   married to [place].   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    Drinks caffeine, takes a daily vitamin.   Wears a seatbelt. Smoke detector in the home.   Wears dentures.   Feels safe in relationships.   Allergies as of 06/23/2018   No Known Allergies     Medication List  Accurate as of 06/23/18  3:20 PM. Always use your most recent med list.          acetaminophen 500 MG tablet Commonly known as:  TYLENOL Take 1 tablet (500 mg total) by mouth daily as needed for moderate pain or headache.   albuterol 108 (90 Base) MCG/ACT inhaler Commonly known as:  PROVENTIL HFA;VENTOLIN HFA Inhale 2 puffs into the lungs every 6 (six) hours as needed for wheezing or shortness of breath.   atorvastatin 80 MG tablet Commonly known as:  LIPITOR Take 1 tablet (80 mg total) by mouth daily at 6 PM.   BRONKAID 25-400 MG Tabs Generic drug:  ePHEDrine-guaiFENesin Take 1 tablet by mouth daily as needed (for shortness of breath/congestion).   clopidogrel 75 MG tablet Commonly known as:  PLAVIX Take 1 tablet (75 mg total) by mouth daily with breakfast.   diltiazem 240 MG 24 hr capsule Commonly known as:  CARDIZEM CD TAKE 1 CAPSULE (240 MG TOTAL) DAILY BY MOUTH.   losartan 50 MG tablet Commonly known as:  COZAAR Take 1 tablet (50 mg total) by mouth daily.   metoprolol succinate 100 MG 24 hr tablet Commonly known as:  TOPROL-XL TAKE 1 TABLET BY MOUTH TWICE A DAY   nitroGLYCERIN 0.4 MG SL tablet Commonly known as:  NITROSTAT Place 1 tablet (0.4 mg total) under the tongue every 5 (five) minutes as needed for chest pain.   pantoprazole 40 MG tablet Commonly known as:  PROTONIX Take 1 tablet (40 mg total) by mouth daily.   spironolactone 25  MG tablet Commonly known as:  ALDACTONE TAKE 1-2 TABLETS BY MOUTH DAILY AS DIRECTED   warfarin 5 MG tablet Commonly known as:  COUMADIN Take as directed by the anticoagulation clinic. If you are unsure how to take this medication, talk to your nurse or doctor. Original instructions:  TAKE 1 TO 1 AND 1/2 TABLETS DAILY AS ORDERED BY COUMADIN CLINIC       ROS: 14 pt review of systems performed and negative (unless mentioned in an HPI)  Objective: BP 127/88 (BP Location: Right Arm, Patient Position: Sitting, Cuff Size: Large)   Pulse (!) 105   Temp 98.2 F (36.8 C)   Resp 20   Ht 6\' 2"  (1.88 m)   Wt 258 lb (117 kg)   SpO2 96%   BMI 33.13 kg/m   Gen: Afebrile. No acute distress.  Nontoxic and presentation.  Well-developed, well-nourished, morbidly obese occasion male. HENT: AT. [place].  MMM.  Eyes:Pupils Equal Round Reactive to light, Extraocular movements intact,  Conjunctiva without redness, discharge or icterus. Neck/lymp/endocrine: Supple, no lymphadenopath CV: RRR no murmurs, no edema, +2/4 P posterior tibialis pulses Chest: CTAB, no wheeze or crackles Abd: Soft. NTND. BS present. no Masses palpated.  Skin: no rashes, purpura or petechiae.  MSK: Cervical spine bony tenderness, full range of motion without discomfort.  Full range of motion abduction.  Negative empty can test, right.  Negative Tinel's at elbow and wrist, right.  Decreased grip strength right.  Strong pronation and supination against resistance.  Decreased sensation fingers hips.  Vascular intact distally. Neuro:  Normal gait. PERLA. EOMi. Alert. Orientedx3  Psych: Normal affect, dress and demeanor. Normal speech. Normal thought content and judgment.   Assessment/plan: [name] is a 57 y.o. male present for follow-up on hypertension  Mixed simple and mucopurulent chronic bronchitis (HCC)/SOB - could not afford Symbicort after 1 year offer ended. Only using albuterol (daily). He reports good control on Symbicort.  He has another PULM referral placed and he does have an appt. In 2-3 weeks with Dr. Isaiah Serge. - He also reports normal sleep study completed through cardiology - reviewed recent labs 03/2018 - refills on Symbicort (which appears to be the preferred level for his insurance)--> needs to restart. - refills on albuterol, discussed this should be reserved for wheezing or emergency SOB. He is reporting nighttime symptoms routinely. - follow w/ pulm after established.   Essential hypertension/Morbid obesity (HCC)/Persistent atrial fibrillation with rapid ventricular response (HCC)/Chronic anticoagulation/Coronary artery disease involving native coronary artery of native heart with unstable angina pectoris (HCC)/s/p cardiac stent - managed by cardiology s/p stent 03/2018 on coumadin.   Right hand numbness/tingling and weakness: - per EMR he had a right hand injury needing tendon attachment many years ago. He did not mention that during visit. He did mention an upper back/neck injury 18 years ago.  - Exam consistent with hand/finger weakness- grip--> worsening last 1 month, present to some degree for 2 years  - obtain cervical xray - likely referral to neuro fur further eval-nerve conduction studies etc. May benefit from hand surgery.  - DG Cervical Spine Complete; Future - f/u PRN  Encouraged to follow yearly for physicals/wellness exams  Electronically signed by: Felix Pacini, DO [facility] [place]

## 2018-06-23 NOTE — Patient Instructions (Addendum)
1. Called in the albuterol. I also called in the Symbicort.  2. Have xray completed of your cervical spine within next 2 days. Depending on results will get you to neurology for further evaluation and nerve conduction studies if needed.  Followup yearly with physicals.    Peripheral Neuropathy Peripheral neuropathy is a type of nerve damage. It affects nerves that carry signals between the spinal cord and other parts of the body. These are called peripheral nerves. With peripheral neuropathy, one nerve or a group of nerves may be damaged. What are the causes? Many things can damage peripheral nerves. For some people with peripheral neuropathy, the cause is unknown. Some causes include:  Diabetes. This is the most common cause of peripheral neuropathy.  Injury to a nerve.  Pressure or stress on a nerve that lasts a long time.  Too little vitamin B. Alcoholism can lead to this.  Infections.  Autoimmune diseases, such as multiple sclerosis and systemic lupus erythematosus.  Inherited nerve diseases.  Some medicines, such as cancer drugs.  Toxic substances, such as lead and mercury.  Too little blood flowing to the legs.  Kidney disease.  Thyroid disease.  What are the signs or symptoms? Different people have different symptoms. The symptoms you have will depend on which of your nerves is damaged. Common symptoms include:  Loss of feeling (numbness) in the feet and hands.  Tingling in the feet and hands.  Pain that burns.  Very sensitive skin.  Weakness.  Not being able to move a part of the body (paralysis).  Muscle twitching.  Clumsiness or poor coordination.  Loss of balance.  Not being able to control your bladder.  Feeling dizzy.  Sexual problems.  How is this diagnosed? Peripheral neuropathy is a symptom, not a disease. Finding the cause of peripheral neuropathy can be hard. To figure that out, your health care provider will take a medical history and  do a physical exam. A neurological exam will also be done. This involves checking things affected by your brain, spinal cord, and nerves (nervous system). For example, your health care provider will check your reflexes, how you move, and what you can feel. Other types of tests may also be ordered, such as:  Blood tests.  A test of the fluid in your spinal cord.  Imaging tests, such as CT scans or an MRI.  Electromyography (EMG). This test checks the nerves that control muscles.  Nerve conduction velocity tests. These tests check how fast messages pass through your nerves.  Nerve biopsy. A small piece of nerve is removed. It is then checked under a microscope.  How is this treated?  Medicine is often used to treat peripheral neuropathy. Medicines may include: ? Pain-relieving medicines. Prescription or over-the-counter medicine may be suggested. ? Antiseizure medicine. This may be used for pain. ? Antidepressants. These also may help ease pain from neuropathy. ? Lidocaine. This is a numbing medicine. You might wear a patch or be given a shot. ? Mexiletine. This medicine is typically used to help control irregular heart rhythms.  Surgery. Surgery may be needed to relieve pressure on a nerve or to destroy a nerve that is causing pain.  Physical therapy to help movement.  Assistive devices to help movement. Follow these instructions at home:  Only take over-the-counter or prescription medicines as directed by your health care provider. Follow the instructions carefully for any given medicines. Do not take any other medicines without first getting approval from your health care provider.  If you have diabetes, work closely with your health care provider to keep your blood sugar under control.  If you have numbness in your feet: ? Check every day for signs of injury or infection. Watch for redness, warmth, and swelling. ? Wear padded socks and comfortable shoes. These help protect your  feet.  Do not do things that put pressure on your damaged nerve.  Do not smoke. Smoking keeps blood from getting to damaged nerves.  Avoid or limit alcohol. Too much alcohol can cause a lack of B vitamins. These vitamins are needed for healthy nerves.  Develop a good support system. Coping with peripheral neuropathy can be stressful. Talk to a mental health specialist or join a support group if you are struggling.  Follow up with your health care provider as directed. Contact a health care provider if:  You have new signs or symptoms of peripheral neuropathy.  You are struggling emotionally from dealing with peripheral neuropathy.  You have a fever. Get help right away if:  You have an injury or infection that is not healing.  You feel very dizzy or begin vomiting.  You have chest pain.  You have trouble breathing. This information is not intended to replace advice given to you by your health care provider. Make sure you discuss any questions you have with your health care provider. Document Released: 11/14/2002 Document Revised: 05/01/2016 Document Reviewed: 08/01/2013 Elsevier Interactive Patient Education  2017 Elsevier Inc.   Please help [place] help you:  We are honored you have chosen [facility] for your Primary Care home. Below you will find basic instructions that you may need to access in the future. Please help [place] help you by reading the instructions, which cover many of the frequent questions we experience.   Prescription refills and request:  -In order to allow more efficient response time, please call your pharmacy for all refills. They will forward the request electronically to [place]. This allows for the quickest possible response. Request left on a nurse line can take longer to refill, since these are checked as time allows between office patients and other phone calls.  - refill request can take up to 3-5 working days to complete.  - If request is sent  electronically and request is appropiate, it is usually completed in 1-2 business days.  - all patients will need to be seen routinely for all chronic medical conditions requiring prescription medications (see follow-up below). If you are overdue for follow up on your condition, you will be asked to make an appointment and we will call in enough medication to cover you until your appointment (up to 30 days).  - all controlled substances will require a face to face visit to request/refill.  - if you desire your prescriptions to go through a new pharmacy, and have an active script at original pharmacy, you will need to call your pharmacy and have scripts transferred to new pharmacy. This is completed between the pharmacy locations and not by your provider.    Results: If any images or labs were ordered, it can take up to 1 week to get results depending on the test ordered and the lab/facility running and resulting the test. - Normal or stable results, which do not need further discussion, may be released to your mychart immediately with attached note to you. A call may not be generated for normal results. Please make certain to sign up for mychart. If you have questions on how to activate your  mychart you can call the front office.  - If your results need further discussion, our office will attempt to contact you via phone, and if unable to reach you after 2 attempts, we will release your abnormal result to your mychart with instructions.  - All results will be automatically released in mychart after 1 week.  - Your provider will provide you with explanation and instruction on all relevant material in your results. Please keep in mind, results and labs may appear confusing or abnormal to the untrained eye, but it does not mean they are actually abnormal for you personally. If you have any questions about your results that are not covered, or you desire more detailed explanation than what was provided, you  should make an appointment with your provider to do so.   Our office handles many outgoing and incoming calls daily. If we have not contacted you within 1 week about your results, please check your mychart to see if there is a message first and if not, then contact our office.  In helping with this matter, you help decrease call volume, and therefore allow [place] to be able to respond to patients needs more efficiently.   Acute office visits (sick visit):  An acute visit is intended for a new problem and are scheduled in shorter time slots to allow schedule openings for patients with new problems. This is the appropriate visit to discuss a new problem. Problems will not be addressed by phone call or Echart message. Appointment is needed if requesting treatment. In order to provide you with excellent quality medical care with proper time for you to explain your problem, have an exam and receive treatment with instructions, these appointments should be limited to one new problem per visit. If you experience a new problem, in which you desire to be addressed, please make an acute office visit, we save openings on the schedule to accommodate you. Please do not save your new problem for any other type of visit, let us take care of it properly and quickly for you.   Follow up visits:  Depending on your condition(s) your provider will need to see you routinely in order to provide you with quality care and prescribe medication(s). Most chronic conditions (Example: hypertension, Diabetes, depression/anxiety... etc), require visits a couple times a year. Your provider will instruct you on proper follow up for your personal medical conditions and history. Please make certain to make follow up appointments for your condition as instructed. Failing to do so could result in lapse in your medication treatment/refills. If you request a refill, and are overdue to be seen on a condition, we will always provide you with a 30 day  script (once) to allow you time to schedule.    Medicare wellness (well visit): - we have a wonderful Nurse Selena Batten), that will meet with you and provide you will yearly medicare wellness visits. These visits should occur yearly (can not be scheduled less than 1 calendar year apart) and cover preventive health, immunizations, advance directives and screenings you are entitled to yearly through your medicare benefits. Do not miss out on your entitled benefits, this is when medicare will pay for these benefits to be ordered for you.  These are strongly encouraged by your provider and is the appropriate type of visit to make certain you are up to date with all preventive health benefits. If you have not had your medicare wellness exam in the last 12 months, please make certain to schedule  one by calling the office and schedule your medicare wellness with Selena BattenKim as soon as possible.   Yearly physical (well visit):  - Adults are recommended to be seen yearly for physicals. Check with your insurance and date of your last physical, most insurances require one calendar year between physicals. Physicals include all preventive health topics, screenings, medical exam and labs that are appropriate for gender/age and history. You may have fasting labs needed at this visit. This is a well visit (not a sick visit), new problems should not be covered during this visit (see acute visit).  - Pediatric patients are seen more frequently when they are younger. Your provider will advise you on well child visit timing that is appropriate for your their age. - This is not a medicare wellness visit. Medicare wellness exams do not have an exam portion to the visit. Some medicare companies allow for a physical, some do not allow a yearly physical. If your medicare allows a yearly physical you can schedule the medicare wellness with our nurse Selena BattenKim and have your physical with your provider after, on the same day. Please check with insurance  for your full benefits.   Late Policy/No Shows:  - all new patients should arrive 15-30 minutes earlier than appointment to allow us time  to  obtain all personal demographics,  insurance information and for you to complete office paperwork. - All established patients should arrive 10-15 minutes earlier than appointment time to update all information and be checked in .  - In our best efforts to run on time, if you are late for your appointment you will be asked to either reschedule or if able, we will work you back into the schedule. There will be a wait time to work you back in the schedule,  depending on availability.  - If you are unable to make it to your appointment as scheduled, please call 24 hours ahead of time to allow us to fill the time slot with someone else who needs to be seen. If you do not cancel your appointment ahead of time, you may be charged a no show fee.

## 2018-06-24 ENCOUNTER — Telehealth: Payer: Self-pay | Admitting: Family Medicine

## 2018-06-24 DIAGNOSIS — M5030 Other cervical disc degeneration, unspecified cervical region: Secondary | ICD-10-CM | POA: Insufficient documentation

## 2018-06-24 DIAGNOSIS — R200 Anesthesia of skin: Secondary | ICD-10-CM

## 2018-06-24 DIAGNOSIS — M4310 Spondylolisthesis, site unspecified: Secondary | ICD-10-CM

## 2018-06-24 DIAGNOSIS — R202 Paresthesia of skin: Principal | ICD-10-CM

## 2018-06-24 DIAGNOSIS — R29898 Other symptoms and signs involving the musculoskeletal system: Secondary | ICD-10-CM

## 2018-06-24 NOTE — Telephone Encounter (Signed)
Please inform patient the following information: His x-ray of his neck showed multilevel degenerative disc disease with narrowing around the cervical nerve outlets.  I have placed the referral to neurology for further recommendations and likely nerve conduction study.  Should be receiving a call from them to schedule.

## 2018-06-24 NOTE — Telephone Encounter (Signed)
Called patient back reviewed results and information.

## 2018-06-24 NOTE — Telephone Encounter (Signed)
Left detailed message with results and instructions on patient voice mail per DPR 

## 2018-06-24 NOTE — Telephone Encounter (Signed)
Pt is calling because he just missed the call. Please call back

## 2018-06-28 ENCOUNTER — Other Ambulatory Visit: Payer: Self-pay | Admitting: Cardiology

## 2018-07-13 ENCOUNTER — Other Ambulatory Visit (INDEPENDENT_AMBULATORY_CARE_PROVIDER_SITE_OTHER): Payer: BLUE CROSS/BLUE SHIELD

## 2018-07-13 ENCOUNTER — Encounter: Payer: Self-pay | Admitting: Pulmonary Disease

## 2018-07-13 ENCOUNTER — Ambulatory Visit (INDEPENDENT_AMBULATORY_CARE_PROVIDER_SITE_OTHER): Payer: BLUE CROSS/BLUE SHIELD | Admitting: Pulmonary Disease

## 2018-07-13 VITALS — BP 162/110 | HR 90 | Ht 74.0 in | Wt 262.0 lb

## 2018-07-13 DIAGNOSIS — Z122 Encounter for screening for malignant neoplasm of respiratory organs: Secondary | ICD-10-CM | POA: Diagnosis not present

## 2018-07-13 DIAGNOSIS — F17210 Nicotine dependence, cigarettes, uncomplicated: Secondary | ICD-10-CM

## 2018-07-13 DIAGNOSIS — J449 Chronic obstructive pulmonary disease, unspecified: Secondary | ICD-10-CM

## 2018-07-13 DIAGNOSIS — F17200 Nicotine dependence, unspecified, uncomplicated: Secondary | ICD-10-CM

## 2018-07-13 LAB — CBC WITH DIFFERENTIAL/PLATELET
BASOS ABS: 0.1 10*3/uL (ref 0.0–0.1)
Basophils Relative: 1.3 % (ref 0.0–3.0)
EOS ABS: 0.1 10*3/uL (ref 0.0–0.7)
Eosinophils Relative: 1.3 % (ref 0.0–5.0)
HEMATOCRIT: 44.9 % (ref 39.0–52.0)
Hemoglobin: 15.1 g/dL (ref 13.0–17.0)
LYMPHS PCT: 18.6 % (ref 12.0–46.0)
Lymphs Abs: 1.8 10*3/uL (ref 0.7–4.0)
MCHC: 33.6 g/dL (ref 30.0–36.0)
MCV: 91.4 fl (ref 78.0–100.0)
MONO ABS: 1 10*3/uL (ref 0.1–1.0)
Monocytes Relative: 10.8 % (ref 3.0–12.0)
NEUTROS PCT: 68 % (ref 43.0–77.0)
Neutro Abs: 6.5 10*3/uL (ref 1.4–7.7)
PLATELETS: 230 10*3/uL (ref 150.0–400.0)
RBC: 4.91 Mil/uL (ref 4.22–5.81)
RDW: 15.5 % (ref 11.5–15.5)
WBC: 9.5 10*3/uL (ref 4.0–10.5)

## 2018-07-13 MED ORDER — BUPROPION HCL ER (SR) 150 MG PO TB12: ORAL_TABLET | ORAL | 1 refills | Status: DC

## 2018-07-13 NOTE — Patient Instructions (Addendum)
Schedule pulmonary function test for better evaluation of the lung Continue Symbicort Please work on smoking cessation.  Use nicotine gum over-the-counter We will send in a prescription for Wellbutrin Referred for low-dose screening CT of the chest  We will check CBC differential, alpha-1 antitrypsin levels and phenotype.

## 2018-07-13 NOTE — Progress Notes (Signed)
[name]    [id number]    1961/01/20  Primary Care Physician:Kuneff, Ezequiel Essex, DO  Referring Physician: Natalia Leatherwood, DO [address] [place], [place] [address]  Chief complaint:   Evaluation for dyspnea, COPD  HPI: 57 year old active smoker with history of hypertension, atrial fibrillation, coronary artery disease, hyperlipidemia, adrenal adenoma, pneumothorax.  Sent here for evaluation of dyspnea, COPD Complains of chronic dyspnea with activity and at rest associated with cough, nonproductive in nature, wheezing. He had PFTs around 2010 and was told he had COPD at that time and advised to quit smoking.  However he continues to smoke half pack per day. He was on Symbicort in the past and restarted 1 month ago by his primary care physician.  States that this helps with his breathing   History noted for a spontaneous pneumothorax in [place] in 2001 treated with chest tube.  He has not had a recurrence since then. Follows with Dr. Antoine Poche for coronary artery disease, atrial fibrillation.  Pets: Dog, no birds, farm animals Occupation: Used to work as Retail banker.  Out of work since 2018 Exposures: Exposed to aluminum dust and phenol in his line of work Smoking history: 45-pack-year smoker.  Continues to smoke half pack per day Travel history: Lived in [place], [place], [place], [place], [place], [place] state  Outpatient Encounter Medications as of 07/13/2018  Medication Sig  . acetaminophen (TYLENOL) 500 MG tablet Take 1 tablet (500 mg total) by mouth daily as needed for moderate pain or headache.  . albuterol (PROVENTIL HFA;VENTOLIN HFA) 108 (90 Base) MCG/ACT inhaler Inhale 2 puffs into the lungs every 6 (six) hours as needed for wheezing or shortness of breath.  Marland Kitchen aspirin 81 MG chewable tablet Chew by mouth daily.  Marland Kitchen atorvastatin (LIPITOR) 80 MG tablet Take 1 tablet (80 mg total) by mouth daily at 6 PM.  . budesonide-formoterol (SYMBICORT) 160-4.5 MCG/ACT  inhaler Inhale 2 puffs into the lungs 2 (two) times daily.  . clopidogrel (PLAVIX) 75 MG tablet Take 1 tablet (75 mg total) by mouth daily with breakfast.  . diltiazem (CARDIZEM CD) 240 MG 24 hr capsule TAKE 1 CAPSULE (240 MG TOTAL) DAILY BY MOUTH.  Marland Kitchen ePHEDrine-GuaiFENesin (BRONKAID) 25-400 MG TABS Take 1 tablet by mouth daily as needed (for shortness of breath/congestion).   Marland Kitchen losartan (COZAAR) 50 MG tablet Take 1 tablet (50 mg total) by mouth daily.  . metoprolol succinate (TOPROL-XL) 100 MG 24 hr tablet TAKE 1 TABLET BY MOUTH TWICE A DAY  . nitroGLYCERIN (NITROSTAT) 0.4 MG SL tablet Place 1 tablet (0.4 mg total) under the tongue every 5 (five) minutes as needed for chest pain.  . pantoprazole (PROTONIX) 40 MG tablet Take 1 tablet (40 mg total) by mouth daily.  Marland Kitchen spironolactone (ALDACTONE) 25 MG tablet TAKE 1-2 TABLETS BY MOUTH DAILY AS DIRECTED  . warfarin (COUMADIN) 5 MG tablet TAKE 1 TO 1 AND 1/2 TABLETS DAILY AS ORDERED BY COUMADIN CLINIC   No facility-administered encounter medications on file as of 07/13/2018.     Allergies as of 07/13/2018  . (No Known Allergies)    Past Medical History:  Diagnosis Date  . Anemia    "when I was a little boy" (03/30/2018)  . ARF (acute renal failure) (HCC) 08/13/2017  . Arthritis    "hands" (03/30/2018)  . Chronic bronchitis (HCC)   . Emphysema of lung (HCC)    "early onset" (03/30/2018)  . History of gout   .  History of hiatal hernia   . Hypertension   . Persistent atrial fibrillation with rapid ventricular response (HCC) 08/12/2017  . Pneumothorax ~ 2002  . Positive TB test   . Sleep apnea   . Tobacco use 1973    Past Surgical History:  Procedure Laterality Date  . ADRENAL GLAND SURGERY     pt reports adrenal gland removed, he is uncertain which side.   Marland Kitchen. CARDIAC CATHETERIZATION     pt describes a cardiac cath procedure as being completed, states it was "clear"  . CARDIOVERSION N/A 09/04/2017   Procedure: CARDIOVERSION;  Surgeon:  Wendall StadeNishan, Peter C, MD;  [facility];  Service: Cardiovascular;  Laterality: N/A;  . CHEST TUBE INSERTION  ~ 2002   "collapsed lung"  . CORONARY ANGIOPLASTY WITH STENT PLACEMENT  03/30/2018  . CORONARY STENT INTERVENTION N/A 03/30/2018   Procedure: CORONARY STENT INTERVENTION;  Surgeon: Kathleene HazelMcAlhany, Christopher D, MD;  Location: [facility] INVASIVE CV LAB;  Service: Cardiovascular;  Laterality: N/A;  . ESOPHAGOGASTRODUODENOSCOPY     pt states it was abnormal, but he does not know why  . FINGER SURGERY Right    "severed all the tendons in my right thumb; did OR to reattach"  . INGUINAL HERNIA REPAIR Left 1996  . LEFT HEART CATH AND CORONARY ANGIOGRAPHY N/A 03/30/2018   Procedure: LEFT HEART CATH AND CORONARY ANGIOGRAPHY;  Surgeon: Kathleene HazelMcAlhany, Christopher D, MD;  Location: [facility] INVASIVE CV LAB;  Service: Cardiovascular;  Laterality: N/A;  . NASAL HEMORRHAGE CONTROL N/A 02/22/2016   Procedure: EPISTAXIS CONTROL;  Surgeon: Serena ColonelJefry Rosen, MD;  [facility];  Service: ENT;  Laterality: N/A;    Family History  Problem Relation Age of Onset  . Arthritis Mother   . Alcohol abuse Father   . Mental illness Father   . Early death Father   . Arthritis Brother   . Heart disease Brother   . Lymphoma Brother     Social History   Socioeconomic History  . Marital status: Married    Spouse name: [name]  . Number of children: Not on file  . Years of education: 5115  . Highest education level: Not on file  Occupational History  . Occupation: Retail bankerAircraft Mechanic  Social Needs  . Financial resource strain: Not on file  . Food insecurity:    Worry: Not on file    Inability: Not on file  . Transportation needs:    Medical: Not on file    Non-medical: Not on file  Tobacco Use  . Smoking status: Current Every Day Smoker    Packs/day: 1.00    Years: 45.00    Pack years: 45.00    Types: Cigarettes  . Smokeless tobacco: Never Used  Substance and Sexual Activity  . Alcohol use: Yes    Alcohol/week: 3.6 oz     Types: 6 Cans of beer per week  . Drug use: No  . Sexual activity: Yes    Partners: Female    Birth control/protection: None    Comment: married  Lifestyle  . Physical activity:    Days per week: Not on file    Minutes per session: Not on file  . Stress: Not on file  Relationships  . Social connections:    Talks on phone: Not on file    Gets together: Not on file    Attends religious service: Not on file    Active member of club or organization: Not on file    Attends meetings of clubs or organizations:  Not on file    Relationship status: Not on file  . Intimate partner violence:    Fear of current or ex partner: Not on file    Emotionally abused: Not on file    Physically abused: Not on file    Forced sexual activity: Not on file  Other Topics Concern  . Not on file  Social History Narrative   married to [place].   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    Drinks caffeine, takes a daily vitamin.   Wears a seatbelt. Smoke detector in the home.   Wears dentures.   Feels safe in relationships.    Review of systems: Review of Systems  Constitutional: Negative for fever and chills.  HENT: Negative.   Eyes: Negative for blurred vision.  Respiratory: as per HPI  Cardiovascular: Negative for chest pain and palpitations.  Gastrointestinal: Negative for vomiting, diarrhea, blood per rectum. Genitourinary: Negative for dysuria, urgency, frequency and hematuria.  Musculoskeletal: Negative for myalgias, back pain and joint pain.  Skin: Negative for itching and rash.  Neurological: Negative for dizziness, tremors, focal weakness, seizures and loss of consciousness.  Endo/Heme/Allergies: Negative for environmental allergies.  Psychiatric/Behavioral: Negative for depression, suicidal ideas and hallucinations.  All other systems reviewed and are negative.  Physical Exam: Blood pressure 120/80, pulse 90, height 6\' 2"  (1.88 m), weight 262 lb (118.8 kg), SpO2 99 %. Gen:        No acute distress HEENT:  EOMI, sclera anicteric Neck:     No masses; no thyromegaly Lungs:    Clear to auscultation bilaterally; normal respiratory effort CV:         Regular rate and rhythm; no murmurs Abd:      + bowel sounds; soft, non-tender; no palpable masses, no distension Ext:    No edema; adequate peripheral perfusion Skin:      Warm and dry; no rash Neuro: alert and oriented x 3 Psych: normal mood and affect  Data Reviewed: CT chest 01/12/2016-no pulmonary embolism, upper lobe paraseptal emphysema.   Chest x-ray 08/12/2017-no acute lung abnormality I reviewed the images personally.  Assessment:  Evaluation for COPD, chronic bronchitis Schedule pulmonary function test for better evaluation of the lung Continue Symbicort for now Check CBC differential, alpha-1 antitrypsin levels and phenotype.   Active smoker Discussed smoking cessation.  He is interested in quitting but does not like nicotine patches Advised him to use nicotine gum over-the-counter. We will send in a prescription for Wellbutrin.   Reassess at return visit.  Time spent counseling-5 minutes Referr for low-dose screening CT of the chest  Health maintenance Does not want immunizations due to fear of side effects.  Plan/Recommendations: - Continue Symbicort - Schedule pulmonary function test, CBC differential, alpha-1 antitrypsin levels and phenotype - Smoking cessation, Wellbutrin, nicotine gum - Low-dose screening CT of the chest.  Chilton Greathouse MD [facility] 07/13/2018, 9:12 AM  CC: Kuneff, Renee A, DO

## 2018-07-19 ENCOUNTER — Ambulatory Visit (INDEPENDENT_AMBULATORY_CARE_PROVIDER_SITE_OTHER): Payer: BLUE CROSS/BLUE SHIELD | Admitting: Pharmacist

## 2018-07-19 DIAGNOSIS — Z7901 Long term (current) use of anticoagulants: Secondary | ICD-10-CM | POA: Diagnosis not present

## 2018-07-19 DIAGNOSIS — I4819 Other persistent atrial fibrillation: Secondary | ICD-10-CM

## 2018-07-19 LAB — ALPHA-1 ANTITRYPSIN PHENOTYPE: A1 ANTITRYPSIN SER: 145 mg/dL (ref 83–199)

## 2018-07-19 LAB — POCT INR: INR: 2 (ref 2.0–3.0)

## 2018-07-23 ENCOUNTER — Telehealth: Payer: Self-pay | Admitting: Pulmonary Disease

## 2018-07-23 NOTE — Telephone Encounter (Signed)
FYI:  I spoke with pt regarding lung cancer screening.  Pt advised that [employer]BS does not cover this and was given the out of pocket cost options.  Pt states he cannot afford this.  Referral has been cancelled.

## 2018-08-15 ENCOUNTER — Other Ambulatory Visit: Payer: Self-pay | Admitting: Pulmonary Disease

## 2018-08-15 DIAGNOSIS — F17200 Nicotine dependence, unspecified, uncomplicated: Secondary | ICD-10-CM

## 2018-08-16 ENCOUNTER — Ambulatory Visit (INDEPENDENT_AMBULATORY_CARE_PROVIDER_SITE_OTHER): Payer: BLUE CROSS/BLUE SHIELD | Admitting: Pharmacist Clinician (PhC)/ Clinical Pharmacy Specialist

## 2018-08-16 ENCOUNTER — Ambulatory Visit (INDEPENDENT_AMBULATORY_CARE_PROVIDER_SITE_OTHER): Payer: BLUE CROSS/BLUE SHIELD | Admitting: Pulmonary Disease

## 2018-08-16 ENCOUNTER — Other Ambulatory Visit: Payer: Self-pay | Admitting: Pulmonary Disease

## 2018-08-16 DIAGNOSIS — I481 Persistent atrial fibrillation: Secondary | ICD-10-CM

## 2018-08-16 DIAGNOSIS — Z7901 Long term (current) use of anticoagulants: Secondary | ICD-10-CM

## 2018-08-16 DIAGNOSIS — J449 Chronic obstructive pulmonary disease, unspecified: Secondary | ICD-10-CM

## 2018-08-16 DIAGNOSIS — I4819 Other persistent atrial fibrillation: Secondary | ICD-10-CM

## 2018-08-16 LAB — PULMONARY FUNCTION TEST
DL/VA % pred: 88 %
DL/VA: 4.15 ml/min/mmHg/L
DLCO UNC: 27.7 ml/min/mmHg
DLCO cor % pred: 81 %
DLCO cor: 27.33 ml/min/mmHg
DLCO unc % pred: 82 %
FEF 25-75 POST: 1.44 L/s
FEF 25-75 Pre: 1.19 L/sec
FEF2575-%Change-Post: 21 %
FEF2575-%Pred-Post: 46 %
FEF2575-%Pred-Pre: 38 %
FEV1-%CHANGE-POST: 9 %
FEV1-%PRED-POST: 63 %
FEV1-%PRED-PRE: 58 %
FEV1-POST: 2.39 L
FEV1-PRE: 2.19 L
FEV1FVC-%Change-Post: 3 %
FEV1FVC-%PRED-PRE: 80 %
FEV6-%Change-Post: 6 %
FEV6-%PRED-PRE: 74 %
FEV6-%Pred-Post: 78 %
FEV6-POST: 3.74 L
FEV6-Pre: 3.51 L
FEV6FVC-%CHANGE-POST: 0 %
FEV6FVC-%PRED-POST: 102 %
FEV6FVC-%Pred-Pre: 102 %
FVC-%CHANGE-POST: 5 %
FVC-%PRED-POST: 76 %
FVC-%PRED-PRE: 72 %
FVC-POST: 3.79 L
FVC-PRE: 3.58 L
POST FEV6/FVC RATIO: 99 %
PRE FEV6/FVC RATIO: 98 %
Post FEV1/FVC ratio: 63 %
Pre FEV1/FVC ratio: 61 %
RV % PRED: 141 %
RV: 3.23 L
TLC % PRED: 97 %
TLC: 7.03 L

## 2018-08-16 LAB — POCT INR: INR: 3.2 — AB (ref 2.0–3.0)

## 2018-08-16 NOTE — Progress Notes (Signed)
Patient completed full PFT today. 

## 2018-08-25 ENCOUNTER — Ambulatory Visit (INDEPENDENT_AMBULATORY_CARE_PROVIDER_SITE_OTHER): Payer: BLUE CROSS/BLUE SHIELD | Admitting: Neurology

## 2018-08-25 ENCOUNTER — Encounter

## 2018-08-25 ENCOUNTER — Encounter: Payer: Self-pay | Admitting: Neurology

## 2018-08-25 VITALS — BP 144/101 | HR 99 | Ht 72.0 in | Wt 266.0 lb

## 2018-08-25 DIAGNOSIS — G5603 Carpal tunnel syndrome, bilateral upper limbs: Secondary | ICD-10-CM

## 2018-08-25 DIAGNOSIS — G5600 Carpal tunnel syndrome, unspecified upper limb: Secondary | ICD-10-CM | POA: Insufficient documentation

## 2018-08-25 NOTE — Patient Instructions (Signed)
EMG/NCS:  Electromyoneurogram Electromyoneurogram is a test to check how well your muscles and nerves are working. This procedure includes the combined use of electromyogram (EMG) and nerve conduction study (NCS). EMG is used to look for muscular disorders. NCS, which is also called electroneurogram, measures how well your nerves are controlling your muscles. The procedures are usually performed together to check if your muscles and nerves are healthy. If the reaction to testing is abnormal, this can indicate disease or injury, such as peripheral nerve damage. Tell a health care provider about:  Any allergies you have.  All medicines you are taking, including vitamins, herbs, eye drops, creams, and over-the-counter medicines.  Any problems you or family members have had with anesthetic medicines.  Any blood disorders you have.  Any surgeries you have had.  Any medical conditions you have.  Any pacemaker you have. What are the risks? Generally, this is a safe procedure. However, problems may occur, including:  Infection where the electrodes were inserted.  Bleeding.  What happens before the procedure?  Ask your health care provider about: ? Changing or stopping your regular medicines. This is especially important if you are taking diabetes medicines or blood thinners. ? Taking medicines such as aspirin and ibuprofen. These medicines can thin your blood. Do not take these medicines before your procedure if your health care provider instructs you not to.  Your health care provider may ask you to avoid: ? Caffeine, such as coffee and tea. ? Nicotine. This includes cigarettes and anything with tobacco.  Do not use lotions or creams on the same day that you will be having the procedure. What happens during the procedure? For EMG:  Your health care provider will ask you to stay in a position so that he or she can access the muscle that will be studied. You may be standing, sitting  down, or lying down.  You may be given a medicine that numbs the area (local anesthetic).  A very thin needle that has an electrode on it will be inserted into your muscle.  Another small electrode will be placed on your skin near the muscle.  Your health care provider will ask you to continue to remain still.  The electrodes will send a signal that tells about the electrical activity of your muscles. You may see this on a monitor or hear it in the room.  After your muscles have been studied at rest, your health care provider will ask you to contract or flex your muscles. The electrodes will send a signal that tells about the electrical activity of your muscles.  Your health care provider will remove the electrodes and the electrode needles when the procedure is finished. The procedure may vary among health care providers and hospitals. For NCS:  An electrode that records your nerve activity (recording electrode) will be placed on your skin by the muscle that is being studied.  An electrode that is used as a reference (reference electrode) will be placed near the recording electrode.  A paste or gel will be applied to your skin between the recording electrode and the reference electrode.  Your nerve will be stimulated with a mild shock. Your health care provider will measure how much time it takes for your muscle to react.  Your health care provider will remove the electrodes and the gel when the procedure is finished. The procedure may vary among health care providers and hospitals. What happens after the procedure?  It is your responsibility to obtain  your test results. Ask your health care provider or the department performing the test when and how you will get your results.  Your health care provider may: ? Give you medicines for any pain. ? Monitor the insertion sites to make sure that they stop bleeding. This information is not intended to replace advice given to you by your  health care provider. Make sure you discuss any questions you have with your health care provider. Document Released: 03/27/2005 Document Revised: 05/01/2016 Document Reviewed: 01/15/2015 Elsevier Interactive Patient Education  2018 [employer]   Carpal Tunnel Syndrome Carpal tunnel syndrome is a condition that causes pain in your hand and arm. The carpal tunnel is a narrow area that is on the palm side of your wrist. Repeated wrist motion or certain diseases may cause swelling in the tunnel. This swelling can pinch the main nerve in the wrist (median nerve). Follow these instructions at home: If you have a splint:  Wear it as told by your doctor. Remove it only as told by your doctor.  Loosen the splint if your fingers: ? Become numb and tingle. ? Turn blue and cold.  Keep the splint clean and dry. General instructions  Take over-the-counter and prescription medicines only as told by your doctor.  Rest your wrist from any activity that may be causing your pain. If needed, talk to your employer about changes that can be made in your work, such as getting a wrist pad to use while typing.  If directed, apply ice to the painful area: ? Put ice in a plastic bag. ? Place a towel between your skin and the bag. ? Leave the ice on for 20 minutes, 2-3 times per day.  Keep all follow-up visits as told by your doctor. This is important.  Do any exercises as told by your doctor, physical therapist, or occupational therapist. Contact a doctor if:  You have new symptoms.  Medicine does not help your pain.  Your symptoms get worse. This information is not intended to replace advice given to you by your health care provider. Make sure you discuss any questions you have with your health care provider. Document Released: 11/13/2011 Document Revised: 05/01/2016 Document Reviewed: 04/11/2015 Elsevier Interactive Patient Education  [employer].

## 2018-08-25 NOTE — Progress Notes (Signed)
[facility]    Provider:  Dr Lucia Gaskins Referring Provider: Natalia Leatherwood, DO Primary Care Physician:  Natalia Leatherwood, DO  CC:  Hand numbness and tingling  HPI:  [name] is a 57 y.o. male here as requested by Dr. Claiborne Billings for left hand numbness. PMHx afib, HTN, morbid obesity, CAD, COPD, Cervical DDD, etoh abuse, tobacco abuse, long-term anti-coagulants. 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 In his right hand he can't even hold toilet paper. Dropping things. Worse at night, extreme numbness, feels like he slept on them and turning blue. Progressively worsening. No neck pain. Can hurt. Working makes it worse. Ongoing for years, progressive.  Reviewed notes, labs and imaging from outside physicians, which showed:  Reviewed referring physician notes Dr. Claiborne Billings.  Patient has right hand numbness tingling and weakness.  He reports that he has had numbness in his right hand for 2 years.  Over the last month noticed increased weakness in his grip strength.  He is dropping objects and picking up.  He endorses numbness and tingling within all the fingers.  He also has a back shoulder neck injury the mostly affected his left side in 2001.  Personally reviewed x-ray images of the cervical spine which showed diffuse multilevel degenerative change, 4 mm anterolisthesis C4 and C5.  Review of Systems: Patient complains of symptoms per HPI as well as the following symptoms: Insomnia, snoring, restless legs, numbness, weakness, dizziness, not enough sleep, decreased energy, easy bruising, easy bleeding, feeling hot, joint pain, joint swelling, cramps, blurred vision, weight gain, fatigue. Pertinent negatives and positives per HPI. All others negative.   Social History   Socioeconomic History  . Marital status: Married    Spouse name: Seward Grater  . Number of children: Not on file  . Years of  education: 50  . Highest education level: Not on file  Occupational History  . Occupation: Retail banker  Social Needs  . Financial resource strain: Not on file  . Food insecurity:    Worry: Not on file    Inability: Not on file  . Transportation needs:    Medical: Not on file    Non-medical: Not on file  Tobacco Use  . Smoking status: Current Every Day Smoker    Packs/day: 1.00    Years: 45.00    Pack years: 45.00    Types: Cigarettes  . Smokeless tobacco: Never Used  Substance and Sexual Activity  . Alcohol use: Yes    Alcohol/week: 6.0 standard drinks    Types: 6 Cans of beer per week  . Drug use: No  . Sexual activity: Yes    Partners: Female    Birth control/protection: None    Comment: married  Lifestyle  . Physical activity:    Days per week: Not on file    Minutes per session: Not on file  . Stress: Not on file  Relationships  . Social connections:    Talks on phone: Not on file    Gets together: Not on file    Attends religious service: Not on file    Active member of club or organization: Not on file    Attends meetings of clubs or organizations: Not on file    Relationship status: Not on file  . Intimate partner violence:    Fear of current or ex partner: Not on file    Emotionally abused: Not  on file    Physically abused: Not on file    Forced sexual activity: Not on file  Other Topics Concern  . Not on file  Social History Narrative   married to [place]Maggie.   15 years education. Aircraft heavy Insurance account managerstructures mechanic.    Drinks caffeine   Wears a seatbelt. Smoke detector in the home.   Wears dentures.   Feels safe in relationships.    Family History  Problem Relation Age of Onset  . Arthritis Mother   . Alcohol abuse Father   . Mental illness Father   . Early death Father   . Arthritis Brother   . Heart disease Brother        open heart surgery  . Lymphoma Brother     Past Medical History:  Diagnosis Date  . Adrenal cyst (HCC)   .  Anemia    "when I was a little boy" (03/30/2018)  . ARF (acute renal failure) (HCC) 08/13/2017  . Arthritis    "hands" (03/30/2018)  . Atrial fibrillation (HCC)   . Chronic bronchitis (HCC)   . Emphysema of lung (HCC)    "early onset" (03/30/2018)  . High cholesterol   . History of gout   . History of hiatal hernia   . Hypertension   . Persistent atrial fibrillation with rapid ventricular response (HCC) 08/12/2017  . Pneumothorax ~ 2002  . Positive TB test   . Sleep apnea    pt states CPAP not needed based on results of sleep study  . Tobacco use 1973    Past Surgical History:  Procedure Laterality Date  . ADRENAL GLAND SURGERY     pt reports adrenal gland removed, he is uncertain which side.   Marland Kitchen. CARDIAC CATHETERIZATION     pt describes a cardiac cath procedure as being completed, states it was "clear"  . CARDIOVERSION N/A 09/04/2017   Procedure: CARDIOVERSION;  Surgeon: Wendall StadeNishan, Peter C, MD;  [facility];  Service: Cardiovascular;  Laterality: N/A;  . CHEST TUBE INSERTION  ~ 2002   "collapsed lung"  . CORONARY ANGIOPLASTY WITH STENT PLACEMENT  03/30/2018  . CORONARY STENT INTERVENTION N/A 03/30/2018   Procedure: CORONARY STENT INTERVENTION;  Surgeon: Kathleene HazelMcAlhany, Christopher D, MD;  Location: [facility] INVASIVE CV LAB;  Service: Cardiovascular;  Laterality: N/A;  . ESOPHAGOGASTRODUODENOSCOPY     pt states it was abnormal, but he does not know why  . FINGER SURGERY Right    "severed all the tendons in my right thumb; did OR to reattach"  . INGUINAL HERNIA REPAIR Left 1996  . LEFT HEART CATH AND CORONARY ANGIOGRAPHY N/A 03/30/2018   Procedure: LEFT HEART CATH AND CORONARY ANGIOGRAPHY;  Surgeon: Kathleene HazelMcAlhany, Christopher D, MD;  Location: [facility] INVASIVE CV LAB;  Service: Cardiovascular;  Laterality: N/A;  . NASAL HEMORRHAGE CONTROL N/A 02/22/2016   Procedure: EPISTAXIS CONTROL;  Surgeon: Serena ColonelJefry Rosen, MD;  [facility];  Service: ENT;  Laterality: N/A;    Current Outpatient Medications    Medication Sig Dispense Refill  . acetaminophen (TYLENOL) 500 MG tablet Take 1 tablet (500 mg total) by mouth daily as needed for moderate pain or headache. 30 tablet 0  . albuterol (PROVENTIL HFA;VENTOLIN HFA) 108 (90 Base) MCG/ACT inhaler Inhale 2 puffs into the lungs every 6 (six) hours as needed for wheezing or shortness of breath. 18 g 11  . aspirin 81 MG chewable tablet Chew by mouth daily.    Marland Kitchen. atorvastatin (LIPITOR) 80 MG tablet Take 1 tablet (80 mg total)  by mouth daily at 6 PM. 30 tablet 5  . budesonide-formoterol (SYMBICORT) 160-4.5 MCG/ACT inhaler Inhale 2 puffs into the lungs 2 (two) times daily. 1 Inhaler 11  . clopidogrel (PLAVIX) 75 MG tablet Take 1 tablet (75 mg total) by mouth daily with breakfast. 30 tablet 11  . diltiazem (CARDIZEM CD) 240 MG 24 hr capsule TAKE 1 CAPSULE (240 MG TOTAL) DAILY BY MOUTH. 30 capsule 4  . ePHEDrine-GuaiFENesin (BRONKAID) 25-400 MG TABS Take 1 tablet by mouth daily as needed (for shortness of breath/congestion).     Marland Kitchen losartan (COZAAR) 50 MG tablet Take 1 tablet (50 mg total) by mouth daily. 30 tablet 5  . metoprolol succinate (TOPROL-XL) 100 MG 24 hr tablet TAKE 1 TABLET BY MOUTH TWICE A DAY 180 tablet 2  . pantoprazole (PROTONIX) 40 MG tablet Take 1 tablet (40 mg total) by mouth daily. 30 tablet 5  . spironolactone (ALDACTONE) 25 MG tablet TAKE 1-2 TABLETS BY MOUTH DAILY AS DIRECTED 90 tablet 2  . warfarin (COUMADIN) 5 MG tablet TAKE 1 TO 1 AND 1/2 TABLETS DAILY AS ORDERED BY COUMADIN CLINIC 135 tablet 0  . nitroGLYCERIN (NITROSTAT) 0.4 MG SL tablet Place 1 tablet (0.4 mg total) under the tongue every 5 (five) minutes as needed for chest pain. 25 tablet 2   No current facility-administered medications for this visit.     Allergies as of 08/25/2018  . (No Known Allergies)    Vitals: BP (!) 144/101 (BP Location: Right Arm, Patient Position: Sitting)   Pulse 99   Ht 6' (1.829 m)   Wt 266 lb (120.7 kg)   BMI 36.08 kg/m  Last Weight:  Wt  Readings from Last 1 Encounters:  08/25/18 266 lb (120.7 kg)   Last Height:   Ht Readings from Last 1 Encounters:  08/25/18 6' (1.829 m)   Physical exam: Exam: Gen: NAD, morbidly obese, tangential                     CV: RRR, no MRG. No Carotid Bruits. No peripheral edema, warm, nontender Eyes: Conjunctivae clear without exudates or hemorrhage  Neuro: Detailed Neurologic Exam  Speech:    Speech is normal; fluent and spontaneous with normal comprehension.  Cognition:    The patient is oriented to person, place, and time;     recent and remote memory intact;     language fluent;     normal attention, concentration,     fund of knowledge Cranial Nerves:    The pupils are equal, round, and reactive to light. Attempted fundoscopic exam could not visualizeVisual fields are full to finger confrontation. Extraocular movements are intact. Trigeminal sensation is intact and the muscles of mastication are normal. The face is symmetric. The palate elevates in the midline. Hearing intact. Voice is normal. Shoulder shrug is normal. The tongue has normal motion without fasciculations.   Coordination:    No dysmetria.   Gait:    Wide based, large body habitus  Motor Observation:    No asymmetry, no atrophy, and no involuntary movements noted. Tone:    Normal muscle tone.    Posture:    Posture is normal. normal erect    Strength:    Strength is V/V in the upper and lower limbs.      Sensation: intact to LT     Reflex Exam:  DTR's:    Absent AJs. Deep tendon reflexes in the upper and lower extremities are symmetrical bilaterally.   Toes:  The toes are equiv bilaterally.   Clonus:    Clonus is absent.  + Phalen's maneuver     Assessment/Plan:  57 year old with likely bilateral CTS  -emg/ncs -he denies neck pain or radicular symptoms will not order MRI cervical spine unless indicated by emg/ncs   Orders Placed This Encounter  Procedures  . NCV with  EMG(electromyography)   Cc: Dr. Brendia Sacks, MD  [facility] [address] [place], [place] [address]  Phone [phone] Fax [phone]

## 2018-08-31 ENCOUNTER — Other Ambulatory Visit: Payer: Self-pay | Admitting: Cardiology

## 2018-09-10 ENCOUNTER — Ambulatory Visit (INDEPENDENT_AMBULATORY_CARE_PROVIDER_SITE_OTHER): Payer: BLUE CROSS/BLUE SHIELD | Admitting: Pharmacist

## 2018-09-10 DIAGNOSIS — Z7901 Long term (current) use of anticoagulants: Secondary | ICD-10-CM

## 2018-09-10 DIAGNOSIS — I4819 Other persistent atrial fibrillation: Secondary | ICD-10-CM

## 2018-09-10 LAB — POCT INR: INR: 1.3 — AB (ref 2.0–3.0)

## 2018-09-24 ENCOUNTER — Other Ambulatory Visit: Payer: Self-pay | Admitting: Cardiology

## 2018-10-07 ENCOUNTER — Ambulatory Visit (INDEPENDENT_AMBULATORY_CARE_PROVIDER_SITE_OTHER): Payer: BLUE CROSS/BLUE SHIELD | Admitting: Neurology

## 2018-10-07 DIAGNOSIS — G5623 Lesion of ulnar nerve, bilateral upper limbs: Secondary | ICD-10-CM

## 2018-10-07 DIAGNOSIS — G5620 Lesion of ulnar nerve, unspecified upper limb: Secondary | ICD-10-CM

## 2018-10-07 DIAGNOSIS — Z0289 Encounter for other administrative examinations: Secondary | ICD-10-CM

## 2018-10-07 DIAGNOSIS — G5603 Carpal tunnel syndrome, bilateral upper limbs: Secondary | ICD-10-CM

## 2018-10-07 NOTE — Patient Instructions (Signed)
Cubital Tunnel Syndrome Cubital tunnel syndrome is a condition that causes pain and weakness of the forearm and hand. This condition happens when one of the nerves (ulnar nerve) that runs alongside the elbow joint becomes irritated. What are the causes? Causes of this condition include:  Increased pressure on the ulnar nerve at the elbow, arm, or forearm. This can be caused by: ? Swollen tissues. ? Ligaments. ? Muscles. ? Poorly healed elbow fractures. ? Tumors in the elbow. These are usually noncancerous (benign). ? Scar tissue that develops in the elbow after an injury. ? Bony growths (spurs) near the ulnar nerve.  Stretching of the nerve due to loose elbow ligaments.  Trauma to the nerve at the elbow.  Repetitive elbow bending.  Certain medical conditions.  What increases the risk? This condition is more likely to develop in:  People who do manual labor that requires frequently bending the elbow.  People who play sports that include repeated or strenuous throwing motions, such as baseball.  People who play contact sports, such as football or lacrosse.  People who do not warm up properly before activities.  People who have diabetes.  People who have an underactive thyroid (hypothyroidism).  What are the signs or symptoms? Symptoms of this condition include:  Clumsiness or weakness of the hand.  Tenderness of the inner elbow.  Aching or soreness of the inner elbow, forearm, or fingers, especially the little finger or the ring finger.  Increased pain with forced elbow bending.  Reduced control when throwing.  Tingling, numbness, or burning inside the forearm, or in part of the hand or fingers, especially the little finger or the ring finger.  Sharp pains that shoot from the elbow down to the wrist and hand.  The inability to grip or pinch hard.  How is this diagnosed? This condition is diagnosed with a medical history and physical exam. Your health care  provider will ask about your symptoms and ask for details about any injury. You may also have other tests, including:  Electromyogram (EMG). This test checks how well the nerve is working.  X-ray.  How is this treated? Treatment starts by stopping the activities that are causing your symptoms to get worse. Treatment may include the use of icing and medicines to reduce pain and swelling. You may also be advised to wear a splint to prevent your elbow from bending or wear an elbow pad where the ulnar nerve is closest to the skin. In less severe cases, treatment may also include working with a physical therapist:  To help decrease your symptoms.  To improve the strength and range of motion of your elbow, forearm, and hand.  If the treatments described above do not help, surgery may be needed. Follow these instructions at home: If you have a splint:  Wear it as told by your health care provider. Remove it only as told by your health care provider.  Loosen the splint if your fingers become numb and tingle, or if they turn cold and blue.  Keep the splint clean and dry. Managing pain, stiffness, and swelling  If directed, apply ice to the injured area: ? Put ice in a plastic bag. ? Place a towel between your skin and the bag. ? Leave the ice on for 20 minutes, 2-3 times per day.  Move your fingers often to avoid stiffness and to lessen swelling.  Raise (elevate) the injured area above the level of your heart while you are sitting or lying down. General   instructions  Take over-the-counter and prescription medicines only as told by your health care provider.  Keep all follow-up visits as told by your health care provider. This is important.  Do any exercise or physical therapy as told by your health care provider.  Do not drive or operate heavy machinery while taking prescription pain medicine.  If you were given an elbow pad, wear it as told by your health care provider. Contact a  health care provider if:  Your symptoms get worse.  Your symptoms do not get better with treatment.  Your have new pain.  Your hand on the injured side feels numb or cold. This information is not intended to replace advice given to you by your health care provider. Make sure you discuss any questions you have with your health care provider. Document Released: 11/24/2005 Document Revised: 05/01/2016 Document Reviewed: 01/31/2015 Elsevier Interactive Patient Education  2018 [employer].   Carpal Tunnel Syndrome Carpal tunnel syndrome is a condition that causes pain in your hand and arm. The carpal tunnel is a narrow area that is on the palm side of your wrist. Repeated wrist motion or certain diseases may cause swelling in the tunnel. This swelling can pinch the main nerve in the wrist (median nerve). Follow these instructions at home: If you have a splint:  Wear it as told by your doctor. Remove it only as told by your doctor.  Loosen the splint if your fingers: ? Become numb and tingle. ? Turn blue and cold.  Keep the splint clean and dry. General instructions  Take over-the-counter and prescription medicines only as told by your doctor.  Rest your wrist from any activity that may be causing your pain. If needed, talk to your employer about changes that can be made in your work, such as getting a wrist pad to use while typing.  If directed, apply ice to the painful area: ? Put ice in a plastic bag. ? Place a towel between your skin and the bag. ? Leave the ice on for 20 minutes, 2-3 times per day.  Keep all follow-up visits as told by your doctor. This is important.  Do any exercises as told by your doctor, physical therapist, or occupational therapist. Contact a doctor if:  You have new symptoms.  Medicine does not help your pain.  Your symptoms get worse. This information is not intended to replace advice given to you by your health care provider. Make sure you  discuss any questions you have with your health care provider. Document Released: 11/13/2011 Document Revised: 05/01/2016 Document Reviewed: 04/11/2015 Elsevier Interactive Patient Education  [employer].

## 2018-10-12 NOTE — Progress Notes (Signed)
See procedure note.

## 2018-10-12 NOTE — Progress Notes (Signed)
Full Name: [name] Gender: Male MRN #: [id number] Date of Birth: 04/17/2061    Visit Date: 10/07/18 10:08 Age: 57 Years 6 Months Old Examining Physician: Naomie Dean, MD   History: Hand numbness and tingling  Summary: EMG/NCS performed on the bilateral upper extremities  The right median APB motor nerve showed prolonged distal onset latency (4.6 ms, N<4.4). The right Median 2nd Digit orthodromic sensory nerve showed prolonged distal peak latency (4.8 ms, N<3.4) and reduced amplitude(5 uV, N>10)  The left  median APB motor nerve showed prolonged distal onset latency (5.1 ms, N<4.4). The left  Median 2nd Digit orthodromic sensory nerve showed prolonged distal peak latency (5.3 ms, N<3.4) and reduced amplitude(2 uV, N>10)  The right Ulnar ADM motor nerve showed decreased conduction velocity (A Elbow-B Elbow, 29 m/s, N>49) with a 28m/s drop across the elbow section (N<63m/s).  The right Ulnar 5th digit sensory nerve showed NR.  The left Ulnar ADM motor nerve showed decreased conduction velocity (A Elbow-B Elbow, 34 m/s, N>49) with a 89m/s drop across the elbow section (N<46m/s).  The left  Ulnar 5th digit sensory nerve showed NR  F Wave studies indicate that the right Ulnar F wave has delayed latency(35.5, N<72ms) and the left Ulnar F wave has delayed latency(36.4, N<34ms)  The right median/radial comparison nerve showed prolonged distal peak latency (Median Palm, 4.5 ms, N<2.2) and abnormal peak latency difference (Median Palm-Ulnar Palm, 1.9 ms, N<0.5) with a relative median delay.    The left median/radial comparison nerve showed prolonged distal peak latency (Median Palm, 5.7 ms, N<2.2) and abnormal peak latency difference (Median Palm-Ulnar Palm, 2.9 ms, N<0.5) with a relative median delay.  The left flexor digitorum profundus and left first dorsal interosseous muscles showed increased spontaneous activity (3+ positive sharp waves) and reduced motor unit recruitment.       Conclusion: 1. There is electrophysiologic evidence for severe left ulnar neuropathy at the elbow and moderately-severe right ulnar neuropathy at the elbow. 2. There is also concomitant bilateral moderately-severe Carpal Tunnel Syndrome. 3. No indication of cervical radiculopathy.   Naomie Dean M.D.  [facility] [address] [place], [place] [address] Tel: [phone] Fax: [phone]        [facility]    Nerve / Sites Muscle Latency Ref. Amplitude Ref. Rel Amp Segments Distance Velocity Ref. Area    ms ms mV mV %  cm m/s m/s mVms  R Median - APB     Wrist APB 4.6 ?4.4 5.9 ?4.0 100 Wrist - APB 7   16.8     Upper arm APB 9.1  5.7  97.3 Upper arm - Wrist 23 51 ?49 18.6  L Median - APB     Wrist APB 5.1 ?4.4 7.3 ?4.0 100 Wrist - APB 7   29.3     Upper arm APB 9.8  7.0  96.7 Upper arm - Wrist 23 49 ?49 29.0  R Ulnar - ADM     Wrist ADM 3.1 ?3.3 7.9 ?6.0 100 Wrist - ADM 7   26.5     B.Elbow ADM 8.2  6.2  78 B.Elbow - Wrist 25 49 ?49 21.0     A.Elbow ADM 11.7  3.6  58 A.Elbow - B.Elbow 10 29 ?49 12.1         A.Elbow - Wrist      L Ulnar - ADM     Wrist ADM 3.1 ?3.3 11.1 ?6.0 100 Wrist - ADM 7  40.8     B.Elbow ADM 8.1  10.7  96.4 B.Elbow - Wrist 25 50 ?49 39.5     A.Elbow ADM 11.0  8.2  76.5 A.Elbow - B.Elbow 10 34 ?49 28.9         A.Elbow - Wrist                 SNC    Nerve / Sites Rec. Site Peak Lat Ref.  Amp Ref. Segments Distance Peak Diff Ref.    ms ms V V  cm ms ms  R Median, Radial - Thumb comparison     Median Wrist Thumb 4.5  5  Median Wrist - Thumb 10       Radial Wrist Thumb 2.7  4  Radial Wrist - Thumb 10          Median Wrist - Radial Wrist  1.9 ?0.5  L Median, Radial - Thumb comparison     Median Wrist Thumb 5.7  7  Median Wrist - Thumb 10       Radial Wrist Thumb 2.8  6  Radial Wrist - Thumb 10          Median Wrist - Radial Wrist  2.9 ?0.5  R Median - Orthodromic (Dig II, Mid palm)     Dig II Wrist 4.8 ?3.4 5 ?10 Dig II - Wrist 13    L  Median - Orthodromic (Dig II, Mid palm)     Dig II Wrist 5.3 ?3.4 2 ?10 Dig II - Wrist 13    R Ulnar - Orthodromic, (Dig V, Mid palm)     Dig V Wrist NR ?3.1 NR ?5 Dig V - Wrist 11    L Ulnar - Orthodromic, (Dig V, Mid palm)     Dig V Wrist NR ?3.1 NR ?5 Dig V - Wrist 44                   F  Wave    Nerve F Lat Ref.   ms ms  R Ulnar - ADM 35.5 ?32.0  L Ulnar - ADM 36.4 ?32.0         EMG full       EMG Summary Table    Spontaneous MUAP Recruitment  Muscle IA Fib PSW Fasc Other Amp Dur. Poly Pattern  L. Deltoid Normal None None None _______ Normal Normal Normal Normal  R. Deltoid Normal None None None _______ Normal Normal Normal Normal  L. Triceps brachii Normal None None None _______ Normal Normal Normal Normal  R. Triceps brachii Normal None None None _______ Normal Normal Normal Normal  L. Pronator teres Normal None None None _______ Normal Normal Normal Normal  R. Pronator teres Normal None None None _______ Normal Normal Normal Normal  L. Opponens pollicis Normal None None None _______ Normal Normal Normal Normal  R. Opponens pollicis Normal None None None _______ Normal Normal Normal Normal  L. Extensor digitorum brevis Normal None None None _______ Normal Normal Normal Normal  R. Extensor digitorum brevis Normal None None None _______ Normal Normal Normal Normal  L. Cervical paraspinals (low) Normal None None None _______ Normal Normal Normal Normal  R. Cervical paraspinals (low) Normal None None None _______ Normal Normal Normal Normal  L. Flexor digitorum profundus (Ulnar) Normal None 3+ None _______ Normal Normal Normal Reduced  R. Flexor digitorum profundus (Ulnar) Normal None None None _______ Normal Normal Normal Normal  L. First dorsal interosseous Normal None 3+ None _______ Normal Normal  Normal Reduced  R. First dorsal interosseous Normal None None None _______ Normal Normal Normal Normal  Bilat. Extensor indicis proprius Normal None None None _______ Normal Normal  Normal Normal

## 2018-10-13 NOTE — Procedures (Signed)
Full Name: [name] Gender: Male MRN #: [id number] Date of Birth: 2061/03/03    Visit Date: 10/07/18 10:08 Age: 57 Years 6 Months Old Examining Physician: Naomie Dean, MD   History: Hand numbness and tingling  Summary: EMG/NCS performed on the bilateral upper extremities  The right median APB motor nerve showed prolonged distal onset latency (4.6 ms, N<4.4). The right Median 2nd Digit orthodromic sensory nerve showed prolonged distal peak latency (4.8 ms, N<3.4) and reduced amplitude(5 uV, N>10)  The left  median APB motor nerve showed prolonged distal onset latency (5.1 ms, N<4.4). The left  Median 2nd Digit orthodromic sensory nerve showed prolonged distal peak latency (5.3 ms, N<3.4) and reduced amplitude(2 uV, N>10)  The right Ulnar ADM motor nerve showed decreased conduction velocity (A Elbow-B Elbow, 29 m/s, N>49) with a 33m/s drop across the elbow section (N<70m/s).  The right Ulnar 5th digit sensory nerve showed NR.  The left Ulnar ADM motor nerve showed decreased conduction velocity (A Elbow-B Elbow, 34 m/s, N>49) with a 35m/s drop across the elbow section (N<85m/s).  The left  Ulnar 5th digit sensory nerve showed NR  F Wave studies indicate that the right Ulnar F wave has delayed latency(35.5, N<28ms) and the left Ulnar F wave has delayed latency(36.4, N<66ms)  The right median/radial comparison nerve showed prolonged distal peak latency (Median Palm, 4.5 ms, N<2.2) and abnormal peak latency difference (Median Palm-Ulnar Palm, 1.9 ms, N<0.5) with a relative median delay.    The left median/radial comparison nerve showed prolonged distal peak latency (Median Palm, 5.7 ms, N<2.2) and abnormal peak latency difference (Median Palm-Ulnar Palm, 2.9 ms, N<0.5) with a relative median delay.  The left flexor digitorum profundus(Ulnar head) and left first dorsal interosseous muscles showed increased spontaneous activity (3+ positive sharp waves) and reduced motor unit  recruitment.     Conclusion: 1. There is electrophysiologic evidence for severe left ulnar neuropathy at the elbow and moderately-severe right ulnar neuropathy at the elbow. 2. There is also concomitant bilateral moderately-severe Carpal Tunnel Syndrome. 3. No indication of cervical radiculopathy.   Naomie Dean M.D.  [facility] [address] [place], [place] [address] Tel: [phone] Fax: [phone]        [facility]    Nerve / Sites Muscle Latency Ref. Amplitude Ref. Rel Amp Segments Distance Velocity Ref. Area    ms ms mV mV %  cm m/s m/s mVms  R Median - APB     Wrist APB 4.6 ?4.4 5.9 ?4.0 100 Wrist - APB 7   16.8     Upper arm APB 9.1  5.7  97.3 Upper arm - Wrist 23 51 ?49 18.6  L Median - APB     Wrist APB 5.1 ?4.4 7.3 ?4.0 100 Wrist - APB 7   29.3     Upper arm APB 9.8  7.0  96.7 Upper arm - Wrist 23 49 ?49 29.0  R Ulnar - ADM     Wrist ADM 3.1 ?3.3 7.9 ?6.0 100 Wrist - ADM 7   26.5     B.Elbow ADM 8.2  6.2  78 B.Elbow - Wrist 25 49 ?49 21.0     A.Elbow ADM 11.7  3.6  58 A.Elbow - B.Elbow 10 29 ?49 12.1         A.Elbow - Wrist      L Ulnar - ADM     Wrist ADM 3.1 ?3.3 11.1 ?6.0 100 Wrist - ADM 7  40.8     B.Elbow ADM 8.1  10.7  96.4 B.Elbow - Wrist 25 50 ?49 39.5     A.Elbow ADM 11.0  8.2  76.5 A.Elbow - B.Elbow 10 34 ?49 28.9         A.Elbow - Wrist                 SNC    Nerve / Sites Rec. Site Peak Lat Ref.  Amp Ref. Segments Distance Peak Diff Ref.    ms ms V V  cm ms ms  R Median, Radial - Thumb comparison     Median Wrist Thumb 4.5  5  Median Wrist - Thumb 10       Radial Wrist Thumb 2.7  4  Radial Wrist - Thumb 10          Median Wrist - Radial Wrist  1.9 ?0.5  L Median, Radial - Thumb comparison     Median Wrist Thumb 5.7  7  Median Wrist - Thumb 10       Radial Wrist Thumb 2.8  6  Radial Wrist - Thumb 10          Median Wrist - Radial Wrist  2.9 ?0.5  R Median - Orthodromic (Dig II, Mid palm)     Dig II Wrist 4.8 ?3.4 5 ?10 Dig II -  Wrist 13    L Median - Orthodromic (Dig II, Mid palm)     Dig II Wrist 5.3 ?3.4 2 ?10 Dig II - Wrist 13    R Ulnar - Orthodromic, (Dig V, Mid palm)     Dig V Wrist NR ?3.1 NR ?5 Dig V - Wrist 11    L Ulnar - Orthodromic, (Dig V, Mid palm)     Dig V Wrist NR ?3.1 NR ?5 Dig V - Wrist 67                   F  Wave    Nerve F Lat Ref.   ms ms  R Ulnar - ADM 35.5 ?32.0  L Ulnar - ADM 36.4 ?32.0         EMG full       EMG Summary Table    Spontaneous MUAP Recruitment  Muscle IA Fib PSW Fasc Other Amp Dur. Poly Pattern  L. Deltoid Normal None None None _______ Normal Normal Normal Normal  R. Deltoid Normal None None None _______ Normal Normal Normal Normal  L. Triceps brachii Normal None None None _______ Normal Normal Normal Normal  R. Triceps brachii Normal None None None _______ Normal Normal Normal Normal  L. Pronator teres Normal None None None _______ Normal Normal Normal Normal  R. Pronator teres Normal None None None _______ Normal Normal Normal Normal  L. Opponens pollicis Normal None None None _______ Normal Normal Normal Normal  R. Opponens pollicis Normal None None None _______ Normal Normal Normal Normal  L. Extensor digitorum brevis Normal None None None _______ Normal Normal Normal Normal  R. Extensor digitorum brevis Normal None None None _______ Normal Normal Normal Normal  L. Cervical paraspinals (low) Normal None None None _______ Normal Normal Normal Normal  R. Cervical paraspinals (low) Normal None None None _______ Normal Normal Normal Normal  L. Flexor digitorum profundus (Ulnar) Normal None 3+ None _______ Normal Normal Normal Reduced  R. Flexor digitorum profundus (Ulnar) Normal None None None _______ Normal Normal Normal Normal  L. First dorsal interosseous Normal None 3+ None _______ Normal Normal  Normal Reduced  R. First dorsal interosseous Normal None None None _______ Normal Normal Normal Normal  Bilat. Extensor indicis proprius Normal None None None _______  Normal Normal Normal Normal

## 2018-10-13 NOTE — Progress Notes (Signed)
Discussed with patient that he has bilateral Ulnar Neuropathy at the elbows, left > right. This corresponds with patients symptoms which nclude significant left 5th digit numbness as well as right 5th digit numbness as his biggest complaints. He also reports paresthesias in digits 1-4 which corresponds to the bilateral carpal tunnel syndrome found. Radial-innervated C8 distal muscles were normal and cervical paraspinals did not show denervation which suggests there is no cervical radicular component. Discussed with patient, options, he would like to be referred to Dr. Amanda Pea at [facility] fr surgical options.  Orders Placed This Encounter  Procedures  . Ambulatory referral to Orthopedic Surgery   A total of 15 minutes was spent face-to-face with this patient. Over half this time was spent on counseling patient on the  1. Ulnar neuropathy of both upper extremities   2. Bilateral carpal tunnel syndrome     diagnosis and different diagnostic and therapeutic options, counseling and coordination of care, risks ans benefits of management, compliance, or risk factor reduction and education.  This does not include time spent on emg/ncs

## 2018-10-15 ENCOUNTER — Ambulatory Visit (INDEPENDENT_AMBULATORY_CARE_PROVIDER_SITE_OTHER): Payer: BLUE CROSS/BLUE SHIELD | Admitting: Pharmacist Clinician (PhC)/ Clinical Pharmacy Specialist

## 2018-10-15 DIAGNOSIS — Z7901 Long term (current) use of anticoagulants: Secondary | ICD-10-CM | POA: Diagnosis not present

## 2018-10-15 DIAGNOSIS — I4819 Other persistent atrial fibrillation: Secondary | ICD-10-CM

## 2018-10-15 LAB — POCT INR: INR: 3.8 — AB (ref 2.0–3.0)

## 2018-10-15 NOTE — Patient Instructions (Signed)
Description   No warfarin today Friday Nov 8, then continue with 1 tablet daily except 1.5 tablets each Wednesday and Friday.  Repeat INR in 4 weeks.

## 2018-10-18 ENCOUNTER — Telehealth: Payer: Self-pay | Admitting: Neurology

## 2018-10-18 NOTE — Telephone Encounter (Signed)
Patient is scheduled with Dr. Amanda Pea 12/15/18 at 2:15 Patient is aware

## 2018-11-09 DIAGNOSIS — Z0271 Encounter for disability determination: Secondary | ICD-10-CM

## 2018-11-18 ENCOUNTER — Ambulatory Visit (INDEPENDENT_AMBULATORY_CARE_PROVIDER_SITE_OTHER): Payer: BLUE CROSS/BLUE SHIELD | Admitting: Pharmacist

## 2018-11-18 DIAGNOSIS — Z7901 Long term (current) use of anticoagulants: Secondary | ICD-10-CM | POA: Diagnosis not present

## 2018-11-18 DIAGNOSIS — I4819 Other persistent atrial fibrillation: Secondary | ICD-10-CM

## 2018-11-18 LAB — POCT INR: INR: 2 (ref 2.0–3.0)

## 2018-11-26 ENCOUNTER — Ambulatory Visit (INDEPENDENT_AMBULATORY_CARE_PROVIDER_SITE_OTHER): Payer: BLUE CROSS/BLUE SHIELD | Admitting: Cardiology

## 2018-11-26 ENCOUNTER — Encounter: Payer: Self-pay | Admitting: Cardiology

## 2018-11-26 VITALS — BP 138/90 | HR 104 | Ht 74.0 in | Wt 264.0 lb

## 2018-11-26 DIAGNOSIS — Z9861 Coronary angioplasty status: Secondary | ICD-10-CM | POA: Diagnosis not present

## 2018-11-26 DIAGNOSIS — Z7901 Long term (current) use of anticoagulants: Secondary | ICD-10-CM | POA: Diagnosis not present

## 2018-11-26 DIAGNOSIS — G5603 Carpal tunnel syndrome, bilateral upper limbs: Secondary | ICD-10-CM | POA: Diagnosis not present

## 2018-11-26 DIAGNOSIS — F17210 Nicotine dependence, cigarettes, uncomplicated: Secondary | ICD-10-CM

## 2018-11-26 DIAGNOSIS — F1010 Alcohol abuse, uncomplicated: Secondary | ICD-10-CM

## 2018-11-26 DIAGNOSIS — I10 Essential (primary) hypertension: Secondary | ICD-10-CM

## 2018-11-26 DIAGNOSIS — J439 Emphysema, unspecified: Secondary | ICD-10-CM

## 2018-11-26 DIAGNOSIS — I2510 Atherosclerotic heart disease of native coronary artery without angina pectoris: Secondary | ICD-10-CM | POA: Diagnosis not present

## 2018-11-26 DIAGNOSIS — J449 Chronic obstructive pulmonary disease, unspecified: Secondary | ICD-10-CM | POA: Insufficient documentation

## 2018-11-26 DIAGNOSIS — I4819 Other persistent atrial fibrillation: Secondary | ICD-10-CM

## 2018-11-26 NOTE — Assessment & Plan Note (Signed)
Failed OP DCCV Sept 2018- rate control

## 2018-11-26 NOTE — Assessment & Plan Note (Signed)
Evaluated Oct 2019- sounds like he will need surgery

## 2018-11-26 NOTE — Assessment & Plan Note (Addendum)
Moderate COPD- evaluated by Dr Isaiah SergeMannam Aug 2019 Still smoking 1 PPD

## 2018-11-26 NOTE — Addendum Note (Signed)
Addended by: Kandice RobinsonsYOUNG, Dominyck Reser T on: 11/26/2018 11:35 AM   Modules accepted: Orders

## 2018-11-26 NOTE — Progress Notes (Addendum)
11/26/2018 [name]   [birth year]  [id number]  Primary Physician Natalia LeatherwoodKuneff, Renee A, DO Primary Cardiologist: Dr Antoine PocheHochrein  HPI:  57 y/o male who we saw in early Sept 2018 with hypotension and AF with RVR.  When he came in early Sept 2018 his medications were adjusted and he was placed on anticoagulation.  He underwent OP DCCV 09/04/17 but quickly went back into AF with RVR.  The pt has a history of binge ETOH, 1PPD smoking, and uncontrolled B/P. He also reported he had been drinking a gallon of sweet tea a day. He was placed on anticoagulation and instructed to modify his diet and decrease ETOH and stop smoking. A sleep study was obtained but did not reveal significant sleep apnea.   He was then seen in April 2019 with complaints of exertional chest pain concerning for angina. Myoview 03/23/18 was abnormal with a large anterior defect.  Diagnostic cath done 03/30/18 showed 90% mCFX lesion which was treated with a DES.  There was moderate residual non obstructive RCA disease.  When seen in May 2019 in follow up he was still having some chest pain and Dr Antoine PocheHochrein referred him for pulmonary evaluation.  He did not think the patients chest pain was angina.  He was seen by Dr Isaiah SergeMannam in Aug 2019 and has been diagnosed with moderate COPD.  Dr Antoine PocheHochrein has recommended rate control and anticoagulation for his AF, (pt was not interested in Tikosyn and repeat cardioversion).   He is in the office today for follow up. He continues to smoke 1PP.  He couldn't tolerate Chantix.  He has been diagnosed with carpal tunnel syndrome and will need to see a surgeon.  He is working getting disability. He is limited in what he can do, he says he gives out, SOB, after walking 200 feet.     Current Outpatient Medications  Medication Sig Dispense Refill  . acetaminophen (TYLENOL) 500 MG tablet Take 1 tablet (500 mg total) by mouth daily as needed for moderate pain or headache. (Patient not taking: Reported on 11/26/2018)  30 tablet 0  . albuterol (PROVENTIL HFA;VENTOLIN HFA) 108 (90 Base) MCG/ACT inhaler Inhale 2 puffs into the lungs every 6 (six) hours as needed for wheezing or shortness of breath. (Patient not taking: Reported on 11/26/2018) 18 g 11  . atorvastatin (LIPITOR) 80 MG tablet TAKE 1 TABLET (80 MG TOTAL) BY MOUTH DAILY AT 6 PM. 90 tablet 1  . budesonide-formoterol (SYMBICORT) 160-4.5 MCG/ACT inhaler Inhale 2 puffs into the lungs 2 (two) times daily. 1 Inhaler 11  . clopidogrel (PLAVIX) 75 MG tablet Take 1 tablet (75 mg total) by mouth daily with breakfast. 30 tablet 11  . diltiazem (CARDIZEM CD) 240 MG 24 hr capsule TAKE 1 CAPSULE (240 MG TOTAL) DAILY BY MOUTH. 90 capsule 1  . ePHEDrine-GuaiFENesin (BRONKAID) 25-400 MG TABS Take 1 tablet by mouth daily as needed (for shortness of breath/congestion).     Marland Kitchen. losartan (COZAAR) 50 MG tablet TAKE 1 TABLET BY MOUTH EVERY DAY 90 tablet 1  . metoprolol succinate (TOPROL-XL) 100 MG 24 hr tablet TAKE 1 TABLET BY MOUTH TWICE A DAY 180 tablet 2  . nitroGLYCERIN (NITROSTAT) 0.4 MG SL tablet Place 1 tablet (0.4 mg total) under the tongue every 5 (five) minutes as needed for chest pain. 25 tablet 2  . pantoprazole (PROTONIX) 40 MG tablet TAKE 1 TABLET BY MOUTH EVERY DAY 90 tablet 1  . spironolactone (ALDACTONE) 25 MG tablet TAKE 1-2 TABLETS BY  MOUTH DAILY AS DIRECTED 90 tablet 2  . warfarin (COUMADIN) 5 MG tablet TAKE 1 TO 1 AND 1/2 TABLETS DAILY AS ORDERED BY COUMADIN CLINIC 135 tablet 0   No current facility-administered medications for this visit.     No Known Allergies  Past Medical History:  Diagnosis Date  . Adrenal cyst (HCC)   . Anemia    "when I was a little boy" (03/30/2018)  . ARF (acute renal failure) (HCC) 08/13/2017  . Arthritis    "hands" (03/30/2018)  . Atrial fibrillation (HCC)   . Chronic bronchitis (HCC)   . Emphysema of lung (HCC)    "early onset" (03/30/2018)  . High cholesterol   . History of gout   . History of hiatal hernia   .  Hypertension   . Persistent atrial fibrillation with rapid ventricular response 08/12/2017  . Pneumothorax ~ 2002  . Positive TB test   . Sleep apnea    pt states CPAP not needed based on results of sleep study  . Tobacco use 1973    Social History   Socioeconomic History  . Marital status: Married    Spouse name: [name]  . Number of children: Not on file  . Years of education: 58  . Highest education level: Not on file  Occupational History  . Occupation: Retail banker  Social Needs  . Financial resource strain: Not on file  . Food insecurity:    Worry: Not on file    Inability: Not on file  . Transportation needs:    Medical: Not on file    Non-medical: Not on file  Tobacco Use  . Smoking status: Current Every Day Smoker    Packs/day: 1.00    Years: 45.00    Pack years: 45.00    Types: Cigarettes  . Smokeless tobacco: Never Used  Substance and Sexual Activity  . Alcohol use: Yes    Alcohol/week: 6.0 standard drinks    Types: 6 Cans of beer per week  . Drug use: No  . Sexual activity: Yes    Partners: Female    Birth control/protection: None    Comment: married  Lifestyle  . Physical activity:    Days per week: Not on file    Minutes per session: Not on file  . Stress: Not on file  Relationships  . Social connections:    Talks on phone: Not on file    Gets together: Not on file    Attends religious service: Not on file    Active member of club or organization: Not on file    Attends meetings of clubs or organizations: Not on file    Relationship status: Not on file  . Intimate partner violence:    Fear of current or ex partner: Not on file    Emotionally abused: Not on file    Physically abused: Not on file    Forced sexual activity: Not on file  Other Topics Concern  . Not on file  Social History Narrative   married to [place].   15 years education. Aircraft heavy Insurance account manager.    Drinks caffeine   Wears a seatbelt. Smoke detector in the  home.   Wears dentures.   Feels safe in relationships.     Family History  Problem Relation Age of Onset  . Arthritis Mother   . Alcohol abuse Father   . Mental illness Father   . Early death Father   . Arthritis Brother   . Heart disease  Brother        open heart surgery  . Lymphoma Brother      Review of Systems: General: negative for chills, fever, night sweats or weight changes.  Cardiovascular: negative for edema, orthopnea, palpitations, paroxysmal nocturnal dyspnea  Dermatological: negative for rash Respiratory: negative for cough or wheezing Urologic: negative for hematuria Abdominal: negative for nausea, vomiting, diarrhea, bright red blood per rectum, melena, or hematemesis Neurologic: negative for visual changes, syncope, or dizziness All other systems reviewed and are otherwise negative except as noted above.    Blood pressure 138/90, pulse (!) 104, height 6\' 2"  (1.88 m), weight 264 lb (119.7 kg).  General appearance: alert, cooperative, appears older than stated age and no distress Lungs: decreased breath sounds Heart: irregularly irregular rhythm Extremities: no edema Skin: cool, moist Neurologic: Grossly normal  EKG AF with VR 100  ASSESSMENT AND PLAN:   CAD S/P percutaneous coronary angioplasty CFX PCI with DES April 2019 after he related a history of exertional chest tightness and had an abnormal Myoview. He has continued chest pain not felt to be angina  Persistent atrial fibrillation Failed OP DCCV Sept 2018- rate control  Essential hypertension Fair control  COPD (chronic obstructive pulmonary disease) (HCC) Moderate COPD- evaluated by Dr Isaiah SergeMannam Aug 2019 Still smoking 1 PPD  Chronic anticoagulation CHA2Ds Vasc= 4. He is currently on Warfarin and keeping his INR follow up appointments  Carpal tunnel syndrome Evaluated Oct 2019- sounds like he will need surgery   PLAN  I think his DOE is primarily secondary to COPD and smoking. Mr Tenny CrawRoss  was still taking ASA, Plavix, and Coumadin I stopped his ASA. He is on a high dose of Toprol for rate control, if he develops wheezing this will need to be adjusted. Check labs today.  Keep f/u with Dr Antoine PocheHochrein as scheduled.   Corine ShelterLuke Alcus Bradly PA-C 11/26/2018 11:18 AM

## 2018-11-26 NOTE — Assessment & Plan Note (Signed)
CHA2Ds Vasc= 4. He is currently on Warfarin and keeping his INR follow up appointments

## 2018-11-26 NOTE — Assessment & Plan Note (Signed)
Fair control.

## 2018-11-26 NOTE — Assessment & Plan Note (Signed)
CFX PCI with DES April 2019 after he related a history of exertional chest tightness and had an abnormal Myoview. He has continued chest pain not felt to be angina

## 2018-11-26 NOTE — Patient Instructions (Signed)
Medication Instructions:  DISCONTINUE Aspirin If you need a refill on your cardiac medications before your next appointment, please call your pharmacy.   Lab work: Your physician recommends that you return for lab work in: TODAY-BMET, CBC If you have labs (blood work) drawn today and your tests are completely normal, you will receive your results only by: Marland Kitchen. MyChart Message (if you have MyChart) OR . A paper copy in the mail If you have any lab test that is abnormal or we need to change your treatment, we will call you to review the results.  Testing/Procedures: NONE  Follow-Up: At [facility], you and your health needs are our priority.  As part of our continuing mission to provide you with exceptional heart care, we have created designated Provider Care Teams.  These Care Teams include your primary Cardiologist (physician) and Advanced Practice Providers (APPs -  Physician Assistants and Nurse Practitioners) who all work together to provide you with the care you need, when you need it. You will need a follow up appointment in 6 months.  Please call our office 2 months (APRIL 2020) in advance to schedule this appointment.  You may see Dr Antoine PocheHochrein or one of the following Advanced Practice Providers on your designated Care Team:   Theodore DemarkRhonda Barrett, PA-C . Joni ReiningKathryn Lawrence, DNP, ANP  Any Other Special Instructions Will Be Listed Below (If Applicable).

## 2018-11-27 LAB — CBC
Hematocrit: 48.5 % (ref 37.5–51.0)
Hemoglobin: 16.4 g/dL (ref 13.0–17.7)
MCH: 30.7 pg (ref 26.6–33.0)
MCHC: 33.8 g/dL (ref 31.5–35.7)
MCV: 91 fL (ref 79–97)
Platelets: 203 10*3/uL (ref 150–450)
RBC: 5.35 x10E6/uL (ref 4.14–5.80)
RDW: 14.4 % (ref 12.3–15.4)
WBC: 9.5 10*3/uL (ref 3.4–10.8)

## 2018-11-27 LAB — BASIC METABOLIC PANEL
BUN/Creatinine Ratio: 17 (ref 9–20)
BUN: 22 mg/dL (ref 6–24)
CO2: 22 mmol/L (ref 20–29)
Calcium: 10.5 mg/dL — ABNORMAL HIGH (ref 8.7–10.2)
Chloride: 101 mmol/L (ref 96–106)
Creatinine, Ser: 1.28 mg/dL — ABNORMAL HIGH (ref 0.76–1.27)
GFR calc Af Amer: 70 mL/min/{1.73_m2} (ref >59)
GFR calc non Af Amer: 61 mL/min/{1.73_m2} (ref >59)
Glucose: 94 mg/dL (ref 65–99)
Potassium: 5.4 mmol/L — ABNORMAL HIGH (ref 3.5–5.2)
Sodium: 138 mmol/L (ref 134–144)

## 2018-12-15 DIAGNOSIS — G5601 Carpal tunnel syndrome, right upper limb: Secondary | ICD-10-CM | POA: Diagnosis not present

## 2018-12-15 DIAGNOSIS — G5621 Lesion of ulnar nerve, right upper limb: Secondary | ICD-10-CM | POA: Diagnosis not present

## 2018-12-15 DIAGNOSIS — G5622 Lesion of ulnar nerve, left upper limb: Secondary | ICD-10-CM | POA: Diagnosis not present

## 2018-12-15 DIAGNOSIS — G5602 Carpal tunnel syndrome, left upper limb: Secondary | ICD-10-CM | POA: Diagnosis not present

## 2018-12-22 ENCOUNTER — Telehealth: Payer: Self-pay

## 2018-12-22 NOTE — Telephone Encounter (Signed)
Primary Cardiologist: [facility] Pre-operative Risk Assessment    Request for surgical clearance:  1. What type of surgery is being performed?  Left carpal tunnel release and cubital tunnel release   2. When is this surgery scheduled? TBD   3. What type of clearance is required (medical clearance vs. Pharmacy clearance to hold med vs. Both)? Both  4. Are there any medications that need to be held prior to surgery and how long? Not listed    5. Practice name and name of physician performing surgery?  [facility]/ Dr.Gramig   6. What is your office phone number [phone]    7.   What is your office fax number [phone]  8.   Anesthesia type (None, local, MAC, general) ? Choice    Lamar Laundry 12/22/2018, 12:17 PM  _________________________________________________________________   (provider comments below)

## 2018-12-28 NOTE — Telephone Encounter (Signed)
Dr Antoine Poche can you comment on holding Plavix and Coumadin in this patient prior to carpal tunnel release- he is s/p PCI with DES April 2019  Please respond to CV DIV PRE OP  Chalee Hirota PA-C 12/28/2018 3:28 PM

## 2019-01-02 NOTE — Telephone Encounter (Signed)
He can hold his Plavix for 5 days.  Needs, however to be on ASA.  Can hold his warfarin without bridging for the surgery.

## 2019-01-03 NOTE — Telephone Encounter (Signed)
Pt takes warfarin for afib with CHADS2VASc score of 2 (HTN, CAD). Ok to hold warfarin for up to 5 days as needed prior to procedure.

## 2019-01-03 NOTE — Telephone Encounter (Signed)
   Primary Cardiologist: Rollene Rotunda, MD  Chart reviewed as part of pre-operative protocol coverage.   Left message for patient to call back for further preoperative assessment.   Beatriz Stallion, PA-C 01/03/2019, 9:51 AM

## 2019-01-04 ENCOUNTER — Ambulatory Visit: Payer: Self-pay | Admitting: Pharmacist

## 2019-01-04 NOTE — Telephone Encounter (Signed)
Talked to patient today 01/04/2019. He recently moved to [facility] and will cancel procedure.

## 2019-01-10 ENCOUNTER — Telehealth: Payer: Self-pay | Admitting: Cardiology

## 2019-01-10 NOTE — Telephone Encounter (Signed)
New Message   Dr Ave Filter is calling to speak with Dr Antoine Poche or Corine Shelter  Please call

## 2019-01-10 NOTE — Telephone Encounter (Signed)
Please advise 

## 2019-01-11 NOTE — Telephone Encounter (Signed)
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 He knows that the patient is not able to do his current job requirements due to having to heavy lift, but Dr.Chandler is wanting to be made aware if he can do light activity (which includes walking around), or only desk duty (sitting majority of the day). His number is: [phone].

## 2019-01-11 NOTE — Telephone Encounter (Signed)
Not sure who he is and I need a phone number  [address]

## 2019-01-11 NOTE — Telephone Encounter (Signed)
That's a question for Dr Rexford Maus route to him  Corine Shelter PA-C 01/11/2019 11:04 AM

## 2019-01-11 NOTE — Telephone Encounter (Signed)
I contacted Dr.Chandler, he was driving at the time, and asked for me to call back to discuss patient so I can pass message along with Franky MachoLuke, and Dr.Hochrein.

## 2019-01-11 NOTE — Telephone Encounter (Signed)
Dr.Hochrein can you please advise, you are primary cardiologist.  Thank you!

## 2019-01-12 NOTE — Telephone Encounter (Signed)
Dr.Chandler is calling back today. I advised I had sent it to Dr.Hochrein, but he was in a different office today, and would return. I advised I would send a message to his nurse and notify her to let Dr.Hochrein know and make him aware.  Thank you!

## 2019-01-12 NOTE — Telephone Encounter (Signed)
I don't think he can do any walking with his chronic dyspnea.  He has carpal tunnel so I am not sure about the writing.  I would not be able to comment on that.

## 2019-01-14 NOTE — Telephone Encounter (Signed)
Dr Antoine Poche recommendations faxed to Dr Ave Filter via faxed machine.Marland Kitchen

## 2019-02-15 ENCOUNTER — Other Ambulatory Visit: Payer: Self-pay | Admitting: Cardiology

## 2019-02-15 MED ORDER — NITROGLYCERIN 0.4 MG SL SUBL: 0.4000 mg | SUBLINGUAL_TABLET | SUBLINGUAL | 2 refills | Status: AC | PRN

## 2019-02-15 MED ORDER — CLOPIDOGREL BISULFATE 75 MG PO TABS: 75.0000 mg | ORAL_TABLET | Freq: Every day | ORAL | 3 refills | Status: DC

## 2019-02-15 MED ORDER — ATORVASTATIN CALCIUM 80 MG PO TABS: 80.0000 mg | ORAL_TABLET | Freq: Every day | ORAL | 1 refills | Status: DC

## 2019-02-15 MED ORDER — WARFARIN SODIUM 5 MG PO TABS: ORAL_TABLET | ORAL | 0 refills | Status: DC

## 2019-02-15 MED ORDER — LOSARTAN POTASSIUM 50 MG PO TABS: 50.0000 mg | ORAL_TABLET | Freq: Every day | ORAL | 1 refills | Status: DC

## 2019-02-15 NOTE — Telephone Encounter (Signed)
Pt switched to COBRA, and is Out of all of his medication    *STAT* If patient is at the pharmacy, call can be transferred to refill team.   1. Which medications need to be refilled? (please list name of each medication and dose if known)  atorvastatin (LIPITOR) 80 MG tablet clopidogrel (PLAVIX) 75 MG tablet losartan (COZAAR) 50 MG tablet nitroGLYCERIN (NITROSTAT) 0.4 MG SL tablet warfarin (COUMADIN) 5 MG tablet  2. Which pharmacy/location (including street and city if local pharmacy) is medication to be sent to? [id number] - [place], [place] - [address]  3. Do they need a 30 day or 90 day supply? 90

## 2019-02-24 ENCOUNTER — Telehealth: Payer: Self-pay | Admitting: Cardiology

## 2019-02-24 NOTE — Telephone Encounter (Signed)
Attempted to call pt regarding upcoming appointment with Dr. Antoine Poche. There was no answer. Left voice message to return call to [place] office   Georgie Chard NP-C [facility] Pager: [phone]

## 2019-03-01 ENCOUNTER — Ambulatory Visit: Payer: BLUE CROSS/BLUE SHIELD | Admitting: Cardiology

## 2019-03-11 ENCOUNTER — Other Ambulatory Visit: Payer: Self-pay | Admitting: Cardiology

## 2019-03-11 NOTE — Telephone Encounter (Signed)
[name] can you please address because pt way overdue but appt not till April 20

## 2019-03-21 ENCOUNTER — Other Ambulatory Visit: Payer: Self-pay | Admitting: Cardiology

## 2019-03-22 ENCOUNTER — Telehealth: Payer: Self-pay

## 2019-03-22 NOTE — Telephone Encounter (Signed)
Virtual Visit Pre-Appointment Phone Call  Steps For Call: VERBAL CONSENT BY PHONE   1. Confirm consent - "In the setting of the current Covid19 crisis, you are scheduled for a (phone or video) visit with your provider on (date) at (time).  Just as we do with many in-office visits, in order for you to participate in this visit, we must obtain consent.  If you'd like, I can send this to your mychart (if signed up) or email for you to review.  Otherwise, I can obtain your verbal consent now.  All virtual visits are billed to your insurance company just like a normal visit would be.  By agreeing to a virtual visit, we'd like you to understand that the technology does not allow for your provider to perform an examination, and thus may limit your provider's ability to fully assess your condition.  Finally, though the technology is pretty good, we cannot assure that it will always work on either your or our end, and in the setting of a video visit, we may have to convert it to a phone-only visit.  In either situation, we cannot ensure that we have a secure connection.  Are you willing to proceed?" STAFF: Did the patient verbally acknowledge consent to telehealth visit? Document YES/NO  2. Confirm the BEST phone number to call the day of the visit:   3. Give patient instructions for WebEx/MyChart download to smartphone as below or Doximity/Doxy.me if video visit (depending on what platform provider is using)  4. Advise patient to be prepared with any vital sign or heart rhythm information, their current medicines, and a piece of paper and pen handy for any instructions they may receive the day of their visit  5. Inform patient they will receive a phone call 15 minutes prior to their appointment time (may be from unknown caller ID) so they should be prepared to answer  6. Confirm that appointment type is correct in Epic appointment notes (video vs telephone)     TELEPHONE CALL NOTE  [name]  has been deemed a candidate for a follow-up tele-health visit to limit community exposure during the Covid-19 pandemic. I spoke with the patient via phone to ensure availability of phone/video source, confirm preferred email & phone number, and discuss instructions and expectations.  I reminded Juan PottsMartin L Jordan to be prepared with any vital sign and/or heart rhythm information that could potentially be obtained via home monitoring, at the time of his visit. I reminded Juan PottsMartin L Jordan to expect a phone call at the time of his visit if his visit.  Chana BodeStacy L Briyanna Billingham, CMA 03/22/2019 1:47 PM   DOWNLOADING THE WEBEX APP TO SMARTPHONE  - If Apple, ask patient to go to [employer] Store and type in WebEx in the search bar. Download [employer] CorporationWebex Meetings, the blue/Kyrra Prada circle. If Android, go to Universal Health[employer]ogle Play Store and type in [employer] W[employer]ley [employer] CompanyWebEx in the search bar. The app is free but as with any other app downloads, their phone may require them to verify saved payment information or Apple/Android password.  - The patient does NOT have to create an account. - On the day of the visit, the assist will walk the patient through joining the meeting with the meeting number/password.  DOWNLOADING THE MYCHART APP TO SMARTPHONE  - If Apple, go to [employer] Store and type in MyChart in the search bar and download the app. If Android, ask patient to go to [employer]ogle Play Store and type in AllstateMyChart  in the search bar and download the app. The app is free but as with any other app downloads, their phone may require them to verify saved payment information or Apple/Android password.  - The patient will need to then log into the app with their MyChart username and password, and select [facility] as their healthcare provider to link the account. When it is time for your visit, go to the MyChart app, find appointments, and click Begin Video Visit. Be sure to Select Allow for your device to access the Microphone and Camera for your visit. You will then be connected, and  your provider will be with you shortly.  **If they have any issues connecting, or need assistance please contact MyChart service desk [phone])**  **If using a computer, in order to ensure the best quality for your visit they will need to use either of the following Internet Browsers:icrosoft Edge, or Google Chrome**  FULL LENGTH CONSENT FOR TELE-HEALTH VISIT   I hereby voluntarily request, consent and authorize [facility] and its employed or contracted physicians, physician assistants, nurse practitioners or other licensed health care professionals (the Practitioner), to provide me with telemedicine health care services (the Services") as deemed necessary by the treating Practitioner. I acknowledge and consent to receive the Services by the Practitioner via telemedicine. I understand that the telemedicine visit will involve communicating with the Practitioner through live audiovisual communication technology and the disclosure of certain medical information by electronic transmission. I acknowledge that I have been given the opportunity to request an in-person assessment or other available alternative prior to the telemedicine visit and am voluntarily participating in the telemedicine visit.  I understand that I have the right to withhold or withdraw my consent to the use of telemedicine in the course of my care at any time, without affecting my right to future care or treatment, and that the Practitioner or I may terminate the telemedicine visit at any time. I understand that I have the right to inspect all information obtained and/or recorded in the course of the telemedicine visit and may receive copies of available information for a reasonable fee.  I understand that some of the potential risks of receiving the Services via telemedicine include:   Delay or interruption in medical evaluation due to technological equipment failure or disruption;  Information transmitted may not  be sufficient (e.g. poor resolution of images) to allow for appropriate medical decision making by the Practitioner; and/or   In rare instances, security protocols could fail, causing a breach of personal health information.  Furthermore, I acknowledge that it is my responsibility to provide information about my medical history, conditions and care that is complete and accurate to the best of my ability. I acknowledge that Practitioner's advice, recommendations, and/or decision may be based on factors not within their control, such as incomplete or inaccurate data provided by me or distortions of diagnostic images or specimens that may result from electronic transmissions. I understand that the practice of medicine is not an exact science and that Practitioner makes no warranties or guarantees regarding treatment outcomes. I acknowledge that I will receive a copy of this consent concurrently upon execution via email to the email address I last provided but may also request a printed copy by calling the office of [facility].    I understand that my insurance will be billed for this visit.   I have read or had this consent read to me.  I understand the contents of this consent,  which adequately explains the benefits and risks of the Services being provided via telemedicine.   I have been provided ample opportunity to ask questions regarding this consent and the Services and have had my questions answered to my satisfaction.  I give my informed consent for the services to be provided through the use of telemedicine in my medical care  By participating in this telemedicine visit I agree to the above.

## 2019-03-25 ENCOUNTER — Telehealth: Payer: Self-pay

## 2019-03-25 NOTE — Telephone Encounter (Signed)
lmom for prescreen  

## 2019-03-27 NOTE — Progress Notes (Signed)
Virtual Visit via Telephone Note   This visit type was conducted due to national recommendations for restrictions regarding the COVID-19 Pandemic (e.g. social distancing) in an effort to limit this patient's exposure and mitigate transmission in our community.  Due to his co-morbid illnesses, this patient is at least at moderate risk for complications without adequate follow up.  This format is felt to be most appropriate for this patient at this time.  All issues noted in this document were discussed and addressed.  A limited physical exam was performed with this format.  Please refer to the patient's chart for his consent to telehealth for [facility].  Of note we attempted a video visit but his phone did not have video capabilities.  This was turned into a phone note..   Evaluation Performed:  Follow-up visit  Date:  03/28/2019   ID:  [name], [name] Jun 01, 1961, MRN [id number]  Patient Location: Home Provider Location: Home  PCP:  Natalia Leatherwood, DO  Cardiologist:  Rollene Rotunda, MD  Electrophysiologist:  None   Chief Complaint:  Chest pain  History of Present Illness:    [name] is a 58 y.o. male with who presents for evaluation of AF with RVR.  When he came in early Sept2018his medications were adjusted and he was placed on anticoagulation.  He underwent OP DCCV 09/04/17 but quickly went back into AF with RVR.  He was then seen in April 2019 with complaints of exertional chest pain concerning for angina. Myoview 03/23/18 was abnormal with a large anterior defect.  Diagnostic cath done 03/30/18 showed 90% mCFX lesion which was treated with a DES.  There was moderate residual non obstructive RCA disease.  When seen in May 2019 in follow up he was still having some chest pain and Dr Antoine Poche referred him for pulmonary evaluation.  He did not think the patients chest pain was angina.  He was seen by Dr Isaiah Serge in Aug 2019 and has been diagnosed with moderate COPD.  At the last  office visit with [place] he had his ASA stopped.   He does not have a camera on his phone because he painted over it.  He has not been getting any new cardiovascular symptoms.  He continues to smoke cigarettes.  He has a chronic cough.  This is unchanged from previous.  This is worse in the morning.  He calls a smoker's cough.  Is not had any fevers or chills.  He has had no exposure to the coronavirus.  He denies any new chest pressure, neck or arm discomfort.  He is continued to have a stable pattern of chest discomfort that never changed after he got his angioplasty last year.  He does not feel in his opinion like his heart.  He feels like his collapsed lung from before.  He is followed by pulmonary.  He says it is somewhat sharp and heavy.  It happens sporadically.  It can happen when he drives the garbage cans out to the road but it also might happen injection TV.  Is not been getting worse or better.  He does not want to take nitroglycerin because of it.  Of note he did notice when he ran out of spironolactone about a month ago the previous erectile dysfunction went away.  The patient does not have symptoms concerning for COVID-19 infection (fever, chills, cough, or new shortness of breath).  He has a chronic smokers cough.    Past Medical History:  Diagnosis Date   Adrenal cyst (HCC)    Anemia    "when I was a little boy" (03/30/2018)   ARF (acute renal failure) (HCC) 08/13/2017   Arthritis    "hands" (03/30/2018)   Atrial fibrillation (HCC)    Chronic bronchitis (HCC)    Emphysema of lung (HCC)    "early onset" (03/30/2018)   High cholesterol    History of gout    History of hiatal hernia    Hypertension    Persistent atrial fibrillation with rapid ventricular response 08/12/2017   Pneumothorax ~ 2002   Positive TB test    Sleep apnea    pt states CPAP not needed based on results of sleep study   Tobacco use 1973   Past Surgical History:  Procedure Laterality Date    ADRENAL GLAND SURGERY     pt reports adrenal gland removed, he is uncertain which side.    CARDIAC CATHETERIZATION     pt describes a cardiac cath procedure as being completed, states it was "clear"   CARDIOVERSION N/A 09/04/2017   Procedure: CARDIOVERSION;  Surgeon: Wendall Stade, MD;  Location: [facility] ENDOSCOPY;  Service: Cardiovascular;  Laterality: N/A;   CHEST TUBE INSERTION  ~ 2002   "collapsed lung"   CORONARY ANGIOPLASTY WITH STENT PLACEMENT  03/30/2018   CORONARY STENT INTERVENTION N/A 03/30/2018   Procedure: CORONARY STENT INTERVENTION;  Surgeon: Kathleene Hazel, MD;  Location: [facility] INVASIVE CV LAB;  Service: Cardiovascular;  Laterality: N/A;   ESOPHAGOGASTRODUODENOSCOPY     pt states it was abnormal, but he does not know why   FINGER SURGERY Right    "severed all the tendons in my right thumb; did OR to reattach"   INGUINAL HERNIA REPAIR Left 1996   LEFT HEART CATH AND CORONARY ANGIOGRAPHY N/A 03/30/2018   Procedure: LEFT HEART CATH AND CORONARY ANGIOGRAPHY;  Surgeon: Kathleene Hazel, MD;  Location: [facility] INVASIVE CV LAB;  Service: Cardiovascular;  Laterality: N/A;   NASAL HEMORRHAGE CONTROL N/A 02/22/2016   Procedure: EPISTAXIS CONTROL;  Surgeon: Serena Colonel, MD;  [facility];  Service: ENT;  Laterality: N/A;     Prior to Admission medications   Medication Sig Start Date End Date Taking? Authorizing Provider  acetaminophen (TYLENOL) 500 MG tablet Take 1 tablet (500 mg total) by mouth daily as needed for moderate pain or headache. 03/31/18  Yes Robbie Lis M, PA-C  albuterol (PROVENTIL HFA;VENTOLIN HFA) 108 (90 Base) MCG/ACT inhaler Inhale 2 puffs into the lungs every 6 (six) hours as needed for wheezing or shortness of breath. 06/23/18  Yes Kuneff, Renee A, DO  atorvastatin (LIPITOR) 80 MG tablet Take 1 tablet (80 mg total) by mouth daily at 6 PM. 02/15/19  Yes Patsy Zaragoza, Fayrene Fearing, MD  budesonide-formoterol [facility]) 160-4.5 MCG/ACT inhaler Inhale 2 puffs  into the lungs 2 (two) times daily. 06/23/18  Yes Kuneff, Renee A, DO  clopidogrel (PLAVIX) 75 MG tablet Take 1 tablet (75 mg total) by mouth daily with breakfast. 02/15/19  Yes Derrel Moore, Fayrene Fearing, MD  ePHEDrine-GuaiFENesin (BRONKAID) 25-400 MG TABS Take 1 tablet by mouth daily as needed (for shortness of breath/congestion).    Yes [provider]  losartan (COZAAR) 50 MG tablet Take 1 tablet by mouth once daily 03/21/19  Yes Tarrin Lebow, Fayrene Fearing, MD  metoprolol succinate (TOPROL-XL) 100 MG 24 hr tablet TAKE 1 TABLET BY MOUTH TWICE A DAY 06/28/18  Yes Rollene Rotunda, MD  nitroGLYCERIN (NITROSTAT) 0.4 MG SL tablet Place 1 tablet (0.4 mg total) under the  tongue every 5 (five) minutes as needed for chest pain. 02/15/19  Yes Rollene Rotunda, MD  pantoprazole (PROTONIX) 40 MG tablet TAKE 1 TABLET BY MOUTH EVERY DAY 09/24/18  Yes [place], Peter M, MD  warfarin (COUMADIN) 5 MG tablet TAKE 1 TO 1 AND 1/2 DAILY AS DIRECTED BY COUMADIN CLINIC. LAST REFILL UNTIL F/U FOR INR CHECK 03/11/19  Yes Stuart Guillen, Fayrene Fearing, MD  spironolactone (ALDACTONE) 25 MG tablet TAKE 1-2 TABLETS BY MOUTH DAILY AS DIRECTED Patient not taking: Reported on 03/28/2019 06/28/18   Rollene Rotunda, MD      Allergies:   Patient has no known allergies.   Social History   Tobacco Use   Smoking status: Current Every Day Smoker    Packs/day: 1.00    Years: 45.00    Pack years: 45.00    Types: Cigarettes   Smokeless tobacco: Never Used  Substance Use Topics   Alcohol use: Yes    Alcohol/week: 6.0 standard drinks    Types: 6 Cans of beer per week   Drug use: No     Family Hx: The patient's family history includes Alcohol abuse in his father; Arthritis in his brother and mother; Early death in his father; Heart disease in his brother; Lymphoma in his brother; Mental illness in his father.  ROS:   Please see the history of present illness.    As stated in the HPI and negative for all other systems.   Prior CV studies:   The  following studies were reviewed today:  Labs  Labs/Other Tests and Data Reviewed:    EKG:  No ECG reviewed.  Recent Labs: 03/31/2018: ALT 23 11/26/2018: BUN 22; Creatinine, Ser 1.28; Hemoglobin 16.4; Platelets 203; Potassium 5.4; Sodium 138   Recent Lipid Panel No results found for: CHOL, TRIG, HDL, CHOLHDL, LDLCALC, LDLDIRECT  Wt Readings from Last 3 Encounters:  11/26/18 264 lb (119.7 kg)  08/25/18 266 lb (120.7 kg)  07/13/18 262 lb (118.8 kg)     Objective:    Vital Signs:  There were no vitals taken for this visit.     ASSESSMENT & PLAN:    CAD S/P percutaneous coronary angioplasty I had a a long discussion with the patient.  His chest pain is stable and never went away with his angioplasty.  It is likely it is not anginal.  It is unchanged over the past year.  He will continue with medical management.  We discussed risk factor modification to include stopping smoking but I am not sure that this will happen.  He does talk about having nosebleeds if he coughs a lot and he coughs a lot in the morning because of his smoking.  Therefore, I think it is most prudent to discontinue the Plavix.  I reviewed his previous catheterization.  He had no high-grade obstructive disease elsewhere.  He will continue the other meds as listed.    Persistent atrial fibrillation He has not been interested in further attempts at DCCV.  Mr. ZELIG GACEK has a CHA2DS2 - VASc score of 4.  Continue anticoagulation.   Essential hypertension He did not tolerate spironolactone previously been on Norvasc and I am to try this again starting at 5 mg once daily.   COPD (chronic obstructive pulmonary disease) (HCC) We again talked about the need to stop smoking but I am not sure that this is going to happen.   COVID-19 Education: The signs and symptoms of COVID-19 were discussed with the patient and how to seek care for  testing (follow up with PCP or arrange E-visit).  We discussed the need to wear a  mask and for hand hygiene. The importance of social distancing was discussed today.  Time:   Today, I have spent 20 minutes with the patient with telehealth technology discussing the above problems.     Medication Adjustments/Labs and Tests Ordered: Current medicines are reviewed at length with the patient today.  Concerns regarding medicines are outlined above.   Tests Ordered: No orders of the defined types were placed in this encounter.   Medication Changes: No orders of the defined types were placed in this encounter.   Disposition:  Follow up one year.   Signed, Rollene RotundaJames Eulises Kijowski, MD  03/28/2019 11:59 AM    [facility]

## 2019-03-28 ENCOUNTER — Encounter: Payer: Self-pay | Admitting: Cardiology

## 2019-03-28 ENCOUNTER — Telehealth (INDEPENDENT_AMBULATORY_CARE_PROVIDER_SITE_OTHER): Payer: BLUE CROSS/BLUE SHIELD | Admitting: Cardiology

## 2019-03-28 DIAGNOSIS — F17210 Nicotine dependence, cigarettes, uncomplicated: Secondary | ICD-10-CM

## 2019-03-28 DIAGNOSIS — I4819 Other persistent atrial fibrillation: Secondary | ICD-10-CM

## 2019-03-28 DIAGNOSIS — Z7189 Other specified counseling: Secondary | ICD-10-CM

## 2019-03-28 DIAGNOSIS — I10 Essential (primary) hypertension: Secondary | ICD-10-CM

## 2019-03-28 DIAGNOSIS — R072 Precordial pain: Secondary | ICD-10-CM

## 2019-03-28 DIAGNOSIS — Z9861 Coronary angioplasty status: Secondary | ICD-10-CM

## 2019-03-28 DIAGNOSIS — I2510 Atherosclerotic heart disease of native coronary artery without angina pectoris: Secondary | ICD-10-CM

## 2019-03-28 MED ORDER — LOSARTAN POTASSIUM 50 MG PO TABS: 50.0000 mg | ORAL_TABLET | Freq: Every day | ORAL | 3 refills | Status: DC

## 2019-03-28 MED ORDER — AMLODIPINE BESYLATE 5 MG PO TABS: 5.0000 mg | ORAL_TABLET | Freq: Every day | ORAL | 3 refills | Status: DC

## 2019-03-28 MED ORDER — PANTOPRAZOLE SODIUM 40 MG PO TBEC: 40.0000 mg | DELAYED_RELEASE_TABLET | Freq: Every day | ORAL | 3 refills | Status: DC

## 2019-03-28 MED ORDER — WARFARIN SODIUM 5 MG PO TABS: 5.0000 mg | ORAL_TABLET | ORAL | 3 refills | Status: DC

## 2019-03-28 NOTE — Patient Instructions (Signed)
Medication Instructions:  STOP- Plavix START- Amlodipine 5 mg daily  If you need a refill on your cardiac medications before your next appointment, please call your pharmacy.  Labwork: None Ordered   Testing/Procedures: None Ordered   Follow-Up: You will need a follow up appointment in 1 Year.  Please call our office 2 months in advance to schedule this appointment.  You may see Rollene Rotunda, MD or one of the following Advanced Practice Providers on your designated Care Team:   Theodore Demark, PA-C . Joni Reining, DNP, ANP      At [facility], you and your health needs are our priority.  As part of our continuing mission to provide you with exceptional heart care, we have created designated Provider Care Teams.  These Care Teams include your primary Cardiologist (physician) and Advanced Practice Providers (APPs -  Physician Assistants and Nurse Practitioners) who all work together to provide you with the care you need, when you need it.  Thank you for choosing [facility] at [facility]!!

## 2019-03-30 ENCOUNTER — Telehealth: Payer: Self-pay

## 2019-03-30 NOTE — Telephone Encounter (Signed)

## 2019-04-01 ENCOUNTER — Ambulatory Visit (INDEPENDENT_AMBULATORY_CARE_PROVIDER_SITE_OTHER): Payer: Self-pay | Admitting: Pharmacist

## 2019-04-01 ENCOUNTER — Other Ambulatory Visit: Payer: Self-pay

## 2019-04-01 DIAGNOSIS — Z5181 Encounter for therapeutic drug level monitoring: Secondary | ICD-10-CM

## 2019-04-01 LAB — POCT INR: INR: 2 (ref 2.0–3.0)

## 2019-04-11 ENCOUNTER — Telehealth: Payer: Self-pay

## 2019-04-11 MED ORDER — WARFARIN SODIUM 5 MG PO TABS: ORAL_TABLET | ORAL | 0 refills | Status: DC

## 2019-04-11 NOTE — Telephone Encounter (Signed)
90 DS w/ no refills sent to pharmacy

## 2019-04-12 ENCOUNTER — Other Ambulatory Visit: Payer: Self-pay

## 2019-04-12 MED ORDER — METOPROLOL SUCCINATE ER 100 MG PO TB24: 100.0000 mg | ORAL_TABLET | Freq: Two times a day (BID) | ORAL | 2 refills | Status: DC

## 2019-04-14 ENCOUNTER — Other Ambulatory Visit: Payer: Self-pay

## 2019-05-05 ENCOUNTER — Telehealth: Payer: Self-pay | Admitting: Pharmacist

## 2019-05-05 NOTE — Telephone Encounter (Signed)
Billing corrected, patient scheduled for 8 week INR check

## 2019-05-05 NOTE — Telephone Encounter (Signed)
April INR check was listed in Epic as [name].  I suspect that patient was looking at EOB from his insurance company.  Asked that he call back to the office so we can clarify and get him scheduled for follow up.

## 2019-05-05 NOTE — Telephone Encounter (Signed)
Called pt to move [facility] Coumadin visit back to [facility] office. Pt states he received a $16 bill in the mail after his last appt and cannot afford to come in for an INR check next week. Advised pt that if he remains on Coumadin, we will need to monitor his levels regularly and that he will need an INR check within the next month. He verbalized understanding and will call the [facility] office to schedule an appt when he is able to do so.

## 2019-05-25 ENCOUNTER — Telehealth: Payer: Self-pay

## 2019-05-25 NOTE — Telephone Encounter (Signed)
lmom for prescreen  

## 2019-07-01 ENCOUNTER — Telehealth: Payer: Self-pay

## 2019-07-01 NOTE — Telephone Encounter (Signed)
Left a detailed message for the patient informing him that I was calling to go over the [place] prescreen questions for his upcoming appointment and to give our office a call back.

## 2019-07-08 ENCOUNTER — Other Ambulatory Visit: Payer: Self-pay | Admitting: Cardiology

## 2019-07-14 ENCOUNTER — Telehealth: Payer: Self-pay

## 2019-07-14 NOTE — Telephone Encounter (Signed)
Called and someone picked up I stated I was calling the pt for an overdue inr check then the pt hung up the phone

## 2019-07-18 ENCOUNTER — Telehealth: Payer: Self-pay

## 2019-07-18 NOTE — Telephone Encounter (Signed)
lmom for overdue inr 

## 2019-07-27 NOTE — Telephone Encounter (Signed)
Called to offer the pt 1 free visit via raquel and the pt stated that they have to check with their wife about bringing them to the appt and that they will take down our information.

## 2019-07-28 ENCOUNTER — Ambulatory Visit (INDEPENDENT_AMBULATORY_CARE_PROVIDER_SITE_OTHER): Payer: Self-pay | Admitting: Pharmacist

## 2019-07-28 ENCOUNTER — Other Ambulatory Visit: Payer: Self-pay

## 2019-07-28 DIAGNOSIS — Z5181 Encounter for therapeutic drug level monitoring: Secondary | ICD-10-CM

## 2019-07-28 LAB — POCT INR: INR: 1.3 — AB (ref 2.0–3.0)

## 2019-07-28 MED ORDER — WARFARIN SODIUM 5 MG PO TABS: ORAL_TABLET | ORAL | 0 refills | Status: DC

## 2019-07-29 ENCOUNTER — Telehealth: Payer: Self-pay | Admitting: Licensed Clinical Social Worker

## 2019-07-29 NOTE — Telephone Encounter (Signed)
CSW received referral to assist patient with medication assistance due to recent loss of insurance. CSW attempted to reach patient via phone with message left. CSW will await return call. Raquel Sarna, [place], [place]

## 2019-07-29 NOTE — Telephone Encounter (Signed)
Patient returned call a shared recent loss of insurance. Patient states that he was receiving long term disability and recently started receiving SS Disability. Patient states that he was on [place] for insurance but it was discontinued and he now has no insurance. CSW provided number for [facility] information as well as encouraged patient to follow up with [facility] for advice on Medicare when eligible. Patient reports he has some medications for pick up at [facility] and unable to afford at this time. CSW will assist patient with Patient Care Fund to assist with obtaining medications. Patient grateful for the information and assistance. CSW available as needed. Raquel Sarna, [place], [place]

## 2019-08-01 ENCOUNTER — Telehealth: Payer: Self-pay | Admitting: Licensed Clinical Social Worker

## 2019-08-01 ENCOUNTER — Other Ambulatory Visit: Payer: Self-pay

## 2019-08-01 NOTE — Telephone Encounter (Signed)
CSW contacted patient to inform that Patient Care Fund assistance card was left at front desk at [employer] office. Patient states he will pick up later today and obtain needed medications. CSW will be available as needed. Raquel Sarna, [place], [place]

## 2019-08-02 ENCOUNTER — Telehealth: Payer: Self-pay | Admitting: Licensed Clinical Social Worker

## 2019-08-02 NOTE — Telephone Encounter (Signed)
CSW contacted patient to follow up on needed medications. Patient confirmed he obtained meds at [facility] with the assistance of the Patient [place]. He reports feeling better and states he is still waiting on the Albuterol but anticipates having it within the next day or so. Patient states he has multiple unpaid medical bills and no insurance. CSW provided patient with the [facility] Patient Assistance information and encouraged him to call to make application. Patient grateful for the assistance and will follow up. CSW available as needed. Raquel Sarna, [place], [place]

## 2019-08-03 ENCOUNTER — Other Ambulatory Visit: Payer: Self-pay

## 2019-08-05 ENCOUNTER — Other Ambulatory Visit: Payer: Self-pay

## 2019-08-08 ENCOUNTER — Telehealth: Payer: Self-pay | Admitting: Licensed Clinical Social Worker

## 2019-08-08 ENCOUNTER — Other Ambulatory Visit: Payer: Self-pay

## 2019-08-08 NOTE — Telephone Encounter (Signed)
CSW mailed patient a GoodRx card for future discounts on medications. Raquel Sarna, [place], [place]

## 2019-08-31 ENCOUNTER — Telehealth: Payer: Self-pay | Admitting: Cardiology

## 2019-08-31 MED ORDER — DILTIAZEM HCL ER 240 MG PO CP24: 240.0000 mg | ORAL_CAPSULE | Freq: Every day | ORAL | 2 refills | Status: DC

## 2019-08-31 NOTE — Telephone Encounter (Signed)
New Message   *STAT* If patient is at the pharmacy, call can be transferred to refill team.   1. Which medications need to be refilled? (please list name of each medication and dose if known) diltiazem (DILACOR XR) 240 MG 24 hr capsule     albuterol (PROVENTIL HFA;VENTOLIN HFA) 108 (90 Base) MCG/ACT inhaler  2. Which pharmacy/location (including street and city if local pharmacy) is medication to be sent to? [id number] [place] [place] [facility] [address]  3. Do they need a 30 day or 90 day supply? 90 day   Patient states that his insurance will not start until March 2021 and needs refills until then.

## 2019-08-31 NOTE — Telephone Encounter (Signed)
Diltiazem refilled. Albuterol has been refilled by PCP - non-cardiac medication

## 2019-08-31 NOTE — Telephone Encounter (Signed)
Called patient to make him aware that albuterol was last refilled by PCP and is a non-cardiac medication. He states he is aware of this but that Dr. Percival Spanish has refilled in the past. He has no insurance coverage and has been working with Education officer, [employer] on getting his meds. He states PCP sometimes requires an OV in order to refill such PRN medication. Advised will route his request to MD and follow up with him

## 2019-09-01 MED ORDER — ALBUTEROL SULFATE HFA 108 (90 BASE) MCG/ACT IN AERS: 2.0000 | INHALATION_SPRAY | Freq: Four times a day (QID) | RESPIRATORY_TRACT | 8 refills | Status: AC | PRN

## 2019-09-01 NOTE — Telephone Encounter (Signed)
I am OK to renew this.   

## 2019-09-01 NOTE — Telephone Encounter (Signed)
Albuterol refilled per MD

## 2019-09-22 ENCOUNTER — Telehealth: Payer: Self-pay

## 2019-09-22 NOTE — Telephone Encounter (Signed)
calles and lmomed the pt offering a free visit per raquel since we haven't seen him in 2 mos

## 2019-10-10 ENCOUNTER — Other Ambulatory Visit: Payer: Self-pay

## 2019-10-10 ENCOUNTER — Ambulatory Visit (INDEPENDENT_AMBULATORY_CARE_PROVIDER_SITE_OTHER): Payer: Self-pay | Admitting: Pharmacist Clinician (PhC)/ Clinical Pharmacy Specialist

## 2019-10-10 DIAGNOSIS — Z5181 Encounter for therapeutic drug level monitoring: Secondary | ICD-10-CM

## 2019-10-10 LAB — POCT INR: INR: 1.6 — AB (ref 2.0–3.0)

## 2019-11-08 ENCOUNTER — Other Ambulatory Visit: Payer: Self-pay

## 2019-11-08 ENCOUNTER — Ambulatory Visit (INDEPENDENT_AMBULATORY_CARE_PROVIDER_SITE_OTHER): Payer: Self-pay | Admitting: Pharmacist

## 2019-11-08 DIAGNOSIS — Z5181 Encounter for therapeutic drug level monitoring: Secondary | ICD-10-CM

## 2019-11-08 LAB — POCT INR: INR: 1.7 — AB (ref 2.0–3.0)

## 2020-01-03 ENCOUNTER — Other Ambulatory Visit: Payer: Self-pay | Admitting: Pharmacist

## 2020-01-03 MED ORDER — WARFARIN SODIUM 5 MG PO TABS: ORAL_TABLET | ORAL | 0 refills | Status: DC

## 2020-01-16 ENCOUNTER — Other Ambulatory Visit: Payer: Self-pay | Admitting: Cardiology

## 2020-01-30 ENCOUNTER — Other Ambulatory Visit: Payer: Self-pay | Admitting: Cardiology

## 2020-03-04 ENCOUNTER — Other Ambulatory Visit: Payer: Self-pay | Admitting: Cardiology

## 2020-03-06 ENCOUNTER — Ambulatory Visit: Payer: Medicare Other | Admitting: Family Medicine

## 2020-03-14 ENCOUNTER — Other Ambulatory Visit: Payer: Self-pay

## 2020-03-14 DIAGNOSIS — J418 Mixed simple and mucopurulent chronic bronchitis: Secondary | ICD-10-CM

## 2020-03-19 ENCOUNTER — Other Ambulatory Visit: Payer: Self-pay | Admitting: Pulmonary Disease

## 2020-03-19 ENCOUNTER — Other Ambulatory Visit: Payer: Self-pay | Admitting: Primary Care

## 2020-03-19 ENCOUNTER — Telehealth: Payer: Self-pay

## 2020-03-19 DIAGNOSIS — J418 Mixed simple and mucopurulent chronic bronchitis: Secondary | ICD-10-CM

## 2020-03-19 MED ORDER — BUDESONIDE-FORMOTEROL FUMARATE 160-4.5 MCG/ACT IN AERO: 2.0000 | INHALATION_SPRAY | Freq: Two times a day (BID) | RESPIRATORY_TRACT | 5 refills | Status: AC

## 2020-03-19 MED ORDER — BUDESONIDE-FORMOTEROL FUMARATE 160-4.5 MCG/ACT IN AERO: 2.0000 | INHALATION_SPRAY | Freq: Two times a day (BID) | RESPIRATORY_TRACT | 0 refills | Status: DC

## 2020-03-19 NOTE — Telephone Encounter (Signed)
Patient called in wanting to know if he could get a Rx sent into the pharmacy for his budesonide-formoterol [facility]) 160-4.5 MCG/ACT inhaler, albuterol (VENTOLIN HFA) 108 (90 Base) MCG/ACT inhaler     [facility] [address], [place] - [address]    Please advise

## 2020-03-19 NOTE — Telephone Encounter (Signed)
ok 

## 2020-03-19 NOTE — Telephone Encounter (Signed)
Pt was called and told he would need to reach out to Pulm MD who was RXing this medication. Pt verbalized understanding. Pt was told we could not RX this medication as we have not seen him in 2 years. He said he was unsure if Dr Kirtland Bouchard was in Network anymore and was trying to find that out. He will call back to schedule if she is.

## 2020-03-19 NOTE — Telephone Encounter (Signed)
Spoke with pt, he is requesting a refill on Symbicort but he has not seen [place] since 2019. He has been without his Symbicort for 2 months and really needs his refill. I advised him to make an appt with Dr. Isaiah Serge but wanted to make sure we take [facility] Advantage insurance. I sent a message to the front staff to get that information.  I asked Rodell Perna and she states we should take the insurance but pt should call to make sure we are in network. I will advise pt when I call back.   Can someone advise if I can send in one refill for pt? I am not sure if Dr. Isaiah Serge is checking his messages.

## 2020-03-19 NOTE — Telephone Encounter (Signed)
Pt was to est with Pulmonology and they were to take over meds, per last note. Pt needs to be called and told to contact Pulm MD

## 2020-03-29 ENCOUNTER — Encounter: Payer: Self-pay | Admitting: Cardiology

## 2020-03-29 ENCOUNTER — Telehealth (INDEPENDENT_AMBULATORY_CARE_PROVIDER_SITE_OTHER): Payer: Medicare (Managed Care) | Admitting: Cardiology

## 2020-03-29 ENCOUNTER — Telehealth: Payer: Self-pay | Admitting: Cardiology

## 2020-03-29 VITALS — BP 131/104 | HR 78 | Ht 74.0 in

## 2020-03-29 DIAGNOSIS — J449 Chronic obstructive pulmonary disease, unspecified: Secondary | ICD-10-CM

## 2020-03-29 DIAGNOSIS — M4310 Spondylolisthesis, site unspecified: Secondary | ICD-10-CM

## 2020-03-29 DIAGNOSIS — Z9861 Coronary angioplasty status: Secondary | ICD-10-CM

## 2020-03-29 DIAGNOSIS — I482 Chronic atrial fibrillation, unspecified: Secondary | ICD-10-CM | POA: Diagnosis not present

## 2020-03-29 DIAGNOSIS — Z7901 Long term (current) use of anticoagulants: Secondary | ICD-10-CM

## 2020-03-29 DIAGNOSIS — F17210 Nicotine dependence, cigarettes, uncomplicated: Secondary | ICD-10-CM

## 2020-03-29 DIAGNOSIS — I10 Essential (primary) hypertension: Secondary | ICD-10-CM

## 2020-03-29 DIAGNOSIS — Z955 Presence of coronary angioplasty implant and graft: Secondary | ICD-10-CM

## 2020-03-29 DIAGNOSIS — I2510 Atherosclerotic heart disease of native coronary artery without angina pectoris: Secondary | ICD-10-CM

## 2020-03-29 MED ORDER — METOPROLOL SUCCINATE ER 100 MG PO TB24: 100.0000 mg | ORAL_TABLET | Freq: Two times a day (BID) | ORAL | 2 refills | Status: DC

## 2020-03-29 MED ORDER — ATORVASTATIN CALCIUM 80 MG PO TABS: 80.0000 mg | ORAL_TABLET | Freq: Every day | ORAL | 3 refills | Status: DC

## 2020-03-29 MED ORDER — DILTIAZEM HCL ER 240 MG PO CP24: 240.0000 mg | ORAL_CAPSULE | Freq: Every day | ORAL | 2 refills | Status: AC

## 2020-03-29 MED ORDER — AMLODIPINE BESYLATE 5 MG PO TABS: 5.0000 mg | ORAL_TABLET | Freq: Every day | ORAL | 3 refills | Status: DC

## 2020-03-29 MED ORDER — LOSARTAN POTASSIUM 50 MG PO TABS: 50.0000 mg | ORAL_TABLET | Freq: Every day | ORAL | 3 refills | Status: DC

## 2020-03-29 NOTE — Patient Instructions (Signed)
Medication Instructions:  Your physician recommends that you continue on your current medications as directed. Please refer to the Current Medication list given to you today.  *If you need a refill on your cardiac medications before your next appointment, please call your pharmacy*   Lab Work: Your physician recommends that you return for a FASTING lipid profile, CMET, CBC, and INR at your earliest convenience. Please bring your lab slips with you when you come in for blood work. You do not need an appointment to have blood work done in our office.  If you have labs (blood work) drawn today and your tests are completely normal, you will receive your results only by: Marland Kitchen MyChart Message (if you have MyChart) OR . A paper copy in the mail If you have any lab test that is abnormal or we need to change your treatment, we will call you to review the results.    Follow-Up: At [facility], you and your health needs are our priority.  As part of our continuing mission to provide you with exceptional heart care, we have created designated Provider Care Teams.  These Care Teams include your primary Cardiologist (physician) and Advanced Practice Providers (APPs -  Physician Assistants and Nurse Practitioners) who all work together to provide you with the care you need, when you need it.  We recommend signing up for the patient portal called "MyChart".  Sign up information is provided on this After Visit Summary.  MyChart is used to connect with patients for Virtual Visits (Telemedicine).  Patients are able to view lab/test results, encounter notes, upcoming appointments, etc.  Non-urgent messages can be sent to your provider as well.   To learn more about what you can do with MyChart, go to [email or web address].    Your next appointment:   8 week(s)  The format for your next appointment:   In Person  Provider:   You may see Rollene Rotunda, MD or one of the following Advanced Practice Providers  on your designated Care Team:    Corine Shelter, [place]

## 2020-03-29 NOTE — Progress Notes (Signed)
Virtual Visit via Telephone Note   This visit type was conducted due to national recommendations for restrictions regarding the COVID-19 Pandemic (e.g. social distancing) in an effort to limit this patient's exposure and mitigate transmission in our community.  Due to his co-morbid illnesses, this patient is at least at moderate risk for complications without adequate follow up.  This format is felt to be most appropriate for this patient at this time.  The patient did not have access to video technology/had technical difficulties with video requiring transitioning to audio format only (telephone).  All issues noted in this document were discussed and addressed.  No physical exam could be performed with this format.  Please refer to the patient's chart for his  consent to telehealth for [facility]   The patient was identified using 2 identifiers.  Date:  03/29/2020   ID:  [name], DOB 1961-09-04, MRN [id number]  Patient Location: Home Provider Location: Home  PCP:  Natalia Leatherwood, DO  Cardiologist:  Rollene Rotunda, MD  Electrophysiologist:  None   Evaluation Performed:  Follow-Up Visit  Chief Complaint:  none  History of Present Illness:    [name] is a 59 y.o. male with a history of CAF, CAD, HTN, COPD, and non compliance secondary to financial issues.  Patient was initially seen in September 2018 for atrial fibrillation with RVR and subsequent hypotension.  He was placed on anticoagulation and underwent outpatient cardioversion 09/04/2017 but quickly reverted back to atrial fibrillation.  There was a history of binge EtOH, 1 pack a day smoking, and uncontrolled hypertension.  He had also been reportedly drinking a gallon of sweet tea a day.  A sleep study was obtained at that time but did not reveal significant sleep apnea.  Ultimately we opted for rate control and anticoagulation.  He presented in April 2019 with chest pain.  Myoview was abnormal with a large anterior  defect.  Diagnostic catheterization done 03/30/2018 showed a 90% mid circumflex lesion which was treated with a DES.  He had moderate residual nonobstructive RCA disease.  He had problems with chest pain after this, ultimately this was felt to be secondary to COPD.  He followed up with Dr. Isaiah Serge.  He continues to smoke a pack a day.  The patient was last seen in April 2020.  He has had problems with affording his medications.  He stopped coming for his INR because he could not afford it, his last INR was 1.7 in December 2020.  He continues to take warfarin 7.5 mg Monday Wednesday and Friday and 5 mg other days.  He had trouble affording his inhalers though I believe he has got new prescriptions.  He has been able to afford his generic cardiac medications.  His blood pressure is elevated, "but good for me".  He now has Medicare.  He wants to resume his medical care with [place] and this will be arranged.    The patient does not have symptoms concerning for COVID-19 infection (fever, chills, cough, or new shortness of breath).    Past Medical History:  Diagnosis Date  . Adrenal cyst (HCC)   . Anemia    "when I was a little boy" (03/30/2018)  . ARF (acute renal failure) (HCC) 08/13/2017  . Arthritis    "hands" (03/30/2018)  . Atrial fibrillation (HCC)   . Chronic bronchitis (HCC)   . Emphysema of lung (HCC)    "early onset" (03/30/2018)  . High cholesterol   . History  of gout   . History of hiatal hernia   . Hypertension   . Persistent atrial fibrillation with rapid ventricular response ([place]) 08/12/2017  . Pneumothorax ~ 2002  . Positive TB test   . Sleep apnea    pt states CPAP not needed based on results of sleep study  . Tobacco use 1973   Past Surgical History:  Procedure Laterality Date  . ADRENAL GLAND SURGERY     pt reports adrenal gland removed, he is uncertain which side.   Marland Kitchen CARDIAC CATHETERIZATION     pt describes a cardiac cath procedure as being completed, states it was "clear"   . CARDIOVERSION N/A 09/04/2017   Procedure: CARDIOVERSION;  Surgeon: Josue Hector, MD;  Location: [facility] ENDOSCOPY;  Service: Cardiovascular;  Laterality: N/A;  . CHEST TUBE INSERTION  ~ 2002   "collapsed lung"  . CORONARY ANGIOPLASTY WITH STENT PLACEMENT  03/30/2018  . CORONARY STENT INTERVENTION N/A 03/30/2018   Procedure: CORONARY STENT INTERVENTION;  Surgeon: Burnell Blanks, MD;  Location: [place] CV LAB;  Service: Cardiovascular;  Laterality: N/A;  . ESOPHAGOGASTRODUODENOSCOPY     pt states it was abnormal, but he does not know why  . FINGER SURGERY Right    "severed all the tendons in my right thumb; did OR to reattach"  . INGUINAL HERNIA REPAIR Left 1996  . LEFT HEART CATH AND CORONARY ANGIOGRAPHY N/A 03/30/2018   Procedure: LEFT HEART CATH AND CORONARY ANGIOGRAPHY;  Surgeon: Burnell Blanks, MD;  Location: [place] CV LAB;  Service: Cardiovascular;  Laterality: N/A;  . NASAL HEMORRHAGE CONTROL N/A 02/22/2016   Procedure: EPISTAXIS CONTROL;  Surgeon: Izora Gala, MD;  Location: [place];  Service: ENT;  Laterality: N/A;     Current Meds  Medication Sig  . acetaminophen (TYLENOL) 500 MG tablet Take 1 tablet (500 mg total) by mouth daily as needed for moderate pain or headache.  . albuterol (VENTOLIN HFA) 108 (90 Base) MCG/ACT inhaler Inhale 2 puffs into the lungs every 6 (six) hours as needed for wheezing or shortness of breath.  Marland Kitchen atorvastatin (LIPITOR) 80 MG tablet Take 1 tablet (80 mg total) by mouth daily at 6 PM. Please make annual appt in April for refills. Thank you  . budesonide-formoterol (SYMBICORT) 160-4.5 MCG/ACT inhaler Inhale 2 puffs into the lungs 2 (two) times daily.  Marland Kitchen diltiazem (DILACOR XR) 240 MG 24 hr capsule Take 1 capsule (240 mg total) by mouth daily.  Marland Kitchen ePHEDrine-GuaiFENesin (BRONKAID) 25-400 MG TABS Take 1 tablet by mouth daily as needed (for shortness of breath/congestion).   Marland Kitchen losartan (COZAAR) 50 MG tablet Take 1 tablet (50 mg total) by  mouth daily.  . metoprolol succinate (TOPROL-XL) 100 MG 24 hr tablet Take 1 tablet (100 mg total) by mouth 2 (two) times daily. Take with or immediately following a meal.  . nitroGLYCERIN (NITROSTAT) 0.4 MG SL tablet Place 1 tablet (0.4 mg total) under the tongue every 5 (five) minutes as needed for chest pain.  . pantoprazole (PROTONIX) 40 MG tablet Take 1 tablet (40 mg total) by mouth daily.  Marland Kitchen warfarin (COUMADIN) 5 MG tablet TAKE 1 TO 1.5 TABLETS BY MOUTH DAILY AS DIRECTED.**NEED INR FOR FURTHER REILLS     Allergies:   Patient has no known allergies.   Social History   Tobacco Use  . Smoking status: Current Every Day Smoker    Packs/day: 1.00    Years: 45.00    Pack years: 45.00    Types: Cigarettes  .  Smokeless tobacco: Never Used  Substance Use Topics  . Alcohol use: Yes    Alcohol/week: 6.0 standard drinks    Types: 6 Cans of beer per week  . Drug use: No     Family Hx: The patient's family history includes Alcohol abuse in his father; Arthritis in his brother and mother; Early death in his father; Heart disease in his brother; Lymphoma in his brother; Mental illness in his father.  ROS:   Please see the history of present illness.    All other systems reviewed and are negative.   Prior CV studies:   The following studies were reviewed today: Cath/PCI 2018  Labs/Other Tests and Data Reviewed:    EKG:  An ECG dated 11/26/2018 was personally reviewed today and demonstrated:  AF with VR 100- low voltage  Recent Labs: No results found for requested labs within last 8760 hours.   Recent Lipid Panel No results found for: CHOL, TRIG, HDL, CHOLHDL, LDLCALC, LDLDIRECT  Wt Readings from Last 3 Encounters:  11/26/18 264 lb (119.7 kg)  08/25/18 266 lb (120.7 kg)  07/13/18 262 lb (118.8 kg)     Objective:    Vital Signs:  BP (!) 131/104   Pulse 78   Ht 6\' 2"  (1.88 m)   BMI 33.90 kg/m    VITAL SIGNS:  reviewed  ASSESSMENT & PLAN:    Uncontrolled HTN- I would  like to check his labs before making any adjustments- his K+ was 5.4 in 2019 on ARB.  CAD- S/p CFX PCI 2019- chronic chest pain felt to be secondary to COPD.  COPD- He will reach out to Dr 2020 now that he has insurance.   CAF- Rate control-  Chronic anticoagulation- CHADS VASC-2.   if he cannot be switched to a NOAC I'm not sure he is a candidate for warfarin unless he commits to regular INR checks.  I'll reach out to pharmacy for recommendations.   Plan:  Check labs- CMET, CBC, INR,lipids.  OV 6-8 weeks. Will ask pharmacy about options for [facility].   COVID-19 Education: The signs and symptoms of COVID-19 were discussed with the patient and how to seek care for testing (follow up with PCP or arrange E-visit).  The importance of social distancing was discussed today.  Time:   Today, I have spent 20 minutes with the patient with telehealth technology discussing the above problems.     Medication Adjustments/Labs and Tests Ordered: Current medicines are reviewed at length with the patient today.  Concerns regarding medicines are outlined above.   Tests Ordered: No orders of the defined types were placed in this encounter.   Medication Changes: No orders of the defined types were placed in this encounter.   Follow Up:  In Person f/u with me 6-8 weeks - in office  Signed, [facility], Corine Shelter  03/29/2020 11:09 AM    [facility]

## 2020-03-29 NOTE — Addendum Note (Signed)
Addended by: Evans Lance on: 03/29/2020 12:05 PM   Modules accepted: Orders

## 2020-03-29 NOTE — Telephone Encounter (Signed)
-----   Message from Evans Lance sent at 03/29/2020 12:08 PM EDT ----- Regarding: F/U appt Please call pt per Corine Shelter, PA to schedule an 8 week office visit with Dr. Antoine Poche or Franky Macho.  Thanks, Ladona Ridgel, CMA

## 2020-03-29 NOTE — Telephone Encounter (Signed)
Called patient to schedule appt as below.  He was upset because he thought I was to called him back in 5 min after he hung up with Corine Shelter.  I advised him that was not me.  He stated he was to set up an appt with the nurse so he can get his medications.  He wants to speak to a nurse and refused to set up a follow up appt.

## 2020-03-29 NOTE — Telephone Encounter (Signed)
Prescriptions refilled per patient request at preferred pharmacy. Pharm D appt made for INR check and to discuss anticoagulation options. Follow up made with Corine Shelter for 6/21.

## 2020-04-05 ENCOUNTER — Ambulatory Visit: Payer: Medicare (Managed Care)

## 2020-04-13 ENCOUNTER — Other Ambulatory Visit: Payer: Self-pay | Admitting: Cardiology

## 2020-04-16 ENCOUNTER — Other Ambulatory Visit: Payer: Self-pay | Admitting: Cardiology

## 2020-04-25 ENCOUNTER — Other Ambulatory Visit: Payer: Self-pay

## 2020-04-25 MED ORDER — PANTOPRAZOLE SODIUM 40 MG PO TBEC: 40.0000 mg | DELAYED_RELEASE_TABLET | Freq: Every day | ORAL | 3 refills | Status: AC

## 2020-04-25 MED ORDER — ATORVASTATIN CALCIUM 80 MG PO TABS: 80.0000 mg | ORAL_TABLET | Freq: Every day | ORAL | 3 refills | Status: AC

## 2020-05-28 ENCOUNTER — Ambulatory Visit: Payer: Medicare (Managed Care) | Admitting: Cardiology

## 2020-05-29 ENCOUNTER — Ambulatory Visit: Payer: Self-pay | Admitting: Pharmacist

## 2020-06-06 ENCOUNTER — Telehealth: Payer: Self-pay | Admitting: Cardiology

## 2020-06-06 MED ORDER — WARFARIN SODIUM 5 MG PO TABS: ORAL_TABLET | ORAL | 0 refills | Status: DC

## 2020-06-06 NOTE — Telephone Encounter (Signed)
New message    *STAT* If patient is at the pharmacy, call can be transferred to refill team.   1. Which medications need to be refilled? (please list name of each medication and dose if known) warfarin (COUMADIN) 5 MG tablet  2. Which pharmacy/location (including street and city if local pharmacy) [facility] [address], [place], [address]  3. Do they need a 30 day or 90 day supply?90

## 2020-06-12 ENCOUNTER — Other Ambulatory Visit: Payer: Self-pay | Admitting: Cardiology

## 2020-06-20 ENCOUNTER — Ambulatory Visit (INDEPENDENT_AMBULATORY_CARE_PROVIDER_SITE_OTHER): Payer: Medicare (Managed Care)

## 2020-06-20 ENCOUNTER — Other Ambulatory Visit: Payer: Self-pay

## 2020-06-20 DIAGNOSIS — Z7901 Long term (current) use of anticoagulants: Secondary | ICD-10-CM | POA: Diagnosis not present

## 2020-06-20 DIAGNOSIS — I4819 Other persistent atrial fibrillation: Secondary | ICD-10-CM | POA: Diagnosis not present

## 2020-06-20 DIAGNOSIS — Z5181 Encounter for therapeutic drug level monitoring: Secondary | ICD-10-CM | POA: Diagnosis not present

## 2020-06-20 DIAGNOSIS — I4891 Unspecified atrial fibrillation: Secondary | ICD-10-CM

## 2020-06-20 LAB — POCT INR: INR: 1.7 — AB (ref 2.0–3.0)

## 2020-06-20 MED ORDER — WARFARIN SODIUM 5 MG PO TABS: ORAL_TABLET | ORAL | 0 refills | Status: DC

## 2020-06-20 NOTE — Patient Instructions (Signed)
Take 2 tablets today and then continue taking 1 tablet daily except 1.5 tablets on Monday, Wednesday and Friday.  Repeat INR in 3 weeks.

## 2020-06-21 ENCOUNTER — Other Ambulatory Visit: Payer: Self-pay | Admitting: Cardiology

## 2020-07-12 ENCOUNTER — Telehealth: Payer: Self-pay

## 2020-07-12 NOTE — Telephone Encounter (Signed)
lmom for missed inr 

## 2020-07-30 ENCOUNTER — Telehealth: Payer: Self-pay

## 2020-07-30 NOTE — Telephone Encounter (Signed)
LMOM FOR INR CHECK

## 2020-07-31 ENCOUNTER — Telehealth: Payer: Self-pay

## 2020-07-31 NOTE — Telephone Encounter (Signed)
Unable to lmom for overdue inr 

## 2020-08-06 ENCOUNTER — Other Ambulatory Visit: Payer: Self-pay

## 2020-08-06 MED ORDER — WARFARIN SODIUM 5 MG PO TABS: ORAL_TABLET | ORAL | 0 refills | Status: DC

## 2020-08-08 ENCOUNTER — Other Ambulatory Visit: Payer: Self-pay

## 2020-08-08 MED ORDER — WARFARIN SODIUM 5 MG PO TABS: ORAL_TABLET | ORAL | 0 refills | Status: AC

## 2020-08-22 ENCOUNTER — Encounter: Payer: Self-pay | Admitting: Pulmonary Disease

## 2020-08-27 ENCOUNTER — Ambulatory Visit: Payer: Medicare (Managed Care) | Admitting: Pulmonary Disease

## 2020-11-06 ENCOUNTER — Ambulatory Visit: Payer: Medicare (Managed Care) | Admitting: Pulmonary Disease

## 2021-04-24 ENCOUNTER — Other Ambulatory Visit: Payer: Self-pay | Admitting: Family Medicine

## 2021-04-24 ENCOUNTER — Other Ambulatory Visit: Payer: Self-pay | Admitting: Orthopedic Surgery

## 2021-04-24 DIAGNOSIS — N509 Disorder of male genital organs, unspecified: Secondary | ICD-10-CM

## 2021-04-25 ENCOUNTER — Ambulatory Visit
Admission: RE | Admit: 2021-04-25 | Discharge: 2021-04-25 | Disposition: A | Discharge status: Home / Self Care | Payer: Medicare (Managed Care) | Source: Ambulatory Visit | Attending: Family Medicine | Admitting: Family Medicine

## 2021-04-25 DIAGNOSIS — N509 Disorder of male genital organs, unspecified: Secondary | ICD-10-CM
